# Patient Record
Sex: Female | Born: 1940 | Race: White | Hispanic: No | Marital: Married | Smoking: Former smoker
Health system: Southern US, Community
[De-identification: ages and names within clinical notes are randomized; demographics above are authoritative.]

## PROBLEM LIST (undated history)

## (undated) DIAGNOSIS — F329 Major depressive disorder, single episode, unspecified: Secondary | ICD-10-CM

## (undated) DIAGNOSIS — I509 Heart failure, unspecified: Secondary | ICD-10-CM

## (undated) DIAGNOSIS — M797 Fibromyalgia: Secondary | ICD-10-CM

## (undated) DIAGNOSIS — F32A Depression, unspecified: Secondary | ICD-10-CM

## (undated) DIAGNOSIS — Z9981 Dependence on supplemental oxygen: Secondary | ICD-10-CM

## (undated) DIAGNOSIS — K219 Gastro-esophageal reflux disease without esophagitis: Secondary | ICD-10-CM

## (undated) DIAGNOSIS — K222 Esophageal obstruction: Secondary | ICD-10-CM

## (undated) DIAGNOSIS — Z8719 Personal history of other diseases of the digestive system: Secondary | ICD-10-CM

## (undated) DIAGNOSIS — J449 Chronic obstructive pulmonary disease, unspecified: Secondary | ICD-10-CM

## (undated) HISTORY — PX: VARICOSE VEIN SURGERY: SHX832

## (undated) HISTORY — DX: Chronic obstructive pulmonary disease, unspecified: J44.9

## (undated) HISTORY — DX: Fibromyalgia: M79.7

## (undated) HISTORY — DX: Gastro-esophageal reflux disease without esophagitis: K21.9

## (undated) HISTORY — DX: Major depressive disorder, single episode, unspecified: F32.9

## (undated) HISTORY — DX: Esophageal obstruction: K22.2

## (undated) HISTORY — DX: Depression, unspecified: F32.A

## (undated) HISTORY — DX: Heart failure, unspecified: I50.9

---
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
## 1996-01-26 HISTORY — PX: CATARACT EXTRACTION: SUR2

## 1997-01-25 HISTORY — PX: OTHER SURGICAL HISTORY: SHX169

## 1999-01-26 HISTORY — PX: NEUROMA SURGERY: SHX722

## 2000-01-26 HISTORY — PX: OTHER SURGICAL HISTORY: SHX169

## 2001-01-25 HISTORY — PX: OTHER SURGICAL HISTORY: SHX169

## 2003-01-26 HISTORY — PX: OTHER SURGICAL HISTORY: SHX169

## 2003-07-16 ENCOUNTER — Other Ambulatory Visit: Payer: Self-pay

## 2004-04-23 ENCOUNTER — Ambulatory Visit: Payer: Self-pay

## 2005-04-30 ENCOUNTER — Ambulatory Visit: Payer: Self-pay

## 2005-05-26 ENCOUNTER — Ambulatory Visit: Payer: Self-pay

## 2005-07-15 ENCOUNTER — Ambulatory Visit: Payer: Self-pay

## 2007-01-09 ENCOUNTER — Other Ambulatory Visit: Payer: Self-pay

## 2007-01-09 ENCOUNTER — Ambulatory Visit: Payer: Self-pay | Admitting: Unknown Physician Specialty

## 2007-01-18 ENCOUNTER — Ambulatory Visit: Payer: Self-pay | Admitting: Physician Assistant

## 2007-01-24 ENCOUNTER — Ambulatory Visit: Payer: Self-pay | Admitting: Unknown Physician Specialty

## 2007-02-15 ENCOUNTER — Ambulatory Visit: Payer: Self-pay | Admitting: Unknown Physician Specialty

## 2007-02-21 ENCOUNTER — Encounter: Payer: Self-pay | Admitting: Unknown Physician Specialty

## 2007-02-26 ENCOUNTER — Encounter: Payer: Self-pay | Admitting: Unknown Physician Specialty

## 2007-03-16 ENCOUNTER — Ambulatory Visit: Payer: Self-pay | Admitting: Internal Medicine

## 2007-03-26 ENCOUNTER — Encounter: Payer: Self-pay | Admitting: Unknown Physician Specialty

## 2007-04-26 ENCOUNTER — Encounter: Payer: Self-pay | Admitting: Unknown Physician Specialty

## 2007-06-03 ENCOUNTER — Other Ambulatory Visit: Payer: Self-pay

## 2007-06-03 ENCOUNTER — Inpatient Hospital Stay: Payer: Self-pay | Admitting: Internal Medicine

## 2007-06-28 ENCOUNTER — Ambulatory Visit: Payer: Self-pay | Admitting: Family Medicine

## 2007-06-30 ENCOUNTER — Ambulatory Visit: Payer: Self-pay | Admitting: Nephrology

## 2007-07-06 ENCOUNTER — Ambulatory Visit: Payer: Self-pay | Admitting: Oncology

## 2007-07-19 LAB — IRON AND TIBC
%SAT: 22 % (ref 20–55)
Iron: 67 ug/dL (ref 42–145)
TIBC: 309 ug/dL (ref 250–470)
UIBC: 242 ug/dL

## 2007-07-19 LAB — VITAMIN B12: Vitamin B-12: 735 pg/mL (ref 211–911)

## 2007-07-19 LAB — KAPPA/LAMBDA LIGHT CHAINS: Lambda Free Lght Chn: 2.01 mg/dL (ref 0.57–2.63)

## 2007-07-21 LAB — UIFE/LIGHT CHAINS/TP QN, 24-HR UR
Alpha 2, Urine: DETECTED — AB
Beta, Urine: DETECTED — AB
Free Kappa Lt Chains,Ur: 5.84 mg/dL — ABNORMAL HIGH (ref 0.04–1.51)
Free Lambda Lt Chains,Ur: 0.64 mg/dL (ref 0.08–1.01)
Free Lt Chn Excr Rate: 75.92 mg/d
Gamma Globulin, Urine: DETECTED — AB
Total Protein, Urine-Ur/day: 90 mg/d (ref 10–140)
Volume, Urine: 1300 mL

## 2007-07-24 ENCOUNTER — Ambulatory Visit: Payer: Self-pay | Admitting: Oncology

## 2007-08-15 LAB — CBC WITH DIFFERENTIAL (CANCER CENTER ONLY)
Eosinophils Absolute: 0.2 10*3/uL (ref 0.0–0.5)
HGB: 14.8 g/dL (ref 11.6–15.9)
LYMPH#: 2.8 10*3/uL (ref 0.9–3.3)
MONO#: 0.7 10*3/uL (ref 0.1–0.9)
MONO%: 6.5 % (ref 0.0–13.0)
NEUT#: 6.3 10*3/uL (ref 1.5–6.5)
Platelets: 286 10*3/uL (ref 145–400)
RBC: 5.24 10*6/uL (ref 3.70–5.32)
WBC: 10 10*3/uL (ref 3.9–10.0)

## 2007-09-01 ENCOUNTER — Ambulatory Visit: Payer: Self-pay | Admitting: Oncology

## 2007-09-04 ENCOUNTER — Other Ambulatory Visit: Admission: RE | Admit: 2007-09-04 | Discharge: 2007-09-04 | Payer: Self-pay | Admitting: Oncology

## 2007-09-04 ENCOUNTER — Encounter: Payer: Self-pay | Admitting: Oncology

## 2007-09-04 LAB — CBC WITH DIFFERENTIAL (CANCER CENTER ONLY)
BASO#: 0.1 10*3/uL (ref 0.0–0.2)
BASO%: 1.3 % (ref 0.0–2.0)
EOS%: 2.8 % (ref 0.0–7.0)
HGB: 15 g/dL (ref 11.6–15.9)
LYMPH#: 2.3 10*3/uL (ref 0.9–3.3)
MCH: 28.2 pg (ref 26.0–34.0)
MCHC: 34.1 g/dL (ref 32.0–36.0)
MONO%: 7.7 % (ref 0.0–13.0)
NEUT#: 4.3 10*3/uL (ref 1.5–6.5)
NEUT%: 57.8 % (ref 39.6–80.0)
RDW: 15.9 % — ABNORMAL HIGH (ref 10.5–14.6)

## 2007-10-09 LAB — CBC WITH DIFFERENTIAL (CANCER CENTER ONLY)
BASO%: 0.9 % (ref 0.0–2.0)
EOS%: 3.3 % (ref 0.0–7.0)
LYMPH#: 3.3 10*3/uL (ref 0.9–3.3)
MCH: 29.4 pg (ref 26.0–34.0)
MCHC: 34.4 g/dL (ref 32.0–36.0)
MONO%: 7.5 % (ref 0.0–13.0)
NEUT#: 4.1 10*3/uL (ref 1.5–6.5)
Platelets: 267 10*3/uL (ref 145–400)
RBC: 5.1 10*6/uL (ref 3.70–5.32)

## 2007-10-11 ENCOUNTER — Ambulatory Visit: Payer: Self-pay | Admitting: Family Medicine

## 2007-11-04 ENCOUNTER — Ambulatory Visit: Payer: Self-pay | Admitting: Family Medicine

## 2007-11-21 ENCOUNTER — Encounter: Payer: Self-pay | Admitting: Neurosurgery

## 2007-11-26 ENCOUNTER — Encounter: Payer: Self-pay | Admitting: Neurosurgery

## 2007-12-28 ENCOUNTER — Encounter
Admission: RE | Admit: 2007-12-28 | Discharge: 2007-12-28 | Payer: Self-pay | Admitting: Physical Medicine and Rehabilitation

## 2008-01-26 HISTORY — PX: SHOULDER SURGERY: SHX246

## 2008-03-19 ENCOUNTER — Ambulatory Visit: Payer: Self-pay | Admitting: Neurosurgery

## 2008-03-29 ENCOUNTER — Ambulatory Visit: Payer: Self-pay | Admitting: Unknown Physician Specialty

## 2008-04-11 ENCOUNTER — Encounter: Admission: RE | Admit: 2008-04-11 | Discharge: 2008-04-11 | Payer: Self-pay | Admitting: Rheumatology

## 2008-05-01 ENCOUNTER — Ambulatory Visit: Payer: Self-pay | Admitting: Oncology

## 2008-05-03 LAB — CBC WITH DIFFERENTIAL (CANCER CENTER ONLY)
BASO#: 0.1 10*3/uL (ref 0.0–0.2)
BASO%: 1.2 % (ref 0.0–2.0)
EOS%: 2 % (ref 0.0–7.0)
HCT: 41 % (ref 34.8–46.6)
HGB: 13.9 g/dL (ref 11.6–15.9)
LYMPH#: 2.7 10*3/uL (ref 0.9–3.3)
LYMPH%: 30.4 % (ref 14.0–48.0)
MCH: 30.1 pg (ref 26.0–34.0)
MCHC: 33.8 g/dL (ref 32.0–36.0)
MCV: 89 fL (ref 81–101)
NEUT%: 59 % (ref 39.6–80.0)
RDW: 12.9 % (ref 10.5–14.6)

## 2008-05-03 LAB — CMP (CANCER CENTER ONLY)
ALT(SGPT): 16 U/L (ref 10–47)
Alkaline Phosphatase: 129 U/L — ABNORMAL HIGH (ref 26–84)
Creat: 0.8 mg/dl (ref 0.6–1.2)
Sodium: 138 mEq/L (ref 128–145)
Total Bilirubin: 0.5 mg/dl (ref 0.20–1.60)
Total Protein: 7 g/dL (ref 6.4–8.1)

## 2008-05-07 LAB — SPEP & IFE WITH QIG
Albumin ELP: 56.8 % (ref 55.8–66.1)
Alpha-2-Globulin: 13.7 % — ABNORMAL HIGH (ref 7.1–11.8)
Beta Globulin: 6.9 % (ref 4.7–7.2)
Total Protein, Serum Electrophoresis: 6.8 g/dL (ref 6.0–8.3)

## 2008-05-07 LAB — UIFE/LIGHT CHAINS/TP QN, 24-HR UR
Free Kappa Lt Chains,Ur: 2.92 mg/dL — ABNORMAL HIGH (ref 0.04–1.51)
Free Kappa/Lambda Ratio: 4.23 ratio — ABNORMAL HIGH (ref 0.46–4.00)
Free Lambda Excretion/Day: 4.49 mg/d
Free Lambda Lt Chains,Ur: 0.69 mg/dL (ref 0.08–1.01)
Time: 24 hours
Total Protein, Urine: 4.1 mg/dL
Volume, Urine: 650 mL

## 2008-06-03 ENCOUNTER — Encounter: Payer: Self-pay | Admitting: Rheumatology

## 2008-10-25 ENCOUNTER — Encounter: Payer: Self-pay | Admitting: Orthopaedic Surgery

## 2008-10-30 ENCOUNTER — Ambulatory Visit: Payer: Self-pay | Admitting: Gastroenterology

## 2008-11-25 ENCOUNTER — Encounter: Payer: Self-pay | Admitting: Orthopaedic Surgery

## 2008-12-25 ENCOUNTER — Encounter: Payer: Self-pay | Admitting: Orthopaedic Surgery

## 2009-01-25 ENCOUNTER — Encounter: Payer: Self-pay | Admitting: Orthopaedic Surgery

## 2009-04-02 ENCOUNTER — Ambulatory Visit: Payer: Self-pay | Admitting: Physician Assistant

## 2009-04-25 ENCOUNTER — Ambulatory Visit: Payer: Self-pay | Admitting: Oncology

## 2009-06-13 ENCOUNTER — Ambulatory Visit: Payer: Self-pay | Admitting: Oncology

## 2009-06-19 LAB — CBC WITH DIFFERENTIAL/PLATELET
Eosinophils Absolute: 0.2 10*3/uL (ref 0.0–0.5)
LYMPH%: 29.9 % (ref 14.0–49.7)
MONO#: 0.7 10*3/uL (ref 0.1–0.9)
NEUT#: 5.5 10*3/uL (ref 1.5–6.5)
Platelets: 265 10*3/uL (ref 145–400)
RBC: 5.23 10*6/uL (ref 3.70–5.45)
RDW: 15.1 % — ABNORMAL HIGH (ref 11.2–14.5)
WBC: 9.2 10*3/uL (ref 3.9–10.3)
lymph#: 2.8 10*3/uL (ref 0.9–3.3)

## 2009-06-19 LAB — CMP (CANCER CENTER ONLY)
ALT(SGPT): 23 U/L (ref 10–47)
CO2: 19 mEq/L (ref 18–33)
Calcium: 9.2 mg/dL (ref 8.0–10.3)
Chloride: 102 mEq/L (ref 98–108)
Potassium: 3.8 mEq/L (ref 3.3–4.7)
Sodium: 134 mEq/L (ref 128–145)
Total Protein: 7.2 g/dL (ref 6.4–8.1)

## 2009-06-24 LAB — SPEP & IFE WITH QIG
Alpha-2-Globulin: 10.9 % (ref 7.1–11.8)
IgG (Immunoglobin G), Serum: 1180 mg/dL (ref 694–1618)
Total Protein, Serum Electrophoresis: 7 g/dL (ref 6.0–8.3)

## 2009-06-24 LAB — BETA 2 MICROGLOBULIN, SERUM: Beta-2 Microglobulin: 2.12 mg/L — ABNORMAL HIGH (ref 1.01–1.73)

## 2009-06-26 LAB — UIFE/LIGHT CHAINS/TP QN, 24-HR UR
Albumin, U: DETECTED
Free Kappa/Lambda Ratio: 8.6 ratio — ABNORMAL HIGH (ref 0.46–4.00)
Free Lambda Excretion/Day: 8.42 mg/d
Free Lambda Lt Chains,Ur: 0.91 mg/dL (ref 0.08–1.01)
Time: 24 hours
Total Protein, Urine-Ur/day: 89 mg/d (ref 10–140)
Total Protein, Urine: 9.6 mg/dL
Volume, Urine: 925 mL

## 2009-09-10 ENCOUNTER — Ambulatory Visit: Payer: Self-pay | Admitting: Gastroenterology

## 2009-09-12 LAB — PATHOLOGY REPORT

## 2010-05-19 ENCOUNTER — Ambulatory Visit: Payer: Self-pay | Admitting: Family Medicine

## 2010-05-22 ENCOUNTER — Ambulatory Visit: Payer: Self-pay | Admitting: Family Medicine

## 2010-10-23 LAB — CHROMOSOME ANALYSIS, BONE MARROW

## 2010-10-23 LAB — BONE MARROW EXAM

## 2010-10-23 LAB — TISSUE HYBRIDIZATION (BONE MARROW)-NCBH

## 2010-12-02 ENCOUNTER — Ambulatory Visit: Payer: Self-pay | Admitting: Gastroenterology

## 2010-12-07 ENCOUNTER — Ambulatory Visit: Payer: Self-pay | Admitting: Family Medicine

## 2011-05-04 ENCOUNTER — Ambulatory Visit: Payer: Self-pay

## 2011-05-11 ENCOUNTER — Ambulatory Visit: Payer: Self-pay | Admitting: Family Medicine

## 2011-06-07 ENCOUNTER — Encounter: Payer: Self-pay | Admitting: Emergency Medicine

## 2011-06-08 ENCOUNTER — Encounter: Payer: Self-pay | Admitting: Emergency Medicine

## 2011-06-08 ENCOUNTER — Ambulatory Visit (INDEPENDENT_AMBULATORY_CARE_PROVIDER_SITE_OTHER): Payer: BC Managed Care – PPO | Admitting: Emergency Medicine

## 2011-06-08 ENCOUNTER — Ambulatory Visit (INDEPENDENT_AMBULATORY_CARE_PROVIDER_SITE_OTHER)
Admission: RE | Admit: 2011-06-08 | Discharge: 2011-06-08 | Disposition: A | Discharge status: Home / Self Care | Payer: BC Managed Care – PPO | Source: Ambulatory Visit | Attending: Emergency Medicine | Admitting: Emergency Medicine

## 2011-06-08 VITALS — BP 154/76 | HR 92 | Temp 98.6°F | Ht 59.0 in | Wt 149.2 lb

## 2011-06-08 DIAGNOSIS — R0609 Other forms of dyspnea: Secondary | ICD-10-CM

## 2011-06-08 NOTE — Patient Instructions (Signed)
We will start Spiriva once daily Continue to take your albuterol 4x a day for now. We may change this in the future We will perform pulmonary testing at [facility]  We will arrange for you to follow up with Dr Kendrick Fries in [place] (30 minute appointment please)

## 2011-06-08 NOTE — Assessment & Plan Note (Signed)
Suspect this is largely due to COPD with associated hypoxemia. She carries a hx of "CHF" and had a CXR 04/2011 at [facility] that has ? ILD. Significance of these factors is unclear. Certainly we can do more for her w regard to her BD's. Important issue will be wearing O2 - she is reluctant to do so. - Start Spiriva and give samples - CXR today, doubt significant ILD based on clear exam - she needs to consider ordering O2, will have her readdress next visit - wants to follow up in [place], I will set her PFT at [facility] and have her see Dr Kendrick Fries.

## 2011-06-08 NOTE — Progress Notes (Signed)
  Subjective:    Patient ID: Kathryn Ruiz, female    DOB: July 13, 1940, 72 y.o.   MRN: [id number]  HPI 62 former heavy smoker (150pk-yrs), hx GERD, COPD, ? HTN and CHF (per notes). She is referred by Dr Venora Maples for dyspnea with exertion, cough (often affected by GERD). A CXR performed 05/11/11 at [place] showed "B diffuse interstitial thickening."  She has had 2-3 exacerbations in the past, last one was 04/2011. She follows with ENT Dr Jenne Campus for laryngitis, voice hoarseness. Has been on pulmicort + albuterol 4x a day. No PFT's. She is hypoxic on presentation today, she has been given O2 before but has refused it, turned it back in.   Review of Systems  Constitutional: Negative for fever and unexpected weight change.  HENT: Positive for congestion, sore throat, trouble swallowing, postnasal drip and sinus pressure. Negative for ear pain, nosebleeds, rhinorrhea, sneezing and dental problem.   Eyes: Negative for redness and itching.  Respiratory: Positive for cough, shortness of breath and wheezing. Negative for chest tightness.   Cardiovascular: Negative for palpitations and leg swelling.  Gastrointestinal: Negative for nausea and vomiting.  Genitourinary: Negative for dysuria.  Musculoskeletal: Positive for joint swelling.  Skin: Negative for rash.  Neurological: Positive for headaches.  Hematological: Bruises/bleeds easily.  Psychiatric/Behavioral: Negative for dysphoric mood. The patient is nervous/anxious.     Past Medical History  Diagnosis Date  . Fibromyalgia   . GERD (gastroesophageal reflux disease)   . COPD (chronic obstructive pulmonary disease)   . CHF (congestive heart failure)      Family History  Problem Relation Age of Onset  . Heart attack Father 34     History   Social History  . Marital Status: Married    Spouse Name: N/A    Number of Children: 5  . Years of Education: N/A   Occupational History  . Retired     Electrical engineer   Social History Main  Topics  . Smoking status: Former Smoker -- 3.0 packs/day for 50 years    Types: Cigarettes    Quit date: 01/26/1999  . Smokeless tobacco: Never Used  . Alcohol Use: No  . Drug Use: No  . Sexually Active: Not on file   Other Topics Concern  . Not on file   Social History Narrative  . No narrative on file     No Known Allergies   Outpatient Prescriptions Prior to Visit  Medication Sig Dispense Refill  . budesonide (PULMICORT) 180 MCG/ACT inhaler Inhale 2 puffs into the lungs 2 (two) times daily.      Marland Kitchen FLUoxetine (PROZAC) 20 MG tablet Take 20 mg by mouth daily.           Objective:   Physical Exam  Gen: Pleasant, well-nourished, in no distress,  normal affect  ENT: No lesions,  mouth clear,  oropharynx clear, no postnasal drip  Neck: No JVD, no TMG, no carotid bruits  Lungs: No use of accessory muscles, no dullness to percussion, clear without rales or rhonchi  Cardiovascular: RRR, heart sounds normal, no murmur or gallops, no peripheral edema  Musculoskeletal: No deformities, no cyanosis or clubbing  Neuro: alert, non focal  Skin: Warm, no lesions or rashes     Assessment & Plan:

## 2011-06-10 ENCOUNTER — Ambulatory Visit: Payer: Self-pay

## 2011-06-10 LAB — PULMONARY FUNCTION TEST

## 2011-07-09 ENCOUNTER — Encounter: Payer: Self-pay | Admitting: Pulmonary Disease

## 2011-07-09 ENCOUNTER — Ambulatory Visit (INDEPENDENT_AMBULATORY_CARE_PROVIDER_SITE_OTHER): Payer: BC Managed Care – PPO | Admitting: Pulmonary Disease

## 2011-07-09 VITALS — BP 126/72 | HR 85 | Temp 98.4°F | Ht 59.0 in | Wt 148.0 lb

## 2011-07-09 DIAGNOSIS — J449 Chronic obstructive pulmonary disease, unspecified: Secondary | ICD-10-CM

## 2011-07-09 DIAGNOSIS — J8410 Pulmonary fibrosis, unspecified: Secondary | ICD-10-CM | POA: Insufficient documentation

## 2011-07-09 NOTE — Patient Instructions (Signed)
Use 2 liters of oxygen when you walk on a regular basis. We will order an overnight oxygen saturation test to see if you need oxygen at night Keep taking spiriva. Get a flu shot every year. We will send you for a CT scan of your chest at [facility] to evaluate your pulmonary fibrosis You need to see your gastroenterologist again to evaluate your esophagus Take your prilosec twice per day. Follow the GERD diet recommendations we gave you in clinic. We will see you back in one month, overbook OK.

## 2011-07-09 NOTE — Assessment & Plan Note (Signed)
COPD: GOLD Grade A Combined recommendations from the [employer]	[place], [employer], Designer, television/film set, European Respiratory Society (Qaseem A et al, Ann Intern Med. 2011;155(3):179) recommends tobacco cessation, pulmonary rehab (for symptomatic patients with an FEV1 < 50% predicted), supplemental oxygen (for patients with SaO2 <88% or paO2 <55), and appropriate bronchodilator therapy.  In regards to long acting bronchodilators, they recommend monotherapy (FEV1 60-80% with symptoms weak evidence, FEV1 with symptoms <60% strong evidence), or combination therapy (FEV1 <60% with symptoms, strong recommendation, moderate evidence).  One should also provide patients with annual immunizations and consider therapy for prevention of COPD exacerbations (ie. roflumilast or azithromycin) when appopriate.  -O2 therapy: started 3 L with exercise and ordered overnight sleep study -Immunizations: UTD -Tobacco use: non smoker -Exercise: encouraged to exercise at length -Bronchodilator therapy: continue spiriva -Exacerbation prevention: spiriva

## 2011-07-09 NOTE — Progress Notes (Signed)
Subjective:    Patient ID: Kathryn Ruiz, female    DOB: Aug 31, 1940, 71 y.o.   MRN: [id number]  HPI 07/09/2011 F/U Kathryn Ruiz -- Kathryn Ruiz is a 71 y/o female with a strong smoking history who saw Dr. Delton Coombes a few weeks ago for shortness of breath.  She states that [facility] Delton Coombes for ten or more years of dyspnea with associated cough.  She states that when she was a smoker (3ppd for 50 years) she actually felt a little better because she could cough out her phlegm.  Now she can't so she feels congested a lot.  She states that she notes severe acid reflux symptoms and often feels that chokes on food despite having an esophageal balloon dilation several months ago.  She says that the Spiriva is quite helpful, despite it's high cost.    Past Medical History  Diagnosis Date  . Fibromyalgia   . GERD (gastroesophageal reflux disease)   . COPD (chronic obstructive pulmonary disease)   . CHF (congestive heart failure)      Family History  Problem Relation Age of Onset  . Heart attack Father 30     History   Social History  . Marital Status: Married    Spouse Name: N/A    Number of Children: 5  . Years of Education: N/A   Occupational History  . Retired     Electrical engineer   Social History Main Topics  . Smoking status: Former Smoker -- 3.0 packs/day for 50 years    Types: Cigarettes    Quit date: 01/26/1999  . Smokeless tobacco: Never Used  . Alcohol Use: No  . Drug Use: No  . Sexually Active: Not on file   Other Topics Concern  . Not on file   Social History Narrative  . No narrative on file     No Known Allergies   Outpatient Prescriptions Prior to Visit  Medication Sig Dispense Refill  . albuterol (VENTOLIN HFA) 108 (90 BASE) MCG/ACT inhaler Inhale 2 puffs into the lungs 4 (four) times daily.      Marland Kitchen amitriptyline (ELAVIL) 10 MG tablet Take 10 mg by mouth at bedtime.      Marland Kitchen FLUoxetine (PROZAC) 20 MG tablet Take 20 mg by mouth daily.        Marland Kitchen omeprazole (PRILOSEC) 20 MG capsule Take 20 mg by mouth daily.      . traMADol (ULTRAM) 50 MG tablet Take 50 mg by mouth every 6 (six) hours as needed.      . budesonide (PULMICORT) 180 MCG/ACT inhaler Inhale 2 puffs into the lungs 2 (two) times daily.          Review of Systems  Constitutional: Negative for fever, chills and unexpected weight change.  HENT: Positive for congestion, sore throat, dental problem and sinus pressure. Negative for ear pain, nosebleeds, rhinorrhea, sneezing, trouble swallowing, voice change and postnasal drip.   Eyes: Negative for visual disturbance.  Respiratory: Positive for cough and shortness of breath. Negative for choking.   Cardiovascular: Negative for chest pain and leg swelling.  Gastrointestinal: Negative for vomiting, abdominal pain and diarrhea.  Genitourinary: Negative for difficulty urinating.  Musculoskeletal: Positive for arthralgias.  Skin: Negative for rash.  Neurological: Positive for headaches. Negative for tremors and syncope.  Hematological: Does not bruise/bleed easily.       Objective:   Physical Exam  Filed Vitals:   07/09/11 1323 07/09/11 1328  BP: 126/72  Pulse: 85   Temp: 98.4 F (36.9 C)   TempSrc: Oral   Height: 4\' 11"  (1.499 m)   Weight: 148 lb (67.132 kg)   SpO2: 91% 85%   Gen: chronically ill appearing, no acute distress HEENT: NCAT, PERRL, EOMi, OP clear, neck supple without masses PULM: Inspiratory crackles in bases bilaterally CV: RRR, systolic murmur noted, no JVD AB: BS+, soft, nontender, no hsm Ext: warm, trace edema, no clubbing, no cyanosis Derm: no rash or skin breakdown Neuro: A&Ox4, CN II-XII intact, strength 5/5 in all 4 extremities  05/2011 PFT [facility] >> Ratio 61%, FEV1 1.43 L 91% pred TLC 4.05 L 106% pred DLCO 41% pred 05/2011 CXR >> emphysema with lower lobe fibrosis    Assessment & Plan:   COPD (chronic obstructive pulmonary disease) COPD: GOLD Grade A Combined recommendations from the  [employer], [employer] of Chest Physicians, Designer, television/film set, European Respiratory Society (Qaseem A et al, Ann Intern Med. 2011;155(3):179) recommends tobacco cessation, pulmonary rehab (for symptomatic patients with an FEV1 < 50% predicted), supplemental oxygen (for patients with SaO2 <88% or paO2 <55), and appropriate bronchodilator therapy.  In regards to long acting bronchodilators, they recommend monotherapy (FEV1 60-80% with symptoms weak evidence, FEV1 with symptoms <60% strong evidence), or combination therapy (FEV1 <60% with symptoms, strong recommendation, moderate evidence).  One should also provide patients with annual immunizations and consider therapy for prevention of COPD exacerbations (ie. roflumilast or azithromycin) when appopriate.  -O2 therapy: started 3 L with exercise and ordered overnight sleep study -Immunizations: UTD -Tobacco use: non smoker -Exercise: encouraged to exercise at length -Bronchodilator therapy: continue spiriva -Exacerbation prevention: spiriva   Pulmonary fibrosis I explained to Kathryn Ruiz that her low DLCO, CXR findings, and exam findings are worrisome for diffuse parenchymal lung disease.  DDx includes chronic aspiration (high risk given esophageal problems over the years) vs. UIP, NSIP.  She is interested in "living as long as possible" and is willing to undergo a work up.   Plan: -I advised that she see her gastroenterologist again so that she can discuss the esophageal dysfunction issues.  She notes that food is still getting stuck occasionally.  I think that aspiration could be causing her lung disease -Will check serologies for possible connective tissue diseases -CT thorax at [facility] for further evaluation   Updated Medication List Outpatient Encounter Prescriptions as of 07/09/2011  Medication Sig Dispense Refill  . albuterol (VENTOLIN HFA) 108 (90 BASE) MCG/ACT inhaler Inhale 2 puffs into the lungs 4 (four)  times daily.      Marland Kitchen amitriptyline (ELAVIL) 10 MG tablet Take 10 mg by mouth at bedtime.      Marland Kitchen FLUoxetine (PROZAC) 20 MG tablet Take 20 mg by mouth daily.      Marland Kitchen omeprazole (PRILOSEC) 20 MG capsule Take 20 mg by mouth daily.      Marland Kitchen tiotropium (SPIRIVA) 18 MCG inhalation capsule Place 18 mcg into inhaler and inhale daily.      . traMADol (ULTRAM) 50 MG tablet Take 50 mg by mouth every 6 (six) hours as needed.      Marland Kitchen DISCONTD: budesonide (PULMICORT) 180 MCG/ACT inhaler Inhale 2 puffs into the lungs 2 (two) times daily.

## 2011-07-09 NOTE — Assessment & Plan Note (Signed)
I explained to Kathryn Ruiz that her low DLCO, CXR findings, and exam findings are worrisome for diffuse parenchymal lung disease.  DDx includes chronic aspiration (high risk given esophageal problems over the years) vs. UIP, NSIP.  She is interested in "living as long as possible" and is willing to undergo a work up.   Plan: -I advised that she see her gastroenterologist again so that she can discuss the esophageal dysfunction issues.  She notes that food is still getting stuck occasionally.  I think that aspiration could be causing her lung disease -Will check serologies for possible connective tissue diseases -CT thorax at [facility] for further evaluation

## 2011-07-12 ENCOUNTER — Other Ambulatory Visit (INDEPENDENT_AMBULATORY_CARE_PROVIDER_SITE_OTHER): Payer: BC Managed Care – PPO | Admitting: *Deleted

## 2011-07-12 DIAGNOSIS — J8410 Pulmonary fibrosis, unspecified: Secondary | ICD-10-CM

## 2011-07-12 DIAGNOSIS — J449 Chronic obstructive pulmonary disease, unspecified: Secondary | ICD-10-CM

## 2011-07-13 LAB — ANTI-SCLERODERMA ANTIBODY: Scleroderma (Scl-70) (ENA) Antibody, IgG: <1 AU/mL (ref <30)

## 2011-07-13 LAB — SJOGREN'S SYNDROME ANTIBODS(SSA + SSB): SSB (La) (ENA) Antibody, IgG: <1 AU/mL (ref <30)

## 2011-07-13 LAB — ANA: Anti Nuclear Antibody(ANA): NEGATIVE

## 2011-07-14 ENCOUNTER — Ambulatory Visit: Payer: Self-pay | Admitting: Pulmonary Disease

## 2011-08-02 ENCOUNTER — Encounter: Payer: Self-pay | Admitting: Pulmonary Disease

## 2011-08-02 ENCOUNTER — Telehealth: Payer: Self-pay | Admitting: Pulmonary Disease

## 2011-08-02 ENCOUNTER — Telehealth: Payer: Self-pay | Admitting: *Deleted

## 2011-08-02 MED ORDER — TIOTROPIUM BROMIDE MONOHYDRATE 18 MCG IN CAPS: 18.0000 ug | ORAL_CAPSULE | Freq: Every day | RESPIRATORY_TRACT | Status: DC

## 2011-08-02 NOTE — Telephone Encounter (Signed)
I spoke with pt and is aware rx for spiriva has been sent to the pharmacy. Nothing further was needed

## 2011-08-02 NOTE — Telephone Encounter (Signed)
Message copied by Christen Butter on Mon Aug 02, 2011  4:46 PM ------      Message from: Veto Kemps B      Created: Mon Aug 02, 2011  4:43 PM       L,            Please let her know that I saw her CT scan it did not show anything unexpected (mostly emphysema and I did not see that much fibrosis).              B

## 2011-08-02 NOTE — Telephone Encounter (Signed)
Spoke with pt and notified of results per Dr.McQuaid. Pt verbalized understanding and denied any questions. 

## 2011-08-03 ENCOUNTER — Telehealth: Payer: Self-pay | Admitting: *Deleted

## 2011-08-03 DIAGNOSIS — G4734 Idiopathic sleep related nonobstructive alveolar hypoventilation: Secondary | ICD-10-CM

## 2011-08-03 NOTE — Telephone Encounter (Signed)
Message copied by Christen Butter on Tue Aug 03, 2011  1:14 PM ------      Message from: Veto Kemps B      Created: Mon Aug 02, 2011  5:34 PM       L,            She had a positive ONO and needs o2 at night.  Can we set that up.  I guess 2 liters continuously, but then won't she need a follow up test?            B

## 2011-08-03 NOTE — Telephone Encounter (Signed)
Yes- we can set her up on the 2 lpm and then retest on this to be sure 2 lpm is enough.  LMTCB for pt

## 2011-08-05 NOTE — Telephone Encounter (Signed)
Spoke with pt and notified of results/recs per BQ. She verbalized understanding and denied any questions. Will send order to [facility] to start o2 at hs 2lpm and re check ono. Order sent to [facility].

## 2011-08-06 ENCOUNTER — Other Ambulatory Visit: Payer: Self-pay | Admitting: Pulmonary Disease

## 2011-08-06 ENCOUNTER — Ambulatory Visit (INDEPENDENT_AMBULATORY_CARE_PROVIDER_SITE_OTHER): Payer: BC Managed Care – PPO | Admitting: Pulmonary Disease

## 2011-08-06 ENCOUNTER — Encounter: Payer: Self-pay | Admitting: Pulmonary Disease

## 2011-08-06 VITALS — BP 124/60 | HR 82 | Temp 98.1°F | Ht 59.0 in | Wt 149.1 lb

## 2011-08-06 DIAGNOSIS — J449 Chronic obstructive pulmonary disease, unspecified: Secondary | ICD-10-CM

## 2011-08-06 DIAGNOSIS — J9611 Chronic respiratory failure with hypoxia: Secondary | ICD-10-CM

## 2011-08-06 DIAGNOSIS — J8410 Pulmonary fibrosis, unspecified: Secondary | ICD-10-CM

## 2011-08-06 DIAGNOSIS — J9610 Chronic respiratory failure, unspecified whether with hypoxia or hypercapnia: Secondary | ICD-10-CM

## 2011-08-06 DIAGNOSIS — F22 Delusional disorders: Secondary | ICD-10-CM

## 2011-08-06 NOTE — Patient Instructions (Signed)
We will refer you to pulmonary rehab We will let you know the results of your labwork We will see you back in 1-2 months after you see your gastroenterologist Use your oxygen when you exert yourself

## 2011-08-06 NOTE — Assessment & Plan Note (Signed)
She has not been very compliant with this.  Plan: -Continue 3 L of oxygen with exertion -I stressed the importance of using her oxygen during this visit.

## 2011-08-06 NOTE — Assessment & Plan Note (Signed)
After reviewing her CT scan is clear the emphysema is truly the biggest cause of her shortness of breath. However she does have some fibrosis in the bases which I think makes her DLCO so low. Again is uncertain to me exactly what type of fibrosis she may have but aspiration is a concern given her esophageal issues.  Plan: -Advised at length that she needs to schedule an appointment with her gastroenterologist to discuss her swallowing trouble -Sent pulmonary fibrosis lab panel -Followup with me after the GI appointment -Pulmonary rehabilitation referral was 6 minute walk -Consider inferior treatment if no improvement after pulmonary rehabilitation or if labs are suggestive of a connective tissue disease process

## 2011-08-06 NOTE — Assessment & Plan Note (Signed)
Continue Spiriva Encouraged use oxygen 3 L continuously with exercise Pulmonary rehabilitation referral

## 2011-08-06 NOTE — Progress Notes (Signed)
Subjective:    Patient ID: Kathryn Ruiz, female    DOB: Apr 27, 1940, 71 y.o.   MRN: [id number]  Synopsis: Kathryn Ruiz was first followed by Dr. Delton Coombes in the winter of 2013 the [facility] office and then came to the [facility] office in June 2013 for evaluation of COPD and pulmonary fibrosis. She smoked 3 packs a day for 50 years and quit in 2001. She has a history of esophageal dysphagia requiring balloon dilation on multiple times in the past.  HPI  07/09/2011 F/U McQuaid -- Kathryn Ruiz is a 71 y/o female with a strong smoking history who saw Dr. Delton Coombes a few weeks ago for shortness of breath.  She states that she was referred by [facility] to Dr. Delton Coombes for ten or more years of dyspnea with associated cough.  She states that when she was a smoker (3ppd for 50 years) she actually felt a little better because she could cough out her phlegm.  Now she can't so she feels congested a lot.  She states that she notes severe acid reflux symptoms and often feels that chokes on food despite having an esophageal balloon dilation several months ago.  She says that the Spiriva is quite helpful, despite it's high cost.    08/06/11 ROV --Kathryn Ruiz states that she has been using her oxygen and it makes her feel little bit better. However she does not like carrying the portable machine around so she's not always using it when she exerts her self. She still has good relief from the Spiriva. Cough is minimal. She has not scheduled an appointment with gastroenterology because she didn't have to pay the co-pay.  Past Medical History  Diagnosis Date  . Fibromyalgia   . GERD (gastroesophageal reflux disease)   . COPD (chronic obstructive pulmonary disease)   . CHF (congestive heart failure)      Family History  Problem Relation Age of Onset  . Heart attack Father 11       Review of Systems  Constitutional: Negative for fever, chills and unexpected weight change.  HENT: Positive for congestion. Negative  for nosebleeds, rhinorrhea, sneezing and postnasal drip.   Respiratory: Positive for shortness of breath. Negative for cough and choking.   Cardiovascular: Negative for chest pain and leg swelling.       Objective:   Physical Exam   Filed Vitals:   08/06/11 1432  BP: 124/60  Pulse: 82  Temp: 98.1 F (36.7 C)  TempSrc: Oral  Height: 4\' 11"  (1.499 m)  Weight: 149 lb 1.9 oz (67.64 kg)  SpO2: 94%   Gen: chronically ill appearing, no acute distress HEENT: NCAT, PERRL, EOMi, OP clear, neck supple without masses PULM: CTA B CV: RRR, systolic murmur noted, no JVD AB: BS+, soft, nontender, no hsm Ext: warm, trace edema, no clubbing, no cyanosis  05/2011 PFT [facility] >> Ratio 61%, FEV1 1.43 L 91% pred TLC 4.05 L 106% pred DLCO 41% pred 05/2011 CXR >> emphysema with lower lobe fibrosis 06/2011 CT [facility] >> massive emphysema, some fibrosis in lower lobes bilaterally    Assessment & Plan:   COPD (chronic obstructive pulmonary disease) Continue Spiriva Encouraged use oxygen 3 L continuously with exercise Pulmonary rehabilitation referral   Pulmonary fibrosis After reviewing her CT scan is clear the emphysema is truly the biggest cause of her shortness of breath. However she does have some fibrosis in the bases which I think makes her DLCO so low. Again is uncertain to me exactly what  type of fibrosis she may have but aspiration is a concern given her esophageal issues.  Plan: -Advised at length that she needs to schedule an appointment with her gastroenterologist to discuss her swallowing trouble -Sent pulmonary fibrosis lab panel -Followup with me after the GI appointment -Pulmonary rehabilitation referral was 6 minute walk -Consider inferior treatment if no improvement after pulmonary rehabilitation or if labs are suggestive of a connective tissue disease process  Chronic hypoxemic respiratory failure She has not been very compliant with this.  Plan: -Continue 3 L of oxygen with  exertion -I stressed the importance of using her oxygen during this visit.    Updated Medication List Outpatient Encounter Prescriptions as of 08/06/2011  Medication Sig Dispense Refill  . albuterol (VENTOLIN HFA) 108 (90 BASE) MCG/ACT inhaler Inhale 2 puffs into the lungs 4 (four) times daily.      Marland Kitchen amitriptyline (ELAVIL) 10 MG tablet Take 10 mg by mouth at bedtime.      Marland Kitchen FLUoxetine (PROZAC) 20 MG tablet Take 20 mg by mouth daily.      Marland Kitchen omeprazole (PRILOSEC) 20 MG capsule Take 20 mg by mouth daily.      Marland Kitchen tiotropium (SPIRIVA) 18 MCG inhalation capsule Place 1 capsule (18 mcg total) into inhaler and inhale daily.  30 capsule  6  . traMADol (ULTRAM) 50 MG tablet Take 50 mg by mouth every 6 (six) hours as needed.

## 2011-08-07 LAB — C-REACTIVE PROTEIN: CRP: <0.5 mg/dL (ref <0.60)

## 2011-08-08 LAB — ALDOLASE: Aldolase: 5.4 U/L (ref <=8.1)

## 2011-08-09 LAB — ANA: Anti Nuclear Antibody(ANA): NEGATIVE

## 2011-08-09 LAB — SEDIMENTATION RATE

## 2011-08-10 ENCOUNTER — Encounter: Payer: Self-pay | Admitting: Emergency Medicine

## 2011-08-10 ENCOUNTER — Encounter: Payer: Self-pay | Admitting: Pulmonary Disease

## 2011-08-12 NOTE — Progress Notes (Signed)
Quick Note:  Spoke with pt and notified of results per Dr. Wert. Pt verbalized understanding and denied any questions.  ______ 

## 2011-08-17 ENCOUNTER — Telehealth: Payer: Self-pay | Admitting: *Deleted

## 2011-08-17 ENCOUNTER — Telehealth: Payer: Self-pay | Admitting: Pulmonary Disease

## 2011-08-17 DIAGNOSIS — G4734 Idiopathic sleep related nonobstructive alveolar hypoventilation: Secondary | ICD-10-CM

## 2011-08-17 NOTE — Telephone Encounter (Signed)
Called, spoke with pt.  She would like to know if wearing a mask at bedtime would be an option instead of the nasal cannula.  States she has sores in her nose from blowing it all the time.  The nasal cannula seems to be irritating these.  Dr. Kendrick Fries, pls advise. Thank you

## 2011-08-17 NOTE — Telephone Encounter (Signed)
Spoke with pt and notified of results/recs per BQ. She verbalized understanding and order was sent to [facility] for ONO on 4lpm.

## 2011-08-17 NOTE — Telephone Encounter (Signed)
I don't think that there is a mask that would provide her the oxygen that she needs adequately.  Her needs aren't great enough to give her a venturi mask, and if we gave her that it would give her too much oxygen which could be dangerous.  Ask her if she has humidified oxygen, maybe that will help.  Also saline gel (can buy OTC from pharmacy) applied to each nostril at night can help.

## 2011-08-17 NOTE — Telephone Encounter (Signed)
Message copied by Christen Butter on Tue Aug 17, 2011  9:06 AM ------      Message from: Veto Kemps B      Created: Mon Aug 16, 2011  5:39 PM       L,            Ms. Henckel's ONO on 2 L at night showed persistent desaturation.            Can we have her use 4L/min at night and repeat an ONO?            Thanks,      B

## 2011-08-18 ENCOUNTER — Telehealth: Payer: Self-pay | Admitting: Pulmonary Disease

## 2011-08-18 DIAGNOSIS — J449 Chronic obstructive pulmonary disease, unspecified: Secondary | ICD-10-CM

## 2011-08-18 NOTE — Telephone Encounter (Signed)
Yes

## 2011-08-18 NOTE — Telephone Encounter (Signed)
Pt says she is not sure if her concentrator has humidity or not but will try the nasal gel first to see if this helps with the dryness. After having repeat ONO she will call if she still has any issues with dryness. She did not want humidity ordered at this time. I will close the msg and forward back to Dr. Kendrick Fries so he is aware.

## 2011-08-18 NOTE — Telephone Encounter (Signed)
Dr. Kendrick Fries, can we send order to [facility] for humidifier to be added to O2? Please advise thank!

## 2011-08-18 NOTE — Telephone Encounter (Signed)
noted 

## 2011-08-19 ENCOUNTER — Telehealth: Payer: Self-pay | Admitting: Pulmonary Disease

## 2011-08-19 DIAGNOSIS — J449 Chronic obstructive pulmonary disease, unspecified: Secondary | ICD-10-CM

## 2011-08-19 NOTE — Telephone Encounter (Signed)
Noted  

## 2011-08-19 NOTE — Telephone Encounter (Signed)
Order has been sent and the pt notified.

## 2011-08-19 NOTE — Telephone Encounter (Signed)
We have already rec saline gel prn and also have sent order to humidified o2. ? Has she tried either of these recs?  LMTCB for [employer]

## 2011-08-20 NOTE — Telephone Encounter (Signed)
I spoke with the pt daughter and she states they have not received the humidifier for the pt oxygen, so I advised I will call [facility] and check on this. She also states that the portable system the pt currently has is too large and she was told that [facility] needs an order to evaluate the pt for a smaller unit. I advised we can send this order as well. She states she just bought the saline gel and the pt has not tried this yet.  I called [facility] and spoke with Kendal Hymen and she states that they have the order but that it takes 5-7 business days to ship the bottles for humidifier. Pt daughter is aware.   Order palced to eval pt for a smaller portable system. Carron Curie, CMA

## 2011-08-30 ENCOUNTER — Ambulatory Visit: Payer: Self-pay | Admitting: Gastroenterology

## 2011-08-31 ENCOUNTER — Telehealth: Payer: Self-pay | Admitting: Pulmonary Disease

## 2011-08-31 DIAGNOSIS — J449 Chronic obstructive pulmonary disease, unspecified: Secondary | ICD-10-CM

## 2011-08-31 NOTE — Telephone Encounter (Signed)
I spoke with pt and she is requesting her ONO results on 4 liters than was done last week. As well as wanting to know if Dr. Kendrick Fries received her barium swallow test results her GI doctor did. She states it came back that she had mild dysphagia. Please advise Dr. Kendrick Fries thanks

## 2011-08-31 NOTE — Telephone Encounter (Signed)
We don't have it yet, but will check again in the morning and contact her tomorrow if we can get the results.

## 2011-09-01 NOTE — Telephone Encounter (Addendum)
ONO is fine on 4 lpm- per Dr. Kendrick Fries, will send order to DME.  Swallow study ordered by GI was reviewed by Dr. Kendrick Fries and is normal  Will contact the pt with this info tomorrow.

## 2011-09-01 NOTE — Telephone Encounter (Signed)
Spoke with pt and notified of results per Dr.McQuaid. Pt verbalized understanding and denied any questions. 

## 2011-09-02 ENCOUNTER — Ambulatory Visit: Payer: BC Managed Care – PPO | Admitting: Pulmonary Disease

## 2011-09-14 ENCOUNTER — Encounter: Payer: Self-pay | Admitting: Pulmonary Disease

## 2011-09-26 ENCOUNTER — Encounter: Payer: Self-pay | Admitting: Pulmonary Disease

## 2011-09-29 ENCOUNTER — Ambulatory Visit (INDEPENDENT_AMBULATORY_CARE_PROVIDER_SITE_OTHER): Payer: BC Managed Care – PPO | Admitting: Pulmonary Disease

## 2011-09-29 ENCOUNTER — Encounter: Payer: Self-pay | Admitting: Pulmonary Disease

## 2011-09-29 VITALS — BP 142/66 | HR 96 | Temp 98.4°F | Ht 59.0 in | Wt 146.8 lb

## 2011-09-29 DIAGNOSIS — J449 Chronic obstructive pulmonary disease, unspecified: Secondary | ICD-10-CM

## 2011-09-29 DIAGNOSIS — J8410 Pulmonary fibrosis, unspecified: Secondary | ICD-10-CM

## 2011-09-29 DIAGNOSIS — J9610 Chronic respiratory failure, unspecified whether with hypoxia or hypercapnia: Secondary | ICD-10-CM

## 2011-09-29 DIAGNOSIS — J9611 Chronic respiratory failure with hypoxia: Secondary | ICD-10-CM

## 2011-09-29 NOTE — Assessment & Plan Note (Signed)
This is been a very stable interval for Kathryn Ruiz. She continues to exercise regularly with pulmonary rehabilitation. She states it is going slowly but I encouraged her at this is one of the best thing she can do for her health.  Plan: -Continue Spiriva -Continue pulmonary rehabilitation -Get a flu shot this year since available -Continue using 3 L of oxygen with exertion

## 2011-09-29 NOTE — Assessment & Plan Note (Signed)
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 Apparently her gastroenterologist agrees and sent her for a swallowing evaluation over at [facility]. She states that she is taking how second the morning and Pepcid in the afternoon and this is greatly improved her reflux symptoms.  Plan: -Continue the PPI and H2 blocker as prescribed by her gastroenterologist -Obtain the records of the barium swallow performed at [facility] recently

## 2011-09-29 NOTE — Progress Notes (Signed)
Subjective:    Patient ID: Kathryn Ruiz, female    DOB: [birth year], 71 y.o.   MRN: [id number]  Synopsis: Kathryn Ruiz was first followed by Dr. Delton Coombes in the winter of 2013 the [facility] office and then came to the [facility] office in June 2013 for evaluation of COPD and pulmonary fibrosis. She smoked 3 packs a day for 50 years and quit in 2001. She has a history of esophageal dysphagia requiring balloon dilation on multiple times in the past.  HPI  07/09/2011 F/U Kathryn Ruiz -- Kathryn Ruiz is a 71 y/o female with a strong smoking history who saw Dr. Delton Coombes a few weeks ago for shortness of breath.  She states that she was referred by [facility] to Dr. Delton Coombes for ten or more years of dyspnea with associated cough.  She states that when she was a smoker (3ppd for 50 years) she actually felt a little better because she could cough out her phlegm.  Now she can't so she feels congested a lot.  She states that she notes severe acid reflux symptoms and often feels that chokes on food despite having an esophageal balloon dilation several months ago.  She says that the Spiriva is quite helpful, despite it's high cost.    08/06/11 ROV --Kathryn Ruiz states that she has been using her oxygen and it makes her feel little bit better. However she does not like carrying the portable machine around so she's not always using it when she exerts her self. She still has good relief from the Spiriva. Cough is minimal. She has not scheduled an appointment with gastroenterology because she didn't have to pay the co-pay.  09/28/2011 routine office visit-- This has been a stable interval for Kathryn Ruiz. She has been participating in pulmonary rehabilitation regularly at [facility]. She states it is going slow and she is still at level I which is somewhat frustrating for her. However she does seem to have a positive attitude and states she's to continue going. She is going to take advantage of their dietitian to try to find ways to lose  about 20 pounds. Her cough has been stable and her shortness of breath has not changed since her last visit. She recently saw her gastroenterologist to added Pepcid to a daily PPI therapy. She states that this is made quite a difference in her gastroesophageal reflux disease.  Past Medical History  Diagnosis Date  . Fibromyalgia   . GERD (gastroesophageal reflux disease)   . COPD (chronic obstructive pulmonary disease)   . CHF (congestive heart failure)      Review of Systems  Constitutional: Negative for fever, chills and unexpected weight change.  HENT: Negative for nosebleeds, congestion, rhinorrhea, sneezing and postnasal drip.   Respiratory: Positive for shortness of breath. Negative for cough and choking.   Cardiovascular: Negative for chest pain and leg swelling.       Objective:   Physical Exam   Filed Vitals:   09/29/11 1658  BP: 142/66  Pulse: 96  Temp: 98.4 F (36.9 C)  TempSrc: Oral  Height: 4\' 11"  (1.499 m)  Weight: 146 lb 12.8 oz (66.588 kg)  SpO2: 90%   Gen: chronically ill appearing, no acute distress HEENT: NCAT, PERRL, EOMi, OP clear, neck supple without masses PULM: CTA B, few crackles R base CV: RRR, systolic murmur noted, no JVD AB: BS+, soft, nontender, no hsm Ext: warm, trace edema, no clubbing, no cyanosis  05/2011 PFT [facility] >> Ratio 61%, FEV1 1.43 L  91% pred TLC 4.05 L 106% pred DLCO 41% pred 05/2011 CXR >> emphysema with lower lobe fibrosis 06/2011 CT [facility] >> massive emphysema, some fibrosis in lower lobes bilaterally    Assessment & Plan:   Chronic hypoxemic respiratory failure We discussed the importance of oxygen use today. She's had problems with the size and weight of her portable oxygen tank.  Plan: -Order the [employer] portable oxygen device -Continue 3 L of oxygen regularly   COPD (chronic obstructive pulmonary disease) This is been a very stable interval for Kathryn Ruiz. She continues to exercise regularly with pulmonary rehabilitation. She  states it is going slowly but I encouraged her at this is one of the best thing she can do for her health.  Plan: -Continue Spiriva -Continue pulmonary rehabilitation -Get a flu shot this year since available -Continue using 3 L of oxygen with exertion  Pulmonary fibrosis She has mild fibrosis in the bases of her lungs which I think is likely related to chronic aspiration. Apparently her gastroenterologist agrees and sent her for a swallowing evaluation over at [facility]. She states that she is taking how second the morning and Pepcid in the afternoon and this is greatly improved her reflux symptoms.  Plan: -Continue the PPI and H2 blocker as prescribed by her gastroenterologist -Obtain the records of the barium swallow performed at [facility] recently    Updated Medication List Outpatient Encounter Prescriptions as of 09/29/2011  Medication Sig Dispense Refill  . albuterol (VENTOLIN HFA) 108 (90 BASE) MCG/ACT inhaler Inhale 2 puffs into the lungs 4 (four) times daily.      Marland Kitchen amitriptyline (ELAVIL) 10 MG tablet Take 10 mg by mouth at bedtime.      Marland Kitchen FLUoxetine (PROZAC) 20 MG tablet Take 20 mg by mouth daily.      Marland Kitchen omeprazole (PRILOSEC) 20 MG capsule Take 20 mg by mouth daily.      Marland Kitchen tiotropium (SPIRIVA) 18 MCG inhalation capsule Place 1 capsule (18 mcg total) into inhaler and inhale daily.  30 capsule  6  . traMADol (ULTRAM) 50 MG tablet Take 50 mg by mouth every 6 (six) hours as needed.

## 2011-09-29 NOTE — Patient Instructions (Signed)
We have ordered the small tank through [employer] for you. Use your oxygen whenever you go out. Keep using your medications as written. Continue exercising at pulmonary rehab. Follow the dietary instructions you were given at [facility] regarding swallowing difficulty (ie. Take a sip of water before eating). Get a flu shot ASAP We will see you back 3 months or sooner if needed

## 2011-09-29 NOTE — Assessment & Plan Note (Signed)
We discussed the importance of oxygen use today. She's had problems with the size and weight of her portable oxygen tank.  Plan: -Order the [employer] portable oxygen device -Continue 3 L of oxygen regularly

## 2011-10-26 ENCOUNTER — Ambulatory Visit: Payer: Self-pay | Admitting: Family Medicine

## 2011-10-26 ENCOUNTER — Encounter: Payer: Self-pay | Admitting: Pulmonary Disease

## 2011-11-26 ENCOUNTER — Encounter: Payer: Self-pay | Admitting: Pulmonary Disease

## 2011-12-26 ENCOUNTER — Encounter: Payer: Self-pay | Admitting: Pulmonary Disease

## 2012-01-10 ENCOUNTER — Ambulatory Visit: Payer: Self-pay | Admitting: Family Medicine

## 2012-01-26 ENCOUNTER — Encounter: Payer: Self-pay | Admitting: Pulmonary Disease

## 2012-02-07 ENCOUNTER — Ambulatory Visit (INDEPENDENT_AMBULATORY_CARE_PROVIDER_SITE_OTHER): Payer: BC Managed Care – PPO | Admitting: Pulmonary Disease

## 2012-02-07 ENCOUNTER — Encounter: Payer: Self-pay | Admitting: Pulmonary Disease

## 2012-02-07 VITALS — BP 144/74 | HR 87 | Temp 98.2°F | Ht 59.0 in | Wt 148.0 lb

## 2012-02-07 DIAGNOSIS — J449 Chronic obstructive pulmonary disease, unspecified: Secondary | ICD-10-CM

## 2012-02-07 DIAGNOSIS — J9611 Chronic respiratory failure with hypoxia: Secondary | ICD-10-CM

## 2012-02-07 DIAGNOSIS — R05 Cough: Secondary | ICD-10-CM | POA: Insufficient documentation

## 2012-02-07 DIAGNOSIS — J9610 Chronic respiratory failure, unspecified whether with hypoxia or hypercapnia: Secondary | ICD-10-CM

## 2012-02-07 MED ORDER — MOMETASONE FUROATE 50 MCG/ACT NA SUSP: 2.0000 | Freq: Every day | NASAL | Status: DC

## 2012-02-07 NOTE — Progress Notes (Signed)
Subjective:    Patient ID: Kathryn Ruiz, female    DOB: Jul 04, 1940, 72 y.o.   MRN: [id number]  Synopsis: Kathryn Ruiz was first followed by Dr. Delton Coombes in the winter of 2013 the [facility] office and then came to the [facility] office in June 2013 for evaluation of COPD and pulmonary fibrosis. She smoked 3 packs a day for 50 years and quit in 2001. She has a history of esophageal dysphagia requiring balloon dilation on multiple times in the past.  HPI  07/09/2011 F/U Kathryn Ruiz -- Kathryn Ruiz is a 72 y/o female with a strong smoking history who saw Dr. Delton Coombes a few weeks ago for shortness of breath.  She states that she was referred by [facility] to Dr. Delton Coombes for ten or more years of dyspnea with associated cough.  She states that when she was a smoker (3ppd for 50 years) she actually felt a little better because she could cough out her phlegm.  Now she can't so she feels congested a lot.  She states that she notes severe acid reflux symptoms and often feels that chokes on food despite having an esophageal balloon dilation several months ago.  She says that the Spiriva is quite helpful, despite it's high cost.    08/06/11 ROV --Kathryn Ruiz states that she has been using her oxygen and it makes her feel little bit better. However she does not like carrying the portable machine around so she's not always using it when she exerts her self. She still has good relief from the Spiriva. Cough is minimal. She has not scheduled an appointment with gastroenterology because she didn't have to pay the co-pay.  09/28/2011 routine office visit-- This has been a stable interval for Kathryn Ruiz. She has been participating in pulmonary rehabilitation regularly at [facility]. She states it is going slow and she is still at level I which is somewhat frustrating for her. However she does seem to have a positive attitude and states she's to continue going. She is going to take advantage of their dietitian to try to find ways to lose  about 20 pounds. Her cough has been stable and her shortness of breath has not changed since her last visit. She recently saw her gastroenterologist to added Pepcid to a daily PPI therapy. She states that this is made quite a difference in her gastroesophageal reflux disease.  02/07/12 ROV -- Kathryn Ruiz states that she has been doing well aside from a recent sinus infection and cough.  Prior to the sinus infection she had been exercising regularly with [facility] and stated that it made her feel "much better". Sounds like her exercise intolerance improved. She stopped going to [facility] because her insurance has changed. She is nervous that she still is coverage for her medications and her oxygen. She continues to use 3 L of oxygen with exertion and 4 L at night.  In the last few weeks she's had a sinus infection that was initially treated with a Z-Pak and then eventually amoxicillin. This has improved somewhat but she still has yellow drainage. This is associated with a significant cough. She's not using saline rinses or other over-the-counter decongestants. She has not had a fever or chills and her shortness of breath is at baseline. She continues to use her inhalers regularly. She's not having increasing shortness of breath. She has had increased sputum production, particularly worse in the mornings.   Past Medical History  Diagnosis Date  . Fibromyalgia   .  GERD (gastroesophageal reflux disease)   . COPD (chronic obstructive pulmonary disease)   . CHF (congestive heart failure)      Review of Systems  Constitutional: Negative for fever, chills and unexpected weight change.  HENT: Positive for congestion and postnasal drip. Negative for nosebleeds, rhinorrhea and sneezing.   Respiratory: Positive for shortness of breath. Negative for cough and choking.   Cardiovascular: Negative for chest pain and leg swelling.       Objective:   Physical Exam   Filed Vitals:   02/07/12 1156  BP: 144/74    Pulse: 87  Temp: 98.2 F (36.8 C)  TempSrc: Oral  Height: 4\' 11"  (1.499 m)  Weight: 148 lb (67.132 kg)  SpO2: 93%   Gen: chronically ill appearing, no acute distress HEENT: NCAT, PERRL, EOMi, OP clear, neck supple without masses PULM:  few crackles bases bilaterally, otherwise clear CV: RRR, systolic murmur noted, no JVD AB: BS+, soft, nontender, no hsm Ext: warm, trace edema, no clubbing, no cyanosis  05/2011 PFT [facility] >> Ratio 61%, FEV1 1.43 L 91% pred TLC 4.05 L 106% pred DLCO 41% pred 05/2011 CXR >> emphysema with lower lobe fibrosis 06/2011 CT [facility] >> massive emphysema, some fibrosis in lower lobes bilaterally    Assessment & Plan:   Chronic hypoxemic respiratory failure Kathryn Ruiz continues to use and benefit from 3 L of O2 with exertion and 4L O2 at night.  COPD (chronic obstructive pulmonary disease) This has been a stable interval for Kathryn Ruiz aside from the cough she has been experiencing lately.  See below.  She has benefited greatly from pulmonary rehab and I would like for her to get back in that.  I don't want her to get stuck in a rut of not exercising.  Plan: -continue spiriva and advair -continue O2 -continue pulm rehab  Cough I think this is mostly due to her recent sinus infection.  She continues to have drainage despite antibiotics.  Plan: -start saline rinses -start chlortrimeton and decongestants -f/u with ENT (Dr. Jenne Campus) 1/14; if OK by him I want her to start nasonex    Updated Medication List Outpatient Encounter Prescriptions as of 02/07/2012  Medication Sig Dispense Refill  . albuterol (VENTOLIN HFA) 108 (90 BASE) MCG/ACT inhaler Inhale 2 puffs into the lungs 4 (four) times daily.      Marland Kitchen amitriptyline (ELAVIL) 10 MG tablet Take 20 mg by mouth at bedtime.       . famotidine (PEPCID) 20 MG tablet Take 20 mg by mouth at bedtime.      Marland Kitchen FLUoxetine (PROZAC) 20 MG tablet Take 20 mg by mouth daily.      . Fluticasone-Salmeterol (ADVAIR DISKUS IN) Inhale  1 puff into the lungs 2 (two) times daily. Unsure of strength      . omeprazole (PRILOSEC) 20 MG capsule Take 20 mg by mouth daily.      Marland Kitchen tiotropium (SPIRIVA) 18 MCG inhalation capsule Place 1 capsule (18 mcg total) into inhaler and inhale daily.  30 capsule  6  . traMADol (ULTRAM) 50 MG tablet Take 50 mg by mouth every 6 (six) hours as needed.

## 2012-02-07 NOTE — Assessment & Plan Note (Signed)
I think this is mostly due to her recent sinus infection.  She continues to have drainage despite antibiotics.  Plan: -start saline rinses -start chlortrimeton and decongestants -f/u with ENT (Dr. Jenne Campus) 1/14; if OK by him I want her to start nasonex

## 2012-02-07 NOTE — Assessment & Plan Note (Signed)
Kathryn Ruiz continues to use and benefit from 3 L of O2 with exertion and 4L O2 at night.

## 2012-02-07 NOTE — Patient Instructions (Signed)
Use [facility] Med rinses with distilled water at least twice per day using the instructions on the package. 1/2 hour after using the [facility] Med rinse, use Nasonex two puffs in each nostril once per day. Use chlortrimeton and an over the counter decongestant (pseudophed or phenylephrine) as needed for the cough.  Keep using your inhalers as you are doing  Keep using your oxygen at 3 L with exertion and 4L at night  We will see you back in 3 months or sooner if needed

## 2012-02-07 NOTE — Assessment & Plan Note (Signed)
This has been a stable interval for Kathryn Ruiz aside from the cough she has been experiencing lately.  See below.  She has benefited greatly from pulmonary rehab and I would like for her to get back in that.  I don't want her to get stuck in a rut of not exercising.  Plan: -continue spiriva and advair -continue O2 -continue pulm rehab

## 2012-02-08 ENCOUNTER — Encounter: Payer: Self-pay | Admitting: Pulmonary Disease

## 2012-05-02 ENCOUNTER — Ambulatory Visit: Payer: Self-pay | Admitting: Family Medicine

## 2012-05-02 DIAGNOSIS — R05 Cough: Secondary | ICD-10-CM | POA: Diagnosis not present

## 2012-05-02 DIAGNOSIS — IMO0001 Reserved for inherently not codable concepts without codable children: Secondary | ICD-10-CM | POA: Diagnosis not present

## 2012-05-02 DIAGNOSIS — R918 Other nonspecific abnormal finding of lung field: Secondary | ICD-10-CM | POA: Diagnosis not present

## 2012-05-02 DIAGNOSIS — I10 Essential (primary) hypertension: Secondary | ICD-10-CM | POA: Diagnosis not present

## 2012-05-02 DIAGNOSIS — J449 Chronic obstructive pulmonary disease, unspecified: Secondary | ICD-10-CM | POA: Diagnosis not present

## 2012-05-17 DIAGNOSIS — J449 Chronic obstructive pulmonary disease, unspecified: Secondary | ICD-10-CM | POA: Diagnosis not present

## 2012-05-17 DIAGNOSIS — IMO0001 Reserved for inherently not codable concepts without codable children: Secondary | ICD-10-CM | POA: Diagnosis not present

## 2012-05-17 DIAGNOSIS — I509 Heart failure, unspecified: Secondary | ICD-10-CM | POA: Diagnosis not present

## 2012-05-17 DIAGNOSIS — K219 Gastro-esophageal reflux disease without esophagitis: Secondary | ICD-10-CM | POA: Diagnosis not present

## 2012-05-17 DIAGNOSIS — I10 Essential (primary) hypertension: Secondary | ICD-10-CM | POA: Diagnosis not present

## 2012-05-23 ENCOUNTER — Ambulatory Visit: Payer: BC Managed Care – PPO | Admitting: Pulmonary Disease

## 2012-05-30 ENCOUNTER — Ambulatory Visit: Payer: BC Managed Care – PPO | Admitting: Pulmonary Disease

## 2012-05-30 ENCOUNTER — Ambulatory Visit (INDEPENDENT_AMBULATORY_CARE_PROVIDER_SITE_OTHER): Payer: Medicare Other | Admitting: Pulmonary Disease

## 2012-05-30 ENCOUNTER — Encounter: Payer: Self-pay | Admitting: Pulmonary Disease

## 2012-05-30 VITALS — BP 130/62 | HR 85 | Temp 98.5°F | Ht 59.0 in | Wt 148.0 lb

## 2012-05-30 DIAGNOSIS — R05 Cough: Secondary | ICD-10-CM

## 2012-05-30 DIAGNOSIS — J449 Chronic obstructive pulmonary disease, unspecified: Secondary | ICD-10-CM | POA: Diagnosis not present

## 2012-05-30 DIAGNOSIS — J40 Bronchitis, not specified as acute or chronic: Secondary | ICD-10-CM

## 2012-05-30 DIAGNOSIS — J9610 Chronic respiratory failure, unspecified whether with hypoxia or hypercapnia: Secondary | ICD-10-CM

## 2012-05-30 DIAGNOSIS — J309 Allergic rhinitis, unspecified: Secondary | ICD-10-CM | POA: Diagnosis not present

## 2012-05-30 DIAGNOSIS — J9611 Chronic respiratory failure with hypoxia: Secondary | ICD-10-CM

## 2012-05-30 NOTE — Patient Instructions (Signed)
Use mucinex or its generic form (guaifenesin) at least two times per day to help loosen up the mucus Use the flutter valve at least three times per day (5-7 puffs each time) to help loosen up the mucus  Hopefully with the mucinex and the flutter valve you can help control your coughing spells to happen at home  Bring [place] two more samples of your mucus so we can test it  We will call you with the results of your tests  We will see you back in 2 months or sooner if needed

## 2012-05-30 NOTE — Assessment & Plan Note (Signed)
See discussion above 

## 2012-05-30 NOTE — Progress Notes (Signed)
Subjective:    Patient ID: Kathryn Ruiz, female    DOB: 1940-02-12, 72 y.o.   MRN: [id number]  Synopsis: Kathryn Ruiz was first followed by Dr. Delton Coombes in the winter of 2013 the [facility] office and then came to the [facility] office in June 2013 for evaluation of COPD and pulmonary fibrosis. She smoked 3 packs a day for 50 years and quit in 2001. She has a history of esophageal dysphagia requiring balloon dilation on multiple times in the past.  HPI  07/09/2011 F/U McQuaid -- Kathryn Ruiz is a 72 y/o female with a strong smoking history who saw Dr. Delton Coombes a few weeks ago for shortness of breath.  She states that she was referred by [facility] to Dr. Delton Coombes for ten or more years of dyspnea with associated cough.  She states that when she was a smoker (3ppd for 50 years) she actually felt a little better because she could cough out her phlegm.  Now she can't so she feels congested a lot.  She states that she notes severe acid reflux symptoms and often feels that chokes on food despite having an esophageal balloon dilation several months ago.  She says that the Spiriva is quite helpful, despite it's high cost.    08/06/11 ROV --Kathryn Ruiz states that she has been using her oxygen and it makes her feel little bit better. However she does not like carrying the portable machine around so she's not always using it when she exerts her self. She still has good relief from the Spiriva. Cough is minimal. She has not scheduled an appointment with gastroenterology because she didn't have to pay the co-pay.  09/28/2011 routine office visit-- This has been a stable interval for Kathryn Ruiz. She has been participating in pulmonary rehabilitation regularly at [facility]. She states it is going slow and she is still at level I which is somewhat frustrating for her. However she does seem to have a positive attitude and states she's to continue going. She is going to take advantage of their dietitian to try to find ways to lose  about 20 pounds. Her cough has been stable and her shortness of breath has not changed since her last visit. She recently saw her gastroenterologist to added Pepcid to a daily PPI therapy. She states that this is made quite a difference in her gastroesophageal reflux disease.  02/07/12 ROV -- Kathryn Ruiz states that she has been doing well aside from a recent sinus infection and cough.  Prior to the sinus infection she had been exercising regularly with [facility] and stated that it made her feel "much better". Sounds like her exercise intolerance improved. She stopped going to lung works because her insurance has changed. She is nervous that she still is coverage for her medications and her oxygen. She continues to use 3 L of oxygen with exertion and 4 L at night.  In the last few weeks she's had a sinus infection that was initially treated with a Z-Pak and then eventually amoxicillin. This has improved somewhat but she still has yellow drainage. This is associated with a significant cough. She's not using saline rinses or other over-the-counter decongestants. She has not had a fever or chills and her shortness of breath is at baseline. She continues to use her inhalers regularly. She's not having increasing shortness of breath. She has had increased sputum production, particularly worse in the mornings.  05/30/2012 ROV --Kathryn Ruiz says that for the past several months he  has been having increasing cough and mucus production. She says that this is definitely worse than it was a year ago. Her shortness of breath is at baseline and has not worsened. She continues to use 3 L of oxygen with exertion, Advair, and Spiriva. She feels that she benefits from all of these. She continues to participate in pulmonary rehabilitation. She has had episodes of hoarseness associated with worsened mucus production. She states that Dr. Jenne Campus could not see a clear vocal cord abnormality that was contributing to the hoarseness. She does  have significant postnasal drip and sinus drainage but she feels that some of the mucus she is producing is coming from her lungs. She has not had fevers chills or weight loss. One month ago she was treated with an antibiotic and Tessalon Perles for increasing cough and congestion. She says that both of these helped.   Past Medical History  Diagnosis Date  . Fibromyalgia   . GERD (gastroesophageal reflux disease)   . COPD (chronic obstructive pulmonary disease)   . CHF (congestive heart failure)      Review of Systems  Constitutional: Negative for fever, chills and unexpected weight change.  HENT: Positive for congestion and postnasal drip. Negative for nosebleeds, rhinorrhea and sneezing.   Respiratory: Positive for shortness of breath. Negative for cough and choking.   Cardiovascular: Negative for chest pain and leg swelling.       Objective:   Physical Exam   Filed Vitals:   05/30/12 1002  BP: 130/62  Pulse: 85  Temp: 98.5 F (36.9 C)  TempSrc: Oral  Height: 4\' 11"  (1.499 m)  Weight: 148 lb (67.132 kg)  SpO2: 91%   Gen: chronically ill appearing, no acute distress HEENT: NCAT, OP clear PULM:  few crackles bases bilaterally, otherwise clear CV: RRR, systolic murmur noted, no JVD AB: BS+, soft, nontender, no hsm Ext: warm, trace edema, no clubbing, no cyanosis  05/2011 PFT [facility] >> Ratio 61%, FEV1 1.43 L 91% pred TLC 4.05 L 106% pred DLCO 41% pred 05/2011 CXR >> emphysema with lower lobe fibrosis 06/2011 CT [facility] >> massive emphysema, some fibrosis in lower lobes bilaterally    Assessment & Plan:   COPD (chronic obstructive pulmonary disease) Kathryn Ruiz's dyspnea is at baseline, however I am concerned about her cough and sputum production. It seems that the mucus is coming both from her nose and her lungs. It is likely contributing to her hoarseness.  I suspect that the sputum production is from worsening chronic bronchitis, but we should test her for ABPA as well as  mycobacterial disease.  Plan: -Continue Spiriva and Advair -Obtain sputum for AFB, fungal -Obtain total IgE level -Start Mucinex regularly -Use flutter valve at home to try to control her coughing and mucus production in an attempt to minimize coughing episodes out in public -Continue Tessalon Perles -If not improved on next visit then consider switching Advair to formoterol and Pulmicort nebulized -Followup 2 months  Cough See discussion above.  Allergic rhinitis This is likely contributing some to her cough and mucus production.  Plan: -I advised her to start using Flonase regularly (she has at home)  Chronic hypoxemic respiratory failure Continue 3 L of oxygen with exertion    Updated Medication List Outpatient Encounter Prescriptions as of 05/30/2012  Medication Sig Dispense Refill  . albuterol (VENTOLIN HFA) 108 (90 BASE) MCG/ACT inhaler Inhale 2 puffs into the lungs 4 (four) times daily.      Marland Kitchen amitriptyline (ELAVIL) 10 MG tablet Take  20 mg by mouth at bedtime.       . famotidine (PEPCID) 20 MG tablet Take 20 mg by mouth at bedtime.      Marland Kitchen FLUoxetine (PROZAC) 20 MG tablet Take 20 mg by mouth daily.      . Fluticasone-Salmeterol (ADVAIR DISKUS IN) Inhale 1 puff into the lungs 2 (two) times daily. Unsure of strength      . mometasone (NASONEX) 50 MCG/ACT nasal spray Place 2 sprays into the nose daily.  17 g  2  . omeprazole (PRILOSEC) 20 MG capsule Take 20 mg by mouth daily.      Marland Kitchen tiotropium (SPIRIVA) 18 MCG inhalation capsule Place 1 capsule (18 mcg total) into inhaler and inhale daily.  30 capsule  6  . traMADol (ULTRAM) 50 MG tablet Take 50 mg by mouth every 6 (six) hours as needed.       No facility-administered encounter medications on file as of 05/30/2012.

## 2012-05-30 NOTE — Assessment & Plan Note (Signed)
Continue 3 L of oxygen with exertion

## 2012-05-30 NOTE — Assessment & Plan Note (Signed)
Kathryn Ruiz's dyspnea is at baseline, however I am concerned about her cough and sputum production. It seems that the mucus is coming both from her nose and her lungs. It is likely contributing to her hoarseness.  I suspect that the sputum production is from worsening chronic bronchitis, but we should test her for ABPA as well as mycobacterial disease.  Plan: -Continue Spiriva and Advair -Obtain sputum for AFB, fungal -Obtain total IgE level -Start Mucinex regularly -Use flutter valve at home to try to control her coughing and mucus production in an attempt to minimize coughing episodes out in public -Continue Tessalon Perles -If not improved on next visit then consider switching Advair to formoterol and Pulmicort nebulized -Followup 2 months

## 2012-05-30 NOTE — Assessment & Plan Note (Signed)
This is likely contributing some to her cough and mucus production.  Plan: -I advised her to start using Flonase regularly (she has at home)

## 2012-06-01 ENCOUNTER — Other Ambulatory Visit: Payer: Medicare Other

## 2012-06-01 DIAGNOSIS — J40 Bronchitis, not specified as acute or chronic: Secondary | ICD-10-CM | POA: Diagnosis not present

## 2012-06-02 ENCOUNTER — Encounter: Payer: Self-pay | Admitting: Pulmonary Disease

## 2012-06-02 NOTE — Progress Notes (Signed)
Quick Note:  Spoke with pt and notified of results per Dr. McQuaid. Pt verbalized understanding and denied any questions.  ______ 

## 2012-06-06 ENCOUNTER — Encounter: Payer: Self-pay | Admitting: Pulmonary Disease

## 2012-06-20 DIAGNOSIS — R05 Cough: Secondary | ICD-10-CM | POA: Diagnosis not present

## 2012-06-20 DIAGNOSIS — J449 Chronic obstructive pulmonary disease, unspecified: Secondary | ICD-10-CM | POA: Diagnosis not present

## 2012-06-20 DIAGNOSIS — K219 Gastro-esophageal reflux disease without esophagitis: Secondary | ICD-10-CM | POA: Diagnosis not present

## 2012-06-25 LAB — FUNGUS CULTURE W SMEAR

## 2012-07-18 LAB — AFB CULTURE WITH SMEAR (NOT AT ARMC)

## 2012-07-19 LAB — AFB CULTURE WITH SMEAR (NOT AT ARMC): Acid Fast Smear: NONE SEEN

## 2012-07-19 NOTE — Progress Notes (Signed)
Quick Note:  Spoke with pt and notified of results per Dr. Wert. Pt verbalized understanding and denied any questions.  ______ 

## 2012-07-21 LAB — AFB CULTURE WITH SMEAR (NOT AT ARMC)

## 2012-08-01 ENCOUNTER — Ambulatory Visit (INDEPENDENT_AMBULATORY_CARE_PROVIDER_SITE_OTHER): Payer: Medicare Other | Admitting: Pulmonary Disease

## 2012-08-01 ENCOUNTER — Other Ambulatory Visit: Payer: Self-pay | Admitting: *Deleted

## 2012-08-01 ENCOUNTER — Encounter: Payer: Self-pay | Admitting: Pulmonary Disease

## 2012-08-01 VITALS — BP 120/70 | HR 110 | Ht 59.0 in | Wt 148.0 lb

## 2012-08-01 DIAGNOSIS — J8410 Pulmonary fibrosis, unspecified: Secondary | ICD-10-CM

## 2012-08-01 DIAGNOSIS — J449 Chronic obstructive pulmonary disease, unspecified: Secondary | ICD-10-CM

## 2012-08-01 DIAGNOSIS — J4489 Other specified chronic obstructive pulmonary disease: Secondary | ICD-10-CM

## 2012-08-01 DIAGNOSIS — J309 Allergic rhinitis, unspecified: Secondary | ICD-10-CM

## 2012-08-01 MED ORDER — FORMOTEROL FUMARATE 20 MCG/2ML IN NEBU: 20.0000 ug | INHALATION_SOLUTION | Freq: Two times a day (BID) | RESPIRATORY_TRACT | Status: DC

## 2012-08-01 MED ORDER — BUDESONIDE 0.5 MG/2ML IN SUSP: 0.5000 mg | Freq: Two times a day (BID) | RESPIRATORY_TRACT | Status: DC

## 2012-08-01 MED ORDER — BUDESONIDE 0.25 MG/2ML IN SUSP: 0.2500 mg | Freq: Two times a day (BID) | RESPIRATORY_TRACT | Status: DC

## 2012-08-01 NOTE — Assessment & Plan Note (Signed)
She has had mild interstitial changes seen in the bases, however I think that predominantly what is going on in her lungs is severe emphysema. Because of her worsening symptoms and negative workup recently, I will repeat a CT scan to see if there is any evidence of worsening disease. I have asked that it be performed in [place] so that our thoracic radiologists can review the images.

## 2012-08-01 NOTE — Patient Instructions (Addendum)
We will start pulmicort and perforomist to use with a nebulizer twice a day  We will order a CT scan for you; it is OK to do it on a Thursday or Friday  Use mucinex at least twice a day  We will prescribe a portable oxygen concentrator for you  We will see you back in 6-8 weeks or sooner if needed

## 2012-08-01 NOTE — Progress Notes (Signed)
Subjective:    Patient ID: Kathryn Ruiz, female    DOB: [birth year], 72 y.o.   MRN: [id number]  Synopsis: Kathryn Ruiz was first followed by Dr. Delton Coombes in the winter of 2013 the [facility] office and then came to the [facility] office in June 2013 for evaluation of COPD and pulmonary fibrosis. She smoked 3 packs a day for 50 years and quit in 2001. She has a history of esophageal dysphagia requiring balloon dilation on multiple times in the past.  HPI  07/09/2011 F/U McQuaid -- Kathryn Ruiz is a 72 y/o female with a strong smoking history who saw Dr. Delton Coombes a few weeks ago for shortness of breath.  She states that she was referred by [facility] to Dr. Delton Coombes for ten or more years of dyspnea with associated cough.  She states that when she was a smoker (3ppd for 50 years) she actually felt a little better because she could cough out her phlegm.  Now she can't so she feels congested a lot.  She states that she notes severe acid reflux symptoms and often feels that chokes on food despite having an esophageal balloon dilation several months ago.  She says that the Spiriva is quite helpful, despite it's high cost.    08/06/11 ROV --Kathryn Ruiz states that she has been using her oxygen and it makes her feel little bit better. However she does not like carrying the portable machine around so she's not always using it when she exerts her self. She still has good relief from the Spiriva. Cough is minimal. She has not scheduled an appointment with gastroenterology because she didn't have to pay the co-pay.  09/28/2011 routine office visit-- This has been a stable interval for Kathryn Ruiz. She has been participating in pulmonary rehabilitation regularly at [facility]. She states it is going slow and she is still at level I which is somewhat frustrating for her. However she does seem to have a positive attitude and states she's to continue going. She is going to take advantage of their dietitian to try to find ways to lose  about 20 pounds. Her cough has been stable and her shortness of breath has not changed since her last visit. She recently saw her gastroenterologist to added Pepcid to a daily PPI therapy. She states that this is made quite a difference in her gastroesophageal reflux disease.  02/07/12 ROV -- Kathryn Ruiz states that she has been doing well aside from a recent sinus infection and cough.  Prior to the sinus infection she had been exercising regularly with lung works and stated that it made her feel "much better". Sounds like her exercise intolerance improved. She stopped going to lung works because her insurance has changed. She is nervous that she still is coverage for her medications and her oxygen. She continues to use 3 L of oxygen with exertion and 4 L at night.  In the last few weeks she's had a sinus infection that was initially treated with a Z-Pak and then eventually amoxicillin. This has improved somewhat but she still has yellow drainage. This is associated with a significant cough. She's not using saline rinses or other over-the-counter decongestants. She has not had a fever or chills and her shortness of breath is at baseline. She continues to use her inhalers regularly. She's not having increasing shortness of breath. She has had increased sputum production, particularly worse in the mornings.   05/30/2012 ROV --Kathryn Ruiz says that for the past several months  he has been having increasing cough and mucus production. She says that this is definitely worse than it was a year ago. Her shortness of breath is at baseline and has not worsened. She continues to use 3 L of oxygen with exertion, Advair, and Spiriva. She feels that she benefits from all of these. She continues to participate in pulmonary rehabilitation. She has had episodes of hoarseness associated with worsened mucus production. She states that Dr. Jenne Campus could not see a clear vocal cord abnormality that was contributing to the hoarseness. She  does have significant postnasal drip and sinus drainage but she feels that some of the mucus she is producing is coming from her lungs. She has not had fevers chills or weight loss. One month ago she was treated with an antibiotic and Tessalon Perles for increasing cough and congestion. She says that both of these helped.   08/01/2012 ROV >> Kathryn Ruiz still has cough and mucus production in the mornings. She describes copious amounts of mucus coming up in the morning which is yellow and green in color. She says that she feels like a continues to get worse. Her shortness of breath is at baseline. She has not had fevers or chills. She does note sinus congestion and sinus drainage. Recently her primary care physician stop the Advair because he thought this is making her mucus production worse. She wants to travel and needs a Designer, jewellery.   Past Medical History  Diagnosis Date  . Fibromyalgia   . GERD (gastroesophageal reflux disease)   . COPD (chronic obstructive pulmonary disease)   . CHF (congestive heart failure)      Review of Systems  Constitutional: Negative for fever, chills and unexpected weight change.  HENT: Positive for congestion and postnasal drip. Negative for nosebleeds, rhinorrhea and sneezing.   Respiratory: Positive for shortness of breath. Negative for cough and choking.   Cardiovascular: Negative for chest pain and leg swelling.       Objective:   Physical Exam   Filed Vitals:   08/01/12 1112  BP: 120/70  Pulse: 110  Height: 4\' 11"  (1.499 m)  Weight: 148 lb (67.132 kg)  SpO2: 94%   Gen: chronically ill appearing, no acute distress HEENT: NCAT, OP clear, nasopharynx clear PULM:  few crackles bases bilaterally, otherwise clear CV: RRR, systolic murmur noted, no JVD AB: BS+, soft, nontender, no hsm Ext: warm, trace edema, no clubbing, no cyanosis  05/2011 PFT [facility] >> Ratio 61%, FEV1 1.43 L 91% pred TLC 4.05 L 106% pred DLCO 41% pred 05/2011 CXR >> emphysema  with lower lobe fibrosis 06/2011 CT [facility] >> massive emphysema, some fibrosis in lower lobes bilaterally    Assessment & Plan:   COPD (chronic obstructive pulmonary disease) Kathryn Ruiz chronic bronchitis is really causing her more problems. Our workup for mycobacterial disease, ABPA and fungal disease was negative. Her sputum cultures  were negative.  The most likely explanation for her ongoing daily sputum production his worsening chronic bronchitis. I think that it is likely that allergic rhinitis and postnasal drip is also contributing as she does not take anything for this.  Because of the chronic bronchitis symptoms she really needs to be on an inhaled long-acting beta agonist and inhaled corticosteroid.  Plan: -I have instructed her to restart taking a nasal steroid. -Add Mucinex twice a day -Add Perforomist and Pulmicort twice a day -If no improvement, consider bronchoscopy for culture of secretions.   Pulmonary fibrosis She has had mild interstitial changes seen  in the bases, however I think that predominantly what is going on in her lungs is severe emphysema. Because of her worsening symptoms and negative workup recently, I will repeat a CT scan to see if there is any evidence of worsening disease. I have asked that it be performed in [place] so that our thoracic radiologists can review the images.  Allergic rhinitis Start back Nasonex, instructions on regular use given Generic Zyrtec    Updated Medication List Outpatient Encounter Prescriptions as of 08/01/2012  Medication Sig Dispense Refill  . albuterol (VENTOLIN HFA) 108 (90 BASE) MCG/ACT inhaler Inhale 2 puffs into the lungs 4 (four) times daily.      Marland Kitchen amitriptyline (ELAVIL) 10 MG tablet Take 20 mg by mouth at bedtime.       . famotidine (PEPCID) 20 MG tablet Take 20 mg by mouth at bedtime.      Marland Kitchen FLUoxetine (PROZAC) 20 MG tablet Take 20 mg by mouth daily.      . Fluticasone-Salmeterol (ADVAIR DISKUS IN) Inhale 1 puff into  the lungs 2 (two) times daily. Unsure of strength      . mometasone (NASONEX) 50 MCG/ACT nasal spray Place 2 sprays into the nose daily.  17 g  2  . omeprazole (PRILOSEC) 20 MG capsule Take 20 mg by mouth daily.      Marland Kitchen tiotropium (SPIRIVA) 18 MCG inhalation capsule Place 1 capsule (18 mcg total) into inhaler and inhale daily.  30 capsule  6  . traMADol (ULTRAM) 50 MG tablet Take 50 mg by mouth every 6 (six) hours as needed.       No facility-administered encounter medications on file as of 08/01/2012.

## 2012-08-01 NOTE — Assessment & Plan Note (Signed)
Kathryn Ruiz's chronic bronchitis is really causing her more problems. Our workup for mycobacterial disease, ABPA and fungal disease was negative. Her sputum cultures  were negative.  The most likely explanation for her ongoing daily sputum production his worsening chronic bronchitis. I think that it is likely that allergic rhinitis and postnasal drip is also contributing as she does not take anything for this.  Because of the chronic bronchitis symptoms she really needs to be on an inhaled long-acting beta agonist and inhaled corticosteroid.  Plan: -I have instructed her to restart taking a nasal steroid. -Add Mucinex twice a day -Add Perforomist and Pulmicort twice a day -If no improvement, consider bronchoscopy for culture of secretions.

## 2012-08-01 NOTE — Assessment & Plan Note (Signed)
Start back Nasonex, instructions on regular use given Generic Zyrtec

## 2012-08-04 ENCOUNTER — Ambulatory Visit (INDEPENDENT_AMBULATORY_CARE_PROVIDER_SITE_OTHER)
Admission: RE | Admit: 2012-08-04 | Discharge: 2012-08-04 | Disposition: A | Discharge status: Home / Self Care | Payer: Medicare Other | Source: Ambulatory Visit | Attending: Pulmonary Disease | Admitting: Pulmonary Disease

## 2012-08-04 DIAGNOSIS — J8410 Pulmonary fibrosis, unspecified: Secondary | ICD-10-CM

## 2012-08-04 DIAGNOSIS — J449 Chronic obstructive pulmonary disease, unspecified: Secondary | ICD-10-CM | POA: Diagnosis not present

## 2012-08-04 DIAGNOSIS — J438 Other emphysema: Secondary | ICD-10-CM | POA: Diagnosis not present

## 2012-08-08 ENCOUNTER — Encounter: Payer: Self-pay | Admitting: Pulmonary Disease

## 2012-08-09 ENCOUNTER — Telehealth: Payer: Self-pay | Admitting: Pulmonary Disease

## 2012-08-09 NOTE — Telephone Encounter (Signed)
Notes Recorded by Lupita Leash, MD on 08/08/2012 at 6:04 PM L, Please let her know that her CT showed a lot of emphysema, but no new fibrosis or scarring which is good. THanks, B ----  I spoke with patient about results and she verbalized understanding and had no questions

## 2012-09-12 ENCOUNTER — Ambulatory Visit: Payer: Medicare Other | Admitting: Pulmonary Disease

## 2012-10-09 ENCOUNTER — Telehealth: Payer: Self-pay | Admitting: Pulmonary Disease

## 2012-10-09 NOTE — Telephone Encounter (Signed)
I spoke with pt. I advised her will forward to BQ as an [place]

## 2012-10-10 ENCOUNTER — Ambulatory Visit (INDEPENDENT_AMBULATORY_CARE_PROVIDER_SITE_OTHER): Payer: Medicare Other | Admitting: Pulmonary Disease

## 2012-10-10 ENCOUNTER — Encounter: Payer: Self-pay | Admitting: Pulmonary Disease

## 2012-10-10 VITALS — BP 106/64 | HR 88 | Temp 98.5°F | Ht 59.0 in | Wt 147.0 lb

## 2012-10-10 DIAGNOSIS — J9610 Chronic respiratory failure, unspecified whether with hypoxia or hypercapnia: Secondary | ICD-10-CM | POA: Diagnosis not present

## 2012-10-10 DIAGNOSIS — J449 Chronic obstructive pulmonary disease, unspecified: Secondary | ICD-10-CM | POA: Diagnosis not present

## 2012-10-10 DIAGNOSIS — J8410 Pulmonary fibrosis, unspecified: Secondary | ICD-10-CM

## 2012-10-10 DIAGNOSIS — J9611 Chronic respiratory failure with hypoxia: Secondary | ICD-10-CM

## 2012-10-10 DIAGNOSIS — R05 Cough: Secondary | ICD-10-CM

## 2012-10-10 DIAGNOSIS — Z23 Encounter for immunization: Secondary | ICD-10-CM | POA: Diagnosis not present

## 2012-10-10 NOTE — Assessment & Plan Note (Signed)
This is due to postnasal drip, acid reflux, and COPD. There is also a component of cyclical cough.  Plan: -Voice rest -Hard candies, warm beverages to soothe the throat -Tramadol to help suppress the cough -Add sinus toilet - I encouraged her to use her Prilosec and Pepcid regularly, lifestyle modification changes for gastroesophageal reflux disease reviewed

## 2012-10-10 NOTE — Assessment & Plan Note (Addendum)
It was not clear from the July 2014 CT chest that she has  pulmonary fibrosis. Mostly what is seen is emphysema with questionable fibrotic changes.  We will repeat pulmonary function testing on the next visit and if there is clear evidence of decline then we will discuss whether or not treating empirically for IPF is worthwhile.

## 2012-10-10 NOTE — Patient Instructions (Signed)
For the cough (due to COPD, sinus congestion, and acid reflux): Use tramadol every 6 hours as needed for cough; Tessalon Perles are OK too >use the nasal spray and over the counter phenylephrine as directed for sinus congestion; use saline sprays as well >use prilosec in the morning and pepcid at night >follow the acid reflux guidelines sheet  Try to pick three days when you don't talk, laugh, or sing; during this time do every thing you can to suppress your cough  Use your oxygen when you walk around  We will see you back in 3 months and will get pulmonary function testing before that visit  We will see you back in 3 months or sooner if needed

## 2012-10-10 NOTE — Assessment & Plan Note (Signed)
-  Continue perforomist and pulmicort and spiriva -Flu shot today -continue oxygen with exertion

## 2012-10-10 NOTE — Progress Notes (Signed)
Subjective:    Patient ID: Kathryn Ruiz, female    DOB: 1940/05/31, 73 y.o.   MRN: [id number]  Synopsis: Kathryn Ruiz was first followed by Dr. Lamonte Sakai in the winter of 2013 the [place] office and then came to the [facility] office in June 2013 for evaluation of COPD and pulmonary fibrosis. She smoked 3 packs a day for 50 years and quit in 2001. She has a history of esophageal dysphagia requiring balloon dilation on multiple times in the past.  HPI  07/09/2011 F/U Kathryn Ruiz -- Kathryn Ruiz is a 72 y/o female with a strong smoking history who saw Dr. Lamonte Sakai a few weeks ago for shortness of breath.  She states that she was referred by [facility] to Dr. Lamonte Sakai for ten or more years of dyspnea with associated cough.  She states that when she was a smoker (3ppd for 50 years) she actually felt a little better because she could cough out her phlegm.  Now she can't so she feels congested a lot.  She states that she notes severe acid reflux symptoms and often feels that chokes on food despite having an esophageal balloon dilation several months ago.  She says that the Spiriva is quite helpful, despite it's high cost.    08/06/11 ROV --Kathryn Ruiz states that she has been using her oxygen and it makes her feel little bit better. However she does not like carrying the portable machine around so she's not always using it when she exerts her self. She still has good relief from the Spiriva. Cough is minimal. She has not scheduled an appointment with gastroenterology because she didn't have to pay the co-pay.  09/28/2011 routine office visit-- This has been a stable interval for Kathryn Ruiz. She has been participating in pulmonary rehabilitation regularly at [facility]. She states it is going slow and she is still at level I which is somewhat frustrating for her. However she does seem to have a positive attitude and states she's to continue going. She is going to take advantage of their dietitian to try to find ways to lose  about 20 pounds. Her cough has been stable and her shortness of breath has not changed since her last visit. She recently saw her gastroenterologist to added Pepcid to a daily PPI therapy. She states that this is made quite a difference in her gastroesophageal reflux disease.  02/07/12 ROV -- Kathryn Ruiz states that she has been doing well aside from a recent sinus infection and cough.  Prior to the sinus infection she had been exercising regularly with [facility] and stated that it made her feel "much better". Sounds like her exercise intolerance improved. She stopped going to lung works because her insurance has changed. She is nervous that she still is coverage for her medications and her oxygen. She continues to use 3 L of oxygen with exertion and 4 L at night.  In the last few weeks she's had a sinus infection that was initially treated with a Z-Pak and then eventually amoxicillin. This has improved somewhat but she still has yellow drainage. This is associated with a significant cough. She's not using saline rinses or other over-the-counter decongestants. She has not had a fever or chills and her shortness of breath is at baseline. She continues to use her inhalers regularly. She's not having increasing shortness of breath. She has had increased sputum production, particularly worse in the mornings.   05/30/2012 ROV --Kathryn Ruiz says that for the past several months  he has been having increasing cough and mucus production. She says that this is definitely worse than it was a year ago. Her shortness of breath is at baseline and has not worsened. She continues to use 3 L of oxygen with exertion, Advair, and Spiriva. She feels that she benefits from all of these. She continues to participate in pulmonary rehabilitation. She has had episodes of hoarseness associated with worsened mucus production. She states that Dr. Jenne Campus could not see a clear vocal cord abnormality that was contributing to the hoarseness. She  does have significant postnasal drip and sinus drainage but she feels that some of the mucus she is producing is coming from her lungs. She has not had fevers chills or weight loss. One month ago she was treated with an antibiotic and Tessalon Perles for increasing cough and congestion. She says that both of these helped.   08/01/2012 ROV >> Kathryn Ruiz still has cough and mucus production in the mornings. She describes copious amounts of mucus coming up in the morning which is yellow and green in color. She says that she feels like a continues to get worse. Her shortness of breath is at baseline. She has not had fevers or chills. She does note sinus congestion and sinus drainage. Recently her primary care physician stop the Advair because he thought this is making her mucus production worse. She wants to travel and needs a Designer, jewellery.   10/10/2012 ROV > Kathryn Ruiz says that her shortness of breath has improved a little bit since using the Pulmicort and performance. She still notes some shortness of breath with activity such as carrying groceries. She is not using her oxygen on exertion very often. She has a lot of cough which is sometimes productive of clear sputum. Tessalon Perles help with this. She wants to travel to [place] soon for a craft show but does not want to bring her oxygen concentrator with her. She has been using a nasal spray but continues to have some postnasal drip and sinus congestion. She does not take an over-the-counter antihistamine or decongestant. She has a lot of acid reflux which is not well-controlled at all. She is no longer taking her Pepcid or Prilosec because she says she cannot afford it. She sleeps in a hospital bed with her head elevated.     Past Medical History  Diagnosis Date  . Fibromyalgia   . GERD (gastroesophageal reflux disease)   . COPD (chronic obstructive pulmonary disease)   . CHF (congestive heart failure)      Review of Systems  Constitutional:  Negative for fever, chills and unexpected weight change.  HENT: Positive for congestion and postnasal drip. Negative for nosebleeds, rhinorrhea and sneezing.   Respiratory: Positive for cough and shortness of breath. Negative for choking.   Cardiovascular: Negative for chest pain and leg swelling.       Objective:   Physical Exam   Filed Vitals:   10/10/12 1036  BP: 106/64  Pulse: 88  Temp: 98.5 F (36.9 C)  TempSrc: Oral  Height: 4\' 11"  (1.499 m)  Weight: 147 lb (66.679 kg)  SpO2: 90%   Gen: chronically ill appearing, no acute distress HEENT: NCAT, OP clear, nasopharynx clear PULM:  few crackles bases bilaterally, some wheezing upper lobes CV: RRR, systolic murmur noted, no JVD AB: BS+, soft, nontender, no hsm Ext: warm, trace edema, no clubbing, no cyanosis  05/2011 PFT [facility] >> Ratio 61%, FEV1 1.43 L 91% pred TLC 4.05 L 106% pred DLCO 41%  pred 05/2011 CXR >> emphysema with lower lobe fibrosis 06/2011 [facility] >> massive emphysema, some fibrosis in lower lobes bilaterally 07/2012 CT chest >> emphsema with questionable fibrotic changes in the bases; no clear honeycombing, mild changes; coronary calcification noted    Assessment & Plan:   Chronic hypoxemic respiratory failure Kathryn Ruiz has not been using her oxygen regularly with exertion as ordered. This is likely the reason why she is getting short of breath when she exerts herself. I encouraged her at great length today to be compliant with her oxygen therapy.  COPD (chronic obstructive pulmonary disease) -Continue perforomist and pulmicort and spiriva -Flu shot today -continue oxygen with exertion  Cough This is due to postnasal drip, acid reflux, and COPD. There is also a component of cyclical cough.  Plan: -Voice rest -Hard candies, warm beverages to soothe the throat -Tramadol to help suppress the cough -Add sinus toilet - I encouraged her to use her Prilosec and Pepcid regularly, lifestyle modification changes for  gastroesophageal reflux disease reviewed  Pulmonary fibrosis It was not clear from the July 2014 CT chest that she has  pulmonary fibrosis. Mostly what is seen is emphysema with questionable fibrotic changes.  We will repeat pulmonary function testing on the next visit and if there is clear evidence of decline then we will discuss whether or not treating empirically for IPF is worthwhile.    Updated Medication List Outpatient Encounter Prescriptions as of 10/10/2012  Medication Sig Dispense Refill  . albuterol (VENTOLIN HFA) 108 (90 BASE) MCG/ACT inhaler Inhale 2 puffs into the lungs 4 (four) times daily.      Marland Kitchen amitriptyline (ELAVIL) 10 MG tablet Take 20 mg by mouth at bedtime.       . benzonatate (TESSALON) 100 MG capsule Take 100 mg by mouth 4 (four) times daily as needed for cough.      . budesonide (PULMICORT) 0.5 MG/2ML nebulizer solution Take 2 mLs (0.5 mg total) by nebulization 2 (two) times daily.  120 mL  12  . famotidine (PEPCID) 20 MG tablet Take 20 mg by mouth at bedtime.      Marland Kitchen FLUoxetine (PROZAC) 20 MG tablet Take 20 mg by mouth daily.      . formoterol (PERFOROMIST) 20 MCG/2ML nebulizer solution Take 2 mLs (20 mcg total) by nebulization 2 (two) times daily.  120 mL  12  . mometasone (NASONEX) 50 MCG/ACT nasal spray Place 2 sprays into the nose daily.  17 g  2  . omeprazole (PRILOSEC) 20 MG capsule Take 20 mg by mouth daily.      Marland Kitchen tiotropium (SPIRIVA) 18 MCG inhalation capsule Place 1 capsule (18 mcg total) into inhaler and inhale daily.  30 capsule  6  . traMADol (ULTRAM) 50 MG tablet Take 50 mg by mouth every 6 (six) hours as needed.       No facility-administered encounter medications on file as of 10/10/2012.

## 2012-10-10 NOTE — Assessment & Plan Note (Signed)
Kathryn Ruiz has not been using her oxygen regularly with exertion as ordered. This is likely the reason why she is getting short of breath when she exerts herself. I encouraged her at great length today to be compliant with her oxygen therapy.

## 2012-10-16 ENCOUNTER — Telehealth: Payer: Self-pay | Admitting: Pulmonary Disease

## 2012-10-16 DIAGNOSIS — J8410 Pulmonary fibrosis, unspecified: Secondary | ICD-10-CM

## 2012-10-16 DIAGNOSIS — J449 Chronic obstructive pulmonary disease, unspecified: Secondary | ICD-10-CM

## 2012-10-16 MED ORDER — BUDESONIDE 0.5 MG/2ML IN SUSP: 0.5000 mg | Freq: Two times a day (BID) | RESPIRATORY_TRACT | Status: DC

## 2012-10-16 MED ORDER — FORMOTEROL FUMARATE 20 MCG/2ML IN NEBU: 20.0000 ug | INHALATION_SOLUTION | Freq: Two times a day (BID) | RESPIRATORY_TRACT | Status: DC

## 2012-10-16 NOTE — Telephone Encounter (Signed)
I spoke with pt. She reports pt care pharm no longer carries the perforomists. I advised pt we can set her up with either [employer] or [facility] to get these medications through. Order has been placed and RX will be giving to BQ to sign tomorrow and giving back to PCC's. Will forward to [place] to follow up on once done. thanks

## 2012-10-17 NOTE — Telephone Encounter (Signed)
Rxs were signed by BQ and will return to [facility] 10/18/12

## 2012-10-19 ENCOUNTER — Telehealth: Payer: Self-pay | Admitting: Pulmonary Disease

## 2012-10-19 NOTE — Telephone Encounter (Signed)
Called and spoke with patient and she has already heard from [facility]. Medication will be delivered tomorrow, Friday 10/20/12 by [employer]. Rhonda J Cobb

## 2012-10-19 NOTE — Telephone Encounter (Signed)
Spoke with Verlon Au in ref to this, she says she gave signed rx to [place].  Bjorn Loser was this signed rx faxed?

## 2012-12-26 ENCOUNTER — Telehealth: Payer: Self-pay | Admitting: Pulmonary Disease

## 2012-12-26 DIAGNOSIS — J8410 Pulmonary fibrosis, unspecified: Secondary | ICD-10-CM

## 2012-12-26 NOTE — Telephone Encounter (Signed)
Order was sent to [facility] Pt aware and she will call back after PFT to make appt with BQ Nothing further needed

## 2013-01-02 ENCOUNTER — Ambulatory Visit: Payer: Self-pay | Admitting: Pulmonary Disease

## 2013-01-02 DIAGNOSIS — J8410 Pulmonary fibrosis, unspecified: Secondary | ICD-10-CM | POA: Diagnosis not present

## 2013-01-02 LAB — PULMONARY FUNCTION TEST

## 2013-01-03 ENCOUNTER — Encounter: Payer: Self-pay | Admitting: Pulmonary Disease

## 2013-01-03 ENCOUNTER — Telehealth: Payer: Self-pay

## 2013-01-03 DIAGNOSIS — J449 Chronic obstructive pulmonary disease, unspecified: Secondary | ICD-10-CM | POA: Diagnosis not present

## 2013-01-03 NOTE — Telephone Encounter (Signed)
Message copied by Velvet Bathe on Wed Jan 03, 2013  1:35 PM ------      Message from: Max Fickle B      Created: Wed Jan 03, 2013  8:52 AM       A,            Please let her know I have seen her PFTs and would like to see her again in clinic to discuss the results.                  Thanks,      B ------

## 2013-01-03 NOTE — Telephone Encounter (Signed)
Spoke with pt, she has an appt on Tuesday in [place] @ 1:45.  Nothing further needed at this time.  Caulfield,Ashley L, CMA

## 2013-01-09 ENCOUNTER — Ambulatory Visit (INDEPENDENT_AMBULATORY_CARE_PROVIDER_SITE_OTHER): Payer: Medicare Other | Admitting: Pulmonary Disease

## 2013-01-09 ENCOUNTER — Encounter: Payer: Self-pay | Admitting: Pulmonary Disease

## 2013-01-09 VITALS — BP 116/58 | HR 94 | Ht 59.0 in | Wt 144.0 lb

## 2013-01-09 DIAGNOSIS — M810 Age-related osteoporosis without current pathological fracture: Secondary | ICD-10-CM | POA: Diagnosis not present

## 2013-01-09 DIAGNOSIS — J449 Chronic obstructive pulmonary disease, unspecified: Secondary | ICD-10-CM

## 2013-01-09 DIAGNOSIS — J8410 Pulmonary fibrosis, unspecified: Secondary | ICD-10-CM | POA: Diagnosis not present

## 2013-01-09 DIAGNOSIS — J9610 Chronic respiratory failure, unspecified whether with hypoxia or hypercapnia: Secondary | ICD-10-CM

## 2013-01-09 DIAGNOSIS — J9611 Chronic respiratory failure with hypoxia: Secondary | ICD-10-CM

## 2013-01-09 DIAGNOSIS — J849 Interstitial pulmonary disease, unspecified: Secondary | ICD-10-CM

## 2013-01-09 DIAGNOSIS — IMO0002 Reserved for concepts with insufficient information to code with codable children: Secondary | ICD-10-CM

## 2013-01-09 LAB — SEDIMENTATION RATE: Sed Rate: 22 mm/hr (ref 0–22)

## 2013-01-09 MED ORDER — PREDNISONE 10 MG PO TABS: ORAL_TABLET | ORAL | Status: DC

## 2013-01-09 NOTE — Progress Notes (Signed)
Subjective:    Patient ID: Kathryn Ruiz, female    DOB: 1940/05/31, 72 y.o.   MRN: [id number]  Synopsis: Kathryn Ruiz was first followed by Dr. Lamonte Sakai in the winter of 2013 the [place] office and then came to the [facility] office in June 2013 for evaluation of COPD and pulmonary fibrosis. She smoked 3 packs a day for 50 years and quit in 2001. She has a history of esophageal dysphagia requiring balloon dilation on multiple times in the past.  HPI  07/09/2011 F/U [place] -- Kathryn Ruiz is a 72 y/o female with a strong smoking history who saw Dr. Lamonte Sakai a few weeks ago for shortness of breath.  She states that she was referred by [facility] to Dr. Lamonte Sakai for ten or more years of dyspnea with associated cough.  She states that when she was a smoker (3ppd for 50 years) she actually felt a little better because she could cough out her phlegm.  Now she can't so she feels congested a lot.  She states that she notes severe acid reflux symptoms and often feels that chokes on food despite having an esophageal balloon dilation several months ago.  She says that the Spiriva is quite helpful, despite it's high cost.    08/06/11 ROV --Kathryn Ruiz states that she has been using her oxygen and it makes her feel little bit better. However she does not like carrying the portable machine around so she's not always using it when she exerts her self. She still has good relief from the Spiriva. Cough is minimal. She has not scheduled an appointment with gastroenterology because she didn't have to pay the co-pay.  09/28/2011 routine office visit-- This has been a stable interval for Kathryn Ruiz. She has been participating in pulmonary rehabilitation regularly at [facility]. She states it is going slow and she is still at level I which is somewhat frustrating for her. However she does seem to have a positive attitude and states she's to continue going. She is going to take advantage of their dietitian to try to find ways to lose  about 20 pounds. Her cough has been stable and her shortness of breath has not changed since her last visit. She recently saw her gastroenterologist to added Pepcid to a daily PPI therapy. She states that this is made quite a difference in her gastroesophageal reflux disease.  02/07/12 ROV -- Kathryn Ruiz states that she has been doing well aside from a recent sinus infection and cough.  Prior to the sinus infection she had been exercising regularly with lung works and stated that it made her feel "much better". Sounds like her exercise intolerance improved. She stopped going to lung works because her insurance has changed. She is nervous that she still is coverage for her medications and her oxygen. She continues to use 3 L of oxygen with exertion and 4 L at night.  In the last few weeks she's had a sinus infection that was initially treated with a Z-Pak and then eventually amoxicillin. This has improved somewhat but she still has yellow drainage. This is associated with a significant cough. She's not using saline rinses or other over-the-counter decongestants. She has not had a fever or chills and her shortness of breath is at baseline. She continues to use her inhalers regularly. She's not having increasing shortness of breath. She has had increased sputum production, particularly worse in the mornings.   05/30/2012 ROV --Kathryn Ruiz says that for the past several months  he has been having increasing cough and mucus production. She says that this is definitely worse than it was a year ago. Her shortness of breath is at baseline and has not worsened. She continues to use 3 L of oxygen with exertion, Advair, and Spiriva. She feels that she benefits from all of these. She continues to participate in pulmonary rehabilitation. She has had episodes of hoarseness associated with worsened mucus production. She states that Dr. Jenne Campus could not see a clear vocal cord abnormality that was contributing to the hoarseness. She  does have significant postnasal drip and sinus drainage but she feels that some of the mucus she is producing is coming from her lungs. She has not had fevers chills or weight loss. One month ago she was treated with an antibiotic and Tessalon Perles for increasing cough and congestion. She says that both of these helped.   08/01/2012 ROV >> Kathryn Ruiz still has cough and mucus production in the mornings. She describes copious amounts of mucus coming up in the morning which is yellow and green in color. She says that she feels like a continues to get worse. Her shortness of breath is at baseline. She has not had fevers or chills. She does note sinus congestion and sinus drainage. Recently her primary care physician stop the Advair because he thought this is making her mucus production worse. She wants to travel and needs a Designer, jewellery.   10/10/2012 ROV > Kathryn Ruiz says that her shortness of breath has improved a little bit since using the Pulmicort and performance. She still notes some shortness of breath with activity such as carrying groceries. She is not using her oxygen on exertion very often. She has a lot of cough which is sometimes productive of clear sputum. Tessalon Perles help with this. She wants to travel to [place] soon for a craft show but does not want to bring her oxygen concentrator with her. She has been using a nasal spray but continues to have some postnasal drip and sinus congestion. She does not take an over-the-counter antihistamine or decongestant. She has a lot of acid reflux which is not well-controlled at all. She is no longer taking her Pepcid or Prilosec because she says she cannot afford it. She sleeps in a hospital bed with her head elevated.  01/09/2013 ROV >> Kathryn Ruiz has been really struggling because she is weak and can't really do anything at home because of dyspnea.  Her O2 saturation has been really low even on oxygen.  Once she noted a reading of 75% in the morning while  just lying in bed in th emorning (on oxygen).  Her cough has improved since the last visit.  The nebulizer has helped clear mucus.  She can't carry her oxygen because it is too difficulty.  She had a hard time traveling with the oxygen concentrator recently for her brother's funeral.     Past Medical History  Diagnosis Date  . Fibromyalgia   . GERD (gastroesophageal reflux disease)   . COPD (chronic obstructive pulmonary disease)   . CHF (congestive heart failure)      Review of Systems  Constitutional: Negative for fever, chills and unexpected weight change.  HENT: Negative for congestion, nosebleeds, postnasal drip, rhinorrhea and sneezing.   Respiratory: Positive for shortness of breath. Negative for cough and choking.   Cardiovascular: Negative for chest pain and leg swelling.       Objective:   Physical Exam   Filed Vitals:   01/09/13  1340  BP: 116/58  Pulse: 94  Height: 4\' 11"  (1.499 m)  Weight: 144 lb (65.318 kg)  SpO2: 85%  On room air, improved to 89% with rest on RA (she did not bring her oxygen today)  Gen: chronically ill appearing, no acute distress HEENT: NCAT, OP clear, nasopharynx clear PULM:  few crackles bases bilaterally, some wheezing upper lobes CV: RRR, systolic murmur noted, no JVD AB: BS+, soft, nontender, no hsm Ext: warm, trace edema, no clubbing, no cyanosis  05/2011 PFT [facility] >> Ratio 61%, FEV1 1.43 L 91% pred TLC 4.05 L 106% pred DLCO 41% pred 05/2011 CXR >> emphysema with lower lobe fibrosis 06/2011 CT [facility] >> massive emphysema, some fibrosis in lower lobes bilaterally 07/2012 CT chest >> emphsema with questionable fibrotic changes in the bases; no clear honeycombing, mild changes; coronary calcification noted    Assessment & Plan:   COPD (chronic obstructive pulmonary disease) This is severely complicated by the fact she has underlying interstitial lung disease. It doesn't help the fact that she basically never uses oxygen when she exerts  herself.  Plan: -Continue inhaled therapies with Spiriva and piriformis and budesonide -I strongly advised today that she use oxygen more regularly, see below -see discussion of ILD below  Chronic hypoxemic respiratory failure  I have asked that a new durable medical equipment company come out and evaluate her oxygen needs. I believe that there is a smaller, lighter device that could be used.  We did adjust her oxygen requirement up to 4 L continuously.  Pulmonary fibrosis Emphysema and that is the primary problem that causes her shortness of breath and oxygenation issues. However, she does have what looks like honeycombing in her bases that is not clearly consistent with usual interstitial pneumonitis.  I am afraid that this interstitial process is worsening given the changes in her pulmonary function testing. I'm going to see if it steroid responsive by treating with prednisone, but if there is no response we may consider treating as if this is IPF (because of her demographic and honeycombing) with a drug like preventative.  Overall, this makes me very concerned about Kathryn Ruiz's long-term prognosis. I have asked that she bring her family and to see me. Because of her decline in the last 6 months we may talk about the possibility of a transplant evaluation at [facility]. I have yet to bring this up with her.    Updated Medication List Outpatient Encounter Prescriptions as of 01/09/2013  Medication Sig  . amitriptyline (ELAVIL) 10 MG tablet Take 20 mg by mouth at bedtime.   . benzonatate (TESSALON) 100 MG capsule Take 100 mg by mouth 4 (four) times daily as needed for cough.  . budesonide (PULMICORT) 0.5 MG/2ML nebulizer solution Take 2 mLs (0.5 mg total) by nebulization 2 (two) times daily.  . famotidine (PEPCID) 20 MG tablet Take 20 mg by mouth at bedtime.  Marland Kitchen FLUoxetine (PROZAC) 20 MG tablet Take 20 mg by mouth daily.  . formoterol (PERFOROMIST) 20 MCG/2ML nebulizer  solution Take 2 mLs (20 mcg total) by nebulization 2 (two) times daily.  . mometasone (NASONEX) 50 MCG/ACT nasal spray Place 2 sprays into the nose daily.  Marland Kitchen omeprazole (PRILOSEC) 20 MG capsule Take 20 mg by mouth daily.  . traMADol (ULTRAM) 50 MG tablet Take 50 mg by mouth every 6 (six) hours as needed.  Marland Kitchen albuterol (VENTOLIN HFA) 108 (90 BASE) MCG/ACT inhaler Inhale 2 puffs into the lungs 4 (four) times daily.  Marland Kitchen  tiotropium (SPIRIVA) 18 MCG inhalation capsule Place 1 capsule (18 mcg total) into inhaler and inhale daily.

## 2013-01-09 NOTE — Patient Instructions (Signed)
We will set up an overnight oximetry test for you to measure your oxygen level at night We will ask [employer] to go out to your house to give you a better, more lightweight option for your oxygen  Use your oxygen 24 hours a day!  Take the prednisone until you see me next 60mg  daily for one week 50mg  daily for one week 40mg  daily for one week 30mg  daily for one week Then 20mg  daily until you see me next  We will see you next in 6 weeks, please ask your daughter or your husband to come to our office on that visit.

## 2013-01-10 ENCOUNTER — Encounter: Payer: Self-pay | Admitting: Pulmonary Disease

## 2013-01-10 LAB — RHEUMATOID FACTOR: Rhuematoid fact SerPl-aCnc: <10 IU/mL (ref <=14)

## 2013-01-10 LAB — CYCLIC CITRUL PEPTIDE ANTIBODY, IGG: Cyclic Citrullin Peptide Ab: <2 U/mL (ref 0.0–5.0)

## 2013-01-10 LAB — ANA: Anti Nuclear Antibody(ANA): NEGATIVE

## 2013-01-10 LAB — SJOGRENS SYNDROME-A EXTRACTABLE NUCLEAR ANTIBODY: SSA (Ro) (ENA) Antibody, IgG: 9 AU/mL (ref <30)

## 2013-01-10 NOTE — Assessment & Plan Note (Signed)
I have asked that a new durable medical equipment company come out and evaluate her oxygen needs. I believe that there is a smaller, lighter device that could be used.  We did adjust her oxygen requirement up to 4 L continuously.

## 2013-01-10 NOTE — Assessment & Plan Note (Signed)
Emphysema and that is the primary problem that causes her shortness of breath and oxygenation issues. However, she does have what looks like honeycombing in her bases that is not clearly consistent with usual interstitial pneumonitis.  I am afraid that this interstitial process is worsening given the changes in her pulmonary function testing. I'm going to see if it steroid responsive by treating with prednisone, but if there is no response we may consider treating as if this is IPF (because of her demographic and honeycombing) with a drug like preventative.  Overall, this makes me very concerned about Kathryn Ruiz's long-term prognosis. I have asked that she bring her family and to see me. Because of her decline in the last 6 months we may talk about the possibility of a transplant evaluation at [facility]. I have yet to bring this up with her.

## 2013-01-10 NOTE — Assessment & Plan Note (Signed)
This is severely complicated by the fact she has underlying interstitial lung disease. It doesn't help the fact that she basically never uses oxygen when she exerts herself.  Plan: -Continue inhaled therapies with Spiriva and piriformis and budesonide -I strongly advised today that she use oxygen more regularly, see below -see discussion of ILD below

## 2013-01-16 ENCOUNTER — Ambulatory Visit: Payer: Self-pay | Admitting: Pulmonary Disease

## 2013-01-16 DIAGNOSIS — M899 Disorder of bone, unspecified: Secondary | ICD-10-CM | POA: Diagnosis not present

## 2013-01-23 ENCOUNTER — Encounter: Payer: Self-pay | Admitting: Pulmonary Disease

## 2013-02-05 ENCOUNTER — Telehealth: Payer: Self-pay

## 2013-02-05 ENCOUNTER — Encounter: Payer: Self-pay | Admitting: Pulmonary Disease

## 2013-02-05 DIAGNOSIS — M8580 Other specified disorders of bone density and structure, unspecified site: Secondary | ICD-10-CM | POA: Insufficient documentation

## 2013-02-05 NOTE — Telephone Encounter (Signed)
Message copied by Len Blalock on Mon Feb 05, 2013  4:06 PM ------      Message from: Simonne Maffucci B      Created: Mon Feb 05, 2013  3:39 PM       A,            Please let her know that her bone density study showed osteopenia, but not osteoporosis.  This means that her bones are a little weak, but not too bad.  Sometimes it needs to be treated with medicine.            We need to discuss this on the next visit.            Thanks      B ------

## 2013-02-05 NOTE — Telephone Encounter (Signed)
LMTCB X1 

## 2013-02-06 NOTE — Telephone Encounter (Signed)
Message copied by Len Blalock on Tue Feb 06, 2013  9:49 AM ------      Message from: Simonne Maffucci B      Created: Mon Feb 05, 2013  3:39 PM       A,            Please let her know that her bone density study showed osteopenia, but not osteoporosis.  This means that her bones are a little weak, but not too bad.  Sometimes it needs to be treated with medicine.            We need to discuss this on the next visit.            Thanks      B ------

## 2013-02-06 NOTE — Telephone Encounter (Signed)
Pt returned call, I relayed bone density results to her.  I also answered her questions about her 31, stating that we are filling out her paperwork and faxing it to [facility] today.  Nothing further needed at this time.

## 2013-02-09 DIAGNOSIS — M545 Low back pain, unspecified: Secondary | ICD-10-CM | POA: Diagnosis not present

## 2013-02-09 DIAGNOSIS — J449 Chronic obstructive pulmonary disease, unspecified: Secondary | ICD-10-CM | POA: Diagnosis not present

## 2013-02-09 DIAGNOSIS — G8929 Other chronic pain: Secondary | ICD-10-CM | POA: Diagnosis not present

## 2013-02-09 DIAGNOSIS — J329 Chronic sinusitis, unspecified: Secondary | ICD-10-CM | POA: Diagnosis not present

## 2013-02-16 ENCOUNTER — Encounter: Payer: Self-pay | Admitting: Pulmonary Disease

## 2013-02-19 ENCOUNTER — Telehealth: Payer: Self-pay

## 2013-02-19 NOTE — Telephone Encounter (Signed)
Message copied by Len Blalock on Mon Feb 19, 2013  1:50 PM ------      Message from: Simonne Maffucci B      Created: Fri Feb 16, 2013  8:10 PM       A,            Please let her know that her ONO was normal            Thanks      B ------

## 2013-02-19 NOTE — Telephone Encounter (Signed)
Spoke with pt, she is aware.  Nothing further needed.

## 2013-02-23 ENCOUNTER — Encounter: Payer: Self-pay | Admitting: Pulmonary Disease

## 2013-02-23 ENCOUNTER — Ambulatory Visit (INDEPENDENT_AMBULATORY_CARE_PROVIDER_SITE_OTHER): Payer: Medicare Other | Admitting: Pulmonary Disease

## 2013-02-23 VITALS — BP 130/72 | HR 85 | Temp 98.1°F | Ht 59.0 in | Wt 151.0 lb

## 2013-02-23 DIAGNOSIS — J8410 Pulmonary fibrosis, unspecified: Secondary | ICD-10-CM | POA: Diagnosis not present

## 2013-02-23 DIAGNOSIS — J449 Chronic obstructive pulmonary disease, unspecified: Secondary | ICD-10-CM

## 2013-02-23 NOTE — Assessment & Plan Note (Signed)
Kathryn Ruiz's symptoms have worsened significantly in the last 12 months due to progression of fibrosis and COPD.  Her chest CT shows profound emphysema which is the primary problem, but the declining PFTs, worsening symptoms, and fibrotic changes are all concerning.  See discussion in pulmonary fibrosis Continue Pulmicort and Perforomist

## 2013-02-23 NOTE — Patient Instructions (Signed)
We are going to refer you to [facility] for evaluation for a lung transplant. We will call you next week with information about the medicine for pulmonary fibrosis  We will see you back in 6-8 weeks or sooner if needed

## 2013-02-23 NOTE — Progress Notes (Signed)
Subjective:    Patient ID: Kathryn Ruiz, female    DOB: 1940/05/31, 73 y.o.   MRN: [id number]  Synopsis: Dietrich Pates was first followed by Dr. Lamonte Sakai in the winter of 2013 the [place] office and then came to the [facility] office in June 2013 for evaluation of COPD and pulmonary fibrosis. She smoked 3 packs a day for 50 years and quit in 2001. She has a history of esophageal dysphagia requiring balloon dilation on multiple times in the past.  HPI  07/09/2011 F/U Mc[facility]aid -- Kathryn Ruiz is a 73 y/o female with a strong smoking history who saw Dr. Lamonte Sakai a few weeks ago for shortness of breath.  She states that she was referred by [facility] to Dr. Lamonte Sakai for ten or more years of dyspnea with associated cough.  She states that when she was a smoker (3ppd for 50 years) she actually felt a little better because she could cough out her phlegm.  Now she can't so she feels congested a lot.  She states that she notes severe acid reflux symptoms and often feels that chokes on food despite having an esophageal balloon dilation several months ago.  She says that the Spiriva is quite helpful, despite it's high cost.    08/06/11 ROV --Ms. Robison states that she has been using her oxygen and it makes her feel little bit better. However she does not like carrying the portable machine around so she's not always using it when she exerts her self. She still has good relief from the Spiriva. Cough is minimal. She has not scheduled an appointment with gastroenterology because she didn't have to pay the co-pay.  09/28/2011 routine office visit-- This has been a stable interval for Kathryn Ruiz. She has been participating in pulmonary rehabilitation regularly at [facility]. She states it is going slow and she is still at level I which is somewhat frustrating for her. However she does seem to have a positive attitude and states she's to continue going. She is going to take advantage of their dietitian to try to find ways to lose  about 20 pounds. Her cough has been stable and her shortness of breath has not changed since her last visit. She recently saw her gastroenterologist to added Pepcid to a daily PPI therapy. She states that this is made quite a difference in her gastroesophageal reflux disease.  02/07/12 ROV -- Kathryn Ruiz states that she has been doing well aside from a recent sinus infection and cough.  Prior to the sinus infection she had been exercising regularly with [facility] and stated that it made her feel "much better". Sounds like her exercise intolerance improved. She stopped going to lung works because her insurance has changed. She is nervous that she still is coverage for her medications and her oxygen. She continues to use 3 L of oxygen with exertion and 4 L at night.  In the last few weeks she's had a sinus infection that was initially treated with a Z-Pak and then eventually amoxicillin. This has improved somewhat but she still has yellow drainage. This is associated with a significant cough. She's not using saline rinses or other over-the-counter decongestants. She has not had a fever or chills and her shortness of breath is at baseline. She continues to use her inhalers regularly. She's not having increasing shortness of breath. She has had increased sputum production, particularly worse in the mornings.   05/30/2012 ROV --Kathryn Ruiz says that for the past several months  he has been having increasing cough and mucus production. She says that this is definitely worse than it was a year ago. Her shortness of breath is at baseline and has not worsened. She continues to use 3 L of oxygen with exertion, Advair, and Spiriva. She feels that she benefits from all of these. She continues to participate in pulmonary rehabilitation. She has had episodes of hoarseness associated with worsened mucus production. She states that Dr. Tami Ribas could not see a clear vocal cord abnormality that was contributing to the hoarseness. She  does have significant postnasal drip and sinus drainage but she feels that some of the mucus she is producing is coming from her lungs. She has not had fevers chills or weight loss. One month ago she was treated with an antibiotic and Tessalon Perles for increasing cough and congestion. She says that both of these helped.   08/01/2012 ROV >> Kathryn Ruiz still has cough and mucus production in the mornings. She describes copious amounts of mucus coming up in the morning which is yellow and green in color. She says that she feels like a continues to get worse. Her shortness of breath is at baseline. She has not had fevers or chills. She does note sinus congestion and sinus drainage. Recently her primary care physician stop the Advair because he thought this is making her mucus production worse. She wants to travel and needs a Marine scientist.   10/10/2012 ROV > Kathryn Ruiz says that her shortness of breath has improved a little bit since using the Pulmicort and performance. She still notes some shortness of breath with activity such as carrying groceries. She is not using her oxygen on exertion very often. She has a lot of cough which is sometimes productive of clear sputum. Tessalon Perles help with this. She wants to travel to [place] soon for a craft show but does not want to bring her oxygen concentrator with her. She has been using a nasal spray but continues to have some postnasal drip and sinus congestion. She does not take an over-the-counter antihistamine or decongestant. She has a lot of acid reflux which is not well-controlled at all. She is no longer taking her Pepcid or Prilosec because she says she cannot afford it. She sleeps in a hospital bed with her head elevated.  01/09/2013 ROV >> Kathryn Ruiz has been really struggling because she is weak and can't really do anything at home because of dyspnea.  Her O2 saturation has been really low even on oxygen.  Once she noted a reading of 75% in the morning while  just lying in bed in th emorning (on oxygen).  Her cough has improved since the last visit.  The nebulizer has helped clear mucus.  She can't carry her oxygen because it is too difficulty.  She had a hard time traveling with the oxygen concentrator recently for her brother's funeral.    02/23/2013 ROV >> Lavonia Eager tells me today that her mother died of pulmonary fibrosis and CHF in 2002.   Her mother never smoked.  She took the prednisone as we prescribed on the last visit.  She doesn't think that it made her feel any better but it definitely interfered with her sleep and caused her to have a puffy face.  Her breathing was perhaps a little better on the prednisone.  She had a bad spell for 7 days with sinus congestion and a sinus infection since the last visit, and that helped the morning mucus production.  She continues to cough at night.    She is working with [employer] to get a smaller oxygen container, but she hasn't gotten it yet.   Past Medical History  Diagnosis Date  . Fibromyalgia   . GERD (gastroesophageal reflux disease)   . COPD (chronic obstructive pulmonary disease)   . CHF (congestive heart failure)      Review of Systems  Constitutional: Negative for fever, chills and unexpected weight change.  HENT: Negative for congestion, nosebleeds, postnasal drip, rhinorrhea and sneezing.   Respiratory: Positive for shortness of breath. Negative for cough and choking.   Cardiovascular: Negative for chest pain and leg swelling.       Objective:   Physical Exam   Filed Vitals:   02/23/13 1031  BP: 130/72  Pulse: 85  Temp: 98.1 F (36.7 C)  TempSrc: Oral  Height: 4\' 11"  (1.499 m)  Weight: 151 lb (68.493 kg)  SpO2: 94%  On room air, improved to 89% with rest on RA (she did not bring her oxygen today)  Gen: chronically ill appearing, no acute distress HEENT: NCAT, OP clear, nasopharynx clear PULM:  few crackles bases bilaterally, some wheezing upper lobes CV: RRR, systolic murmur  noted, no JVD AB: BS+, soft, nontender, no hsm Ext: warm, trace edema, no clubbing, no cyanosis  150 pack year smoking history, quit 2001 05/2011 PFT [facility] >> Ratio 61%, FEV1 1.43 L 91% pred TLC 4.05 L 106% pred DLCO 41% pred 06/2011 Overnight oximetry >> 96% of the time her O2 saturation was between 80 and 90% (94% under 88%) 06/2011 CT Chest [facility]>> marked emphysema and basilar fibrosis  08/2011 started on 3L O2 with exertion 08/2011 6MW >> 750 ft., O2 saturation 79% on exercise;  09/2011 [facility] 3 11/2011 Pulm rehab [facility] 01/2012 6MW 1010 ft, O2 sat 90% nadir 3L 05/2012 IgE > normal 05/2012 sputum AFB >> 05/2012 sputum fungal >> c. Albicans 07/2012 CT chest >> bilateral severe emphysema; question of honeycombing and interstitial thickening in the bases, not clearly UIP: [facility]Quaid read> no significant progression since the 06/2011 study 12/2012 PFT> ratio 65%, FEV1 1.29 L (85% pred), no change with BD, TLC 3.52L (93% pred), DLCO 6.2 (36% pred) 12/2012 ILD serology panel negative     Assessment & Plan:   COPD (chronic obstructive pulmonary disease) Jo Ann's symptoms have worsened significantly in the last 12 months due to progression of fibrosis and COPD.  Her chest CT shows profound emphysema which is the primary problem, but the declining PFTs, worsening symptoms, and fibrotic changes are all concerning.  See discussion in pulmonary fibrosis Continue Pulmicort and Perforomist  Pulmonary fibrosis We don't have a diagnosis but I am concerned about IPF given her age, lack of abnormal serologic testing results, and lack of response to prednisone since the last visit.  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  In the meantime I am going to ask her to be seen by the [facility] because of the decline we have seen in the last year.  Admittedly her non-compliance with oxygen, acid reflux, and age are all factors  against her candidacy for being a lung transplant recipient.  However she wants to consider all possible options and she is fairly young.  I discussed the importance of compliance today and she understands.  Plan: -start nintedanib -referral to [facility] lung transplant clinic for consideration of lung transplant    Updated  Medication List Outpatient Encounter Prescriptions as of 02/23/2013  Medication Sig  . benzonatate (TESSALON) 100 MG capsule Take 100 mg by mouth 4 (four) times daily as needed for cough.  . budesonide (PULMICORT) 0.5 MG/2ML nebulizer solution Take 2 mLs (0.5 mg total) by nebulization 2 (two) times daily.  . famotidine (PEPCID) 20 MG tablet Take 20 mg by mouth at bedtime.  Marland Kitchen FLUoxetine (PROZAC) 20 MG tablet Take 20 mg by mouth daily.  . formoterol (PERFOROMIST) 20 MCG/2ML nebulizer solution Take 20 mcg by nebulization 2 (two) times daily.  . mometasone (NASONEX) 50 MCG/ACT nasal spray Place 2 sprays into the nose daily.  Marland Kitchen omeprazole (PRILOSEC) 20 MG capsule Take 20 mg by mouth daily.  . pantoprazole (PROTONIX) 20 MG tablet Take 20 mg by mouth daily.  . potassium chloride (KLOR-CON) 20 MEQ packet Take 20 mEq by mouth daily.  . traMADol (ULTRAM) 50 MG tablet Take 50 mg by mouth every 6 (six) hours as needed.  Marland Kitchen amitriptyline (ELAVIL) 10 MG tablet Take 20 mg by mouth at bedtime.   . [DISCONTINUED] albuterol (VENTOLIN HFA) 108 (90 BASE) MCG/ACT inhaler Inhale 2 puffs into the lungs 4 (four) times daily.  . [DISCONTINUED] formoterol (PERFOROMIST) 20 MCG/2ML nebulizer solution Take 2 mLs (20 mcg total) by nebulization 2 (two) times daily.  . [DISCONTINUED] predniSONE (DELTASONE) 10 MG tablet Take 60mg  daily for one week, then take 50mg  daily for one week, then take 40mg  daily for one week, then take 30mg  daily for one week, then take 20mg  daily until you see me again  . [DISCONTINUED] tiotropium (SPIRIVA) 18 MCG inhalation capsule Place 1 capsule (18 mcg total) into inhaler and  inhale daily.

## 2013-02-23 NOTE — Assessment & Plan Note (Signed)
We don't have a diagnosis but I am concerned about IPF given her age, lack of abnormal serologic testing results, and lack of response to prednisone since the last visit.  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  In the meantime I am going to ask her to be seen by the [facility] because of the decline we have seen in the last year.  Admittedly her non-compliance with oxygen, acid reflux, and age are all factors against her candidacy for being a lung transplant recipient.  However she wants to consider all possible options and she is fairly young.  I discussed the importance of compliance today and she understands.  Plan: -start nintedanib -referral to [facility] lung transplant clinic for consideration of lung transplant

## 2013-03-02 ENCOUNTER — Telehealth: Payer: Self-pay | Admitting: Pulmonary Disease

## 2013-03-02 NOTE — Telephone Encounter (Signed)
I faxed these forms yesterday and placed them in BQ's scan file this morning.

## 2013-03-02 NOTE — Telephone Encounter (Signed)
Patient and Pt's daughter aware that the papers have been faxed and that the medication will be sent directly to the pt.  We will call if we hear anything otherwise.  Nothing else needed.

## 2013-03-02 NOTE — Telephone Encounter (Signed)
Kathryn Ruiz has those forms

## 2013-03-02 NOTE — Telephone Encounter (Signed)
Per last visit note: We will call you next week with information about the medicine for pulmonary fibrosis   Dr. Lake Bells please advise what is needed for this patient? Do you want them to start Esbriet? If so we have forms that they need to complete to enroll in thepatient assistance program and to get the shipments started. Please advise. [place] Bing, CMA

## 2013-03-02 NOTE — Telephone Encounter (Signed)
lmomtcb x1 for daughter 

## 2013-03-02 NOTE — Telephone Encounter (Signed)
Caryl Pina do you have these forms for this pt? Albert Bing, CMA

## 2013-03-05 DIAGNOSIS — J329 Chronic sinusitis, unspecified: Secondary | ICD-10-CM | POA: Diagnosis not present

## 2013-03-05 DIAGNOSIS — J40 Bronchitis, not specified as acute or chronic: Secondary | ICD-10-CM | POA: Diagnosis not present

## 2013-03-07 ENCOUNTER — Telehealth: Payer: Self-pay | Admitting: Pulmonary Disease

## 2013-03-07 DIAGNOSIS — R0602 Shortness of breath: Secondary | ICD-10-CM

## 2013-03-07 DIAGNOSIS — R1314 Dysphagia, pharyngoesophageal phase: Secondary | ICD-10-CM

## 2013-03-07 NOTE — Telephone Encounter (Signed)
Called and spoke Kathryn Ruiz. She reports the doctors reviewed pt referral. They are requesting she have a barium swallow done and an echo. They want [place] to order and set this up fro pt. Pt is already aware of what is needed and was fine with this. She would like this done locally instead of at [facility]. Please advise BQ thanks

## 2013-03-08 NOTE — Telephone Encounter (Signed)
Pt had question a/b being ref'ed to [facility] for lung transplant.Kathryn Ruiz

## 2013-03-08 NOTE — Telephone Encounter (Signed)
I spoke with the Kathryn Ruiz and advised that we are going to ask BQ if he is okay with setting up all of the tests so that she can get set up for transplant  See below msg Thanks

## 2013-03-09 ENCOUNTER — Telehealth: Payer: Self-pay | Admitting: Pulmonary Disease

## 2013-03-09 NOTE — Telephone Encounter (Signed)
Form has been received and placed in BQ look at for signature. Will forward to [place] to f/u on once done. thanks

## 2013-03-12 NOTE — Telephone Encounter (Signed)
Yes, that is fine by me; they can be done at [facility] Barium swallow> reason dysphagia and pulmonary fibrosis Echo> reason shortness of breath

## 2013-03-19 ENCOUNTER — Encounter: Payer: Self-pay | Admitting: Pulmonary Disease

## 2013-03-19 NOTE — Telephone Encounter (Signed)
Prior Kathryn Ruiz was approved.  PA reference #: [id number].  Pt and pharmacy aware.  Nothing further needed.

## 2013-03-21 ENCOUNTER — Ambulatory Visit: Payer: Self-pay | Admitting: Pulmonary Disease

## 2013-03-21 DIAGNOSIS — R0602 Shortness of breath: Secondary | ICD-10-CM | POA: Diagnosis not present

## 2013-03-21 DIAGNOSIS — K449 Diaphragmatic hernia without obstruction or gangrene: Secondary | ICD-10-CM | POA: Diagnosis not present

## 2013-03-21 DIAGNOSIS — R1310 Dysphagia, unspecified: Secondary | ICD-10-CM | POA: Diagnosis not present

## 2013-03-21 DIAGNOSIS — K224 Dyskinesia of esophagus: Secondary | ICD-10-CM | POA: Diagnosis not present

## 2013-03-21 DIAGNOSIS — I517 Cardiomegaly: Secondary | ICD-10-CM | POA: Diagnosis not present

## 2013-03-27 ENCOUNTER — Telehealth: Payer: Self-pay

## 2013-03-27 ENCOUNTER — Encounter: Payer: Self-pay | Admitting: Pulmonary Disease

## 2013-03-27 NOTE — Telephone Encounter (Signed)
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 This can sometimes occult swallowing problems and lead to aspiration of food (which causes lung problems) she may need to be seen by a gastroenterologist for this. If she would like to talk to me more about this please let me know. Otherwise she can followup with the [facility] doctors about this.            Thanks,      Ruby Cola             ------

## 2013-03-27 NOTE — Telephone Encounter (Signed)
I explained the results to the patient.  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  I explained that sometimes the esophagus can still narrow after time even after having these surgeries, and is not sure what she should do about this.  She would like to speak to you at some point if you're available to.  She is not available tomorrow morning to speak.  I am willing to convey a message if needed. Thanks, Caryl Pina

## 2013-03-27 NOTE — Telephone Encounter (Signed)
Sure, remind me tomorrow and I can speak with her.

## 2013-04-02 NOTE — Telephone Encounter (Signed)
Kathryn Ruiz, please remind me to call her tomorrow.

## 2013-04-03 ENCOUNTER — Encounter: Payer: Self-pay | Admitting: Pulmonary Disease

## 2013-04-05 ENCOUNTER — Telehealth: Payer: Self-pay | Admitting: Pulmonary Disease

## 2013-04-05 DIAGNOSIS — K222 Esophageal obstruction: Secondary | ICD-10-CM

## 2013-04-05 NOTE — Telephone Encounter (Signed)
Spoke with the pt  She states received letter from [facility] today, stating that they would not be able to pursue her eval for transplant based on her "abnormal esophagus" She states that she understands that her esophagus is narrow, and can cause some dysphagia  She would like to know if Dr Lake Bells can refer her to GI in [place] [facility] for this  Please advise thanks!

## 2013-04-05 NOTE — Telephone Encounter (Signed)
Spoke with the pt and notified of recs per BQ  She verbalized understanding  Order was sent to [facility] for referral  Nothing further needed

## 2013-04-05 NOTE — Telephone Encounter (Signed)
Absolutely Dr. Tiffany Kocher please Sorry to hear this, but it's not too surprising

## 2013-04-12 ENCOUNTER — Encounter: Payer: Self-pay | Admitting: Pulmonary Disease

## 2013-04-12 ENCOUNTER — Ambulatory Visit (INDEPENDENT_AMBULATORY_CARE_PROVIDER_SITE_OTHER): Payer: Medicare Other | Admitting: Pulmonary Disease

## 2013-04-12 VITALS — BP 124/66 | HR 90 | Ht 59.0 in | Wt 150.0 lb

## 2013-04-12 DIAGNOSIS — J8410 Pulmonary fibrosis, unspecified: Secondary | ICD-10-CM

## 2013-04-12 DIAGNOSIS — J449 Chronic obstructive pulmonary disease, unspecified: Secondary | ICD-10-CM | POA: Diagnosis not present

## 2013-04-12 DIAGNOSIS — R1319 Other dysphagia: Secondary | ICD-10-CM | POA: Insufficient documentation

## 2013-04-12 DIAGNOSIS — R4789 Other speech disturbances: Secondary | ICD-10-CM

## 2013-04-12 DIAGNOSIS — R1310 Dysphagia, unspecified: Secondary | ICD-10-CM | POA: Insufficient documentation

## 2013-04-12 DIAGNOSIS — R4702 Dysphasia: Secondary | ICD-10-CM

## 2013-04-12 NOTE — Progress Notes (Signed)
Subjective:    Patient ID: Kathryn Ruiz, female    DOB: 1940/05/31, 73 y.o.   MRN: [id number]  Synopsis: Kathryn Ruiz was first followed by Dr. Lamonte Sakai in the winter of 2013 the [place] office and then came to the [facility] office in June 2013 for evaluation of COPD and pulmonary fibrosis. She smoked 3 packs a day for 50 years and quit in 2001. She has a history of esophageal dysphagia requiring balloon dilation on multiple times in the past.  HPI  07/09/2011 F/U Kathryn Ruiz -- Kathryn Ruiz is a 73 y/o female with a strong smoking history who saw Dr. Lamonte Sakai a few weeks ago for shortness of breath.  She states that she was referred by [facility] to Dr. Lamonte Sakai for ten or more years of dyspnea with associated cough.  She states that when she was a smoker (3ppd for 50 years) she actually felt a little better because she could cough out her phlegm.  Now she can't so she feels congested a lot.  She states that she notes severe acid reflux symptoms and often feels that chokes on food despite having an esophageal balloon dilation several months ago.  She says that the Spiriva is quite helpful, despite it's high cost.    08/06/11 ROV --Kathryn Ruiz states that she has been using her oxygen and it makes her feel little bit better. However she does not like carrying the portable machine around so she's not always using it when she exerts her self. She still has good relief from the Spiriva. Cough is minimal. She has not scheduled an appointment with gastroenterology because she didn't have to pay the co-pay.  09/28/2011 routine office visit-- This has been a stable interval for Kathryn Ruiz. She has been participating in pulmonary rehabilitation regularly at [facility]. She states it is going slow and she is still at level I which is somewhat frustrating for her. However she does seem to have a positive attitude and states she's to continue going. She is going to take advantage of their dietitian to try to find ways to lose  about 20 pounds. Her cough has been stable and her shortness of breath has not changed since her last visit. She recently saw her gastroenterologist to added Pepcid to a daily PPI therapy. She states that this is made quite a difference in her gastroesophageal reflux disease.  02/07/12 ROV -- Kathryn Ruiz states that she has been doing well aside from a recent sinus infection and cough.  Prior to the sinus infection she had been exercising regularly with [facility] and stated that it made her feel "much better". Sounds like her exercise intolerance improved. She stopped going to lung works because her insurance has changed. She is nervous that she still is coverage for her medications and her oxygen. She continues to use 3 L of oxygen with exertion and 4 L at night.  In the last few weeks she's had a sinus infection that was initially treated with a Z-Pak and then eventually amoxicillin. This has improved somewhat but she still has yellow drainage. This is associated with a significant cough. She's not using saline rinses or other over-the-counter decongestants. She has not had a fever or chills and her shortness of breath is at baseline. She continues to use her inhalers regularly. She's not having increasing shortness of breath. She has had increased sputum production, particularly worse in the mornings.   05/30/2012 ROV --Kathryn Ruiz says that for the past several months  he has been having increasing cough and mucus production. She says that this is definitely worse than it was a year ago. Her shortness of breath is at baseline and has not worsened. She continues to use 3 L of oxygen with exertion, Advair, and Spiriva. She feels that she benefits from all of these. She continues to participate in pulmonary rehabilitation. She has had episodes of hoarseness associated with worsened mucus production. She states that Dr. Tami Ribas could not see a clear vocal cord abnormality that was contributing to the hoarseness. She  does have significant postnasal drip and sinus drainage but she feels that some of the mucus she is producing is coming from her lungs. She has not had fevers chills or weight loss. One month ago she was treated with an antibiotic and Tessalon Perles for increasing cough and congestion. She says that both of these helped.   08/01/2012 ROV >> Kathryn Ruiz still has cough and mucus production in the mornings. She describes copious amounts of mucus coming up in the morning which is yellow and green in color. She says that she feels like a continues to get worse. Her shortness of breath is at baseline. She has not had fevers or chills. She does note sinus congestion and sinus drainage. Recently her primary care physician stop the Advair because he thought this is making her mucus production worse. She wants to travel and needs a Marine scientist.   10/10/2012 ROV > Kathryn Ruiz says that her shortness of breath has improved a little bit since using the Pulmicort and performance. She still notes some shortness of breath with activity such as carrying groceries. She is not using her oxygen on exertion very often. She has a lot of cough which is sometimes productive of clear sputum. Tessalon Perles help with this. She wants to travel to [place] soon for a craft show but does not want to bring her oxygen concentrator with her. She has been using a nasal spray but continues to have some postnasal drip and sinus congestion. She does not take an over-the-counter antihistamine or decongestant. She has a lot of acid reflux which is not well-controlled at all. She is no longer taking her Pepcid or Prilosec because she says she cannot afford it. She sleeps in a hospital bed with her head elevated.  01/09/2013 ROV >> Kathryn Ruiz has been really struggling because she is weak and can't really do anything at home because of dyspnea.  Her O2 saturation has been really low even on oxygen.  Once she noted a reading of 75% in the morning while  just lying in bed in th emorning (on oxygen).  Her cough has improved since the last visit.  The nebulizer has helped clear mucus.  She can't carry her oxygen because it is too difficulty.  She had a hard time traveling with the oxygen concentrator recently for her brother's funeral.    02/23/2013 ROV >> Kathryn Ruiz tells me today that her mother died of pulmonary fibrosis and CHF in 2002.   Her mother never smoked.  She took the prednisone as we prescribed on the last visit.  She doesn't think that it made her feel any better but it definitely interfered with her sleep and caused her to have a puffy face.  Her breathing was perhaps a little better on the prednisone.  She had a bad spell for 7 days with sinus congestion and a sinus infection since the last visit, and that helped the morning mucus production.  She continues to cough at night.    She is working with [employer] to get a smaller oxygen container, but she hasn't gotten it yet.  04/12/2013 ROV >> Kathryn Ruiz wasn't able to take the Ofev because of upset stomach, nausea, and vomiting.  She has noted food getting stuck in her esophagus at times.  She was throwing up continually, not eating and having dry heaves.  She felt absolutely miserable. This reminded her husband as someone on chemotherapy.  Today she feels a little better, but it has been a tough week.  She still doesn't feel quit right.   Her husband knows that there has been a decline over the last year or so.     Past Medical History  Diagnosis Date  . Fibromyalgia   . GERD (gastroesophageal reflux disease)   . COPD (chronic obstructive pulmonary disease)   . CHF (congestive heart failure)      Review of Systems  Constitutional: Negative for fever, chills and unexpected weight change.  HENT: Negative for congestion, nosebleeds, postnasal drip, rhinorrhea and sneezing.   Respiratory: Positive for shortness of breath. Negative for cough and choking.   Cardiovascular: Negative for chest pain  and leg swelling.       Objective:   Physical Exam   Filed Vitals:   04/12/13 1329  BP: 124/66  Pulse: 90  Height: 4\' 11"  (1.499 m)  Weight: 150 lb (68.04 kg)  SpO2: 95%  On room air, improved to 89% with rest on RA (she did not bring her oxygen today)  Gen: chronically ill appearing, no acute distress HEENT: NCAT, OP clear, nasopharynx clear PULM:  few crackles bases bilaterally, some wheezing upper lobes CV: RRR, systolic murmur noted, no JVD AB: BS+, soft, nontender, no hsm Ext: warm, trace edema, no clubbing, no cyanosis  150 pack year smoking history, quit 2001 05/2011 PFT [facility] >> Ratio 61%, FEV1 1.43 L 91% pred TLC 4.05 L 106% pred DLCO 41% pred 06/2011 Overnight oximetry >> 96% of the time her O2 saturation was between 80 and 90% (94% under 88%) 06/2011 CT Chest [facility]>> marked emphysema and basilar fibrosis  08/2011 started on 3L O2 with exertion 08/2011 6MW >> 750 ft., O2 saturation 79% on exercise;  09/2011 [facility] 3 11/2011 Pulm rehab [facility] 01/2012 6MW 1010 ft, O2 sat 90% nadir 3L 05/2012 IgE > normal 05/2012 sputum AFB >> 05/2012 sputum fungal >> c. Albicans 07/2012 CT chest >> bilateral severe emphysema; question of honeycombing and interstitial thickening in the bases, not clearly UIP: Kathryn Ruiz read> no significant progression since the 06/2011 study 12/2012 PFT> ratio 65%, FEV1 1.29 L (85% pred), no change with BD, TLC 3.52L (93% pred), DLCO 6.2 (36% pred) 12/2012 ILD serology panel negative\ February 2015 barium swallow> mild narrowing of the lower cervical esophagus which results in transient obstruction of the 12 mm barium pill, high grade narrowing of the distal esophagus just proximal to a moderate sized partially reducible hiatal hernia. Maximal measured luminal diameter is 4 mm. 12 mm barium pill would not pass. 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 02/2013 Echo> Normal LVEF, mild LVH and diastolic dysfunction; normal RV and RVSP 03/2013 rejected for consideration for  lung transplant [facility] (dysphagia)     Assessment & Plan:   COPD (chronic obstructive pulmonary disease) Continue Pulmicort, formoterol twice a day  Pulmonary fibrosis The differential diagnosis here is UIP versus chronic aspiration. Because of her age and severe lung disease we are unable to perform a biopsy safely.  We are trying to treat her empirically for UIP with Ofev, but unfortunately she has not tolerated it very well. In the lengthy conversation with her and her husband today I explained that I feel strongly she should give it a thorough try before giving up. Her primary problem was nausea and vomiting.  Plan: -Restart Ofev at a lower dose (100 mg twice a day) -Use BRAT diet to help minimize GI symptoms  Dysphasia See barium swallow report above.  Plan: -Refer to gastroenterology    Updated Medication List Outpatient Encounter Prescriptions as of 04/12/2013  Medication Sig  . amitriptyline (ELAVIL) 10 MG tablet Take 20 mg by mouth at bedtime.   . benzonatate (TESSALON) 100 MG capsule Take 100 mg by mouth 4 (four) times daily as needed for cough.  . budesonide (PULMICORT) 0.5 MG/2ML nebulizer solution Take 2 mLs (0.5 mg total) by nebulization 2 (two) times daily.  . famotidine (PEPCID) 20 MG tablet Take 20 mg by mouth at bedtime.  Marland Kitchen FLUoxetine (PROZAC) 20 MG tablet Take 20 mg by mouth daily.  . formoterol (PERFOROMIST) 20 MCG/2ML nebulizer solution Take 20 mcg by nebulization 2 (two) times daily.  . mometasone (NASONEX) 50 MCG/ACT nasal spray Place 2 sprays into the nose daily.  Marland Kitchen omeprazole (PRILOSEC) 20 MG capsule Take 20 mg by mouth daily.  . pantoprazole (PROTONIX) 20 MG tablet Take 20 mg by mouth daily.  . potassium chloride (KLOR-CON) 20 MEQ packet Take 20 mEq by mouth daily.  . traMADol (ULTRAM) 50 MG tablet Take 50 mg by mouth every 6 (six) hours as needed.

## 2013-04-12 NOTE — Assessment & Plan Note (Signed)
The differential diagnosis here is UIP versus chronic aspiration. Because of her age and severe lung disease we are unable to perform a biopsy safely.  We are trying to treat her empirically for UIP with Ofev, but unfortunately she has not tolerated it very well. In the lengthy conversation with her and her husband today I explained that I feel strongly she should give it a thorough try before giving up. Her primary problem was nausea and vomiting.  Plan: -Restart Ofev at a lower dose (100 mg twice a day) -Use BRAT diet to help minimize GI symptoms

## 2013-04-12 NOTE — Assessment & Plan Note (Signed)
See barium swallow report above.  Plan: -Refer to gastroenterology

## 2013-04-12 NOTE — Assessment & Plan Note (Signed)
Continue Pulmicort, formoterol twice a day

## 2013-04-12 NOTE — Patient Instructions (Addendum)
We are going to decrease the dose of the medicine to 100mg  by mouth twice a day Eat bland foods when you start the diet back Stay active We will see you back in 2 months or sooner if needed   Diet The clear liquid diet consists of foods that are liquid or will become liquid at room temperature. Examples of foods allowed on a clear liquid diet include fruit juice, broth or bouillon, gelatin, or frozen ice pops. You should be able to see through the liquid. The purpose of this diet is to provide the necessary fluids, electrolytes (such as sodium and potassium), and energy to keep the body functioning during times when you are not able to consume a regular diet. A clear liquid diet should not be continued for long periods of time, as it is not nutritionally adequate.  A CLEAR LIQUID DIET MAY BE NEEDED:  When a sudden-onset (acute) condition occurs before or after surgery.   As the first step in oral feeding.   For fluid and electrolyte replacement in diarrheal diseases.   As a diet before certain medical tests are performed.  ADEQUACY The clear liquid diet is adequate only in ascorbic acid, according to the Recommended Dietary Allowances of the [employer].  CHOOSING FOODS Breads and Starches  Allowed: None are allowed.   Avoid: All are to be avoided.  Vegetables  Allowed: Strained vegetable juices.   Avoid: Any others.  Fruit  Allowed: Strained fruit juices and fruit drinks. Include 1 serving of citrus or vitamin C-enriched fruit juice daily.   Avoid: Any others.  Meat and Meat Substitutes  Allowed: None are allowed.   Avoid: All are to be avoided.  Milk Products  Allowed: None are allowed.   Avoid: All are to be avoided.  Soups and Combination Foods  Allowed: Clear bouillon, broth, or strained broth-based soups.   Avoid: Any others.  Desserts and Sweets  Allowed: Sugar, honey. High-protein gelatin. Flavored  gelatin, ices, or frozen ice pops that do not contain milk.   Avoid: Any others.  Fats and Oils  Allowed: None are allowed.   Avoid: All are to be avoided.  Beverages  Allowed: Cereal beverages, coffee (regular or decaffeinated), tea, or soda at the discretion of your health care provider.   Avoid: Any others.  Condiments  Allowed: Salt.   Avoid: Any others, including pepper.  Supplements  Allowed: Liquid nutrition beverages that you can see through.   Avoid: Any others that contain lactose or fiber. SAMPLE MEAL PLAN Breakfast  4 oz (120 mL) strained orange juice.   to 1 cup (120 to 240 mL) gelatin (plain or fortified).  1 cup (240 mL) beverage (coffee or tea).  Sugar, if desired. Midmorning Snack   cup (120 mL) gelatin (plain or fortified). Lunch  1 cup (240 mL) broth or consomm.  4 oz (120 mL) strained grapefruit juice.   cup (120 mL) gelatin (plain or fortified).  1 cup (240 mL) beverage (coffee or tea).  Sugar, if desired. Midafternoon Snack   cup (120 mL) fruit ice.   cup (120 mL) strained fruit juice. Dinner  1 cup (240 mL) broth or consomm.   cup (120 mL) cranberry juice.   cup (120 mL) flavored gelatin (plain or fortified).  1 cup (240 mL) beverage (coffee or tea).  Sugar, if desired. Evening Snack  4 oz (120 mL) strained apple juice (vitamin C-fortified).   cup (120 mL) flavored gelatin (plain or fortified). MAKE SURE YOU:  Understand these instructions.  Will watch your child's condition.  Will get help right away if your child is not doing well or gets worse. Document Released: 01/11/2005 Document Revised: 09/13/2012 Document Reviewed: 06/13/2012 [facility] Patient Information 2014 [place].

## 2013-04-16 ENCOUNTER — Telehealth: Payer: Self-pay | Admitting: Pulmonary Disease

## 2013-04-16 DIAGNOSIS — R4702 Dysphasia: Secondary | ICD-10-CM

## 2013-04-16 NOTE — Telephone Encounter (Signed)
OK, please refer to Dr. Hilarie Fredrickson with [facility]

## 2013-04-16 NOTE — Telephone Encounter (Signed)
Called and spoke with pt. She is requesting a referral to GI from Dr. Lake Bells. She wants to be seen in [facility] but was told needs a referral. Please advise thanks

## 2013-04-16 NOTE — Telephone Encounter (Signed)
Pt aware and referral placed. Nothing further needed 

## 2013-04-17 ENCOUNTER — Encounter: Payer: Self-pay | Admitting: Internal Medicine

## 2013-04-19 ENCOUNTER — Encounter: Payer: Self-pay | Admitting: Pulmonary Disease

## 2013-04-23 ENCOUNTER — Other Ambulatory Visit: Payer: Self-pay

## 2013-04-23 ENCOUNTER — Telehealth: Payer: Self-pay | Admitting: Pulmonary Disease

## 2013-04-23 NOTE — Telephone Encounter (Signed)
Delta and verified with the pharmacist, Ivin Booty, that the order for Ofev 100mg  1 tab bid had been placed.  She stated that she would make sure this was taken care of.  Called the pt, she is aware that this has been taken care of.  Nothing further needed.

## 2013-04-23 NOTE — Telephone Encounter (Signed)
Spoke with pt, she has not received the shipment of her cellcept 100mg  yet.  I told her I placed this order myself verbally.  I will call the pharmacy and let her know what the hold up is.

## 2013-04-23 NOTE — Telephone Encounter (Signed)
The prescription was Ofev, not Cellcept.

## 2013-05-01 ENCOUNTER — Encounter: Payer: Self-pay | Admitting: Pulmonary Disease

## 2013-05-02 ENCOUNTER — Telehealth: Payer: Self-pay | Admitting: Pulmonary Disease

## 2013-05-02 DIAGNOSIS — J9611 Chronic respiratory failure with hypoxia: Secondary | ICD-10-CM

## 2013-05-02 NOTE — Telephone Encounter (Signed)
Called spoke with pt. She reports she uses 4 l/m O2 24/7 basically. She is needing [place] to send an order over to [facility] letting them know this so they can bring her more tanks. Also she is wanting more tanks for her portable O2 as well. Please advise BQ if okay to do so? thanks

## 2013-05-02 NOTE — Telephone Encounter (Signed)
Fine by me 

## 2013-05-03 ENCOUNTER — Telehealth: Payer: Self-pay | Admitting: Pulmonary Disease

## 2013-05-03 NOTE — Telephone Encounter (Signed)
I have faxed order over to [facility] again.  LMTCB x1 to make [facility] aware

## 2013-05-03 NOTE — Telephone Encounter (Signed)
Order was sent to [facility] for additional o2 tanks  Pt aware and nothing further needed

## 2013-05-03 NOTE — Telephone Encounter (Signed)
Pt's daughter, Juliann Pulse, returned triage's call.  Advised Juliann Pulse order was faxed again to [facility].  Satira Anis

## 2013-05-03 NOTE — Telephone Encounter (Signed)
Left detailed message on Kathryn Ruiz's VM advising we have refaxed this over. If she had any questions then to call us. Nothing further needed

## 2013-05-30 ENCOUNTER — Telehealth: Payer: Self-pay | Admitting: Pulmonary Disease

## 2013-05-30 NOTE — Telephone Encounter (Signed)
Called and spoke with mandy from [facility].  She stated that they do not do any of the nursing care.  They do the respiratory only.  So for the assessment they can to the clinical assessment and the spirometry. They can also go over nutrition with the pt.  Hope this helps. thanks

## 2013-05-30 NOTE — Telephone Encounter (Signed)
I guess I don't know exactly what service they are offering other than acting as a mediator for the pharmacy Do they provide home health or home nursing assistance at all (drug monitoring, lab monitoring, helping with nausea, etc?)  Please call to clarify

## 2013-05-30 NOTE — Telephone Encounter (Signed)
Called and spoke with mandy from [facility] and she stated that the pts rx for the OFEV has been sent to the pts local pharmacy due to her insurance is out of network.  mandy stated that they can still do the assessment for this pt if BQ would like this done.  BQ please advise. thanks

## 2013-05-31 ENCOUNTER — Encounter: Payer: Self-pay | Admitting: Internal Medicine

## 2013-06-01 NOTE — Telephone Encounter (Signed)
LMOM TCB x1 for [facility]

## 2013-06-01 NOTE — Telephone Encounter (Signed)
Please have them do the assessment that they offer thanks

## 2013-06-01 NOTE — Telephone Encounter (Signed)
Spoke with Mandy--assessment to be completed Monday/Tuesday next week (5/11-5/12) Will be faxed to main and triage fax once completed.  Will send to [facility] to keep any eye out for this to be faxed over for [place] to review.  Nothing further needed.

## 2013-06-06 ENCOUNTER — Encounter: Payer: Self-pay | Admitting: Internal Medicine

## 2013-06-06 ENCOUNTER — Ambulatory Visit (INDEPENDENT_AMBULATORY_CARE_PROVIDER_SITE_OTHER): Payer: Medicare Other | Admitting: Internal Medicine

## 2013-06-06 VITALS — BP 126/68 | HR 72 | Ht 59.0 in | Wt 146.0 lb

## 2013-06-06 DIAGNOSIS — R1310 Dysphagia, unspecified: Secondary | ICD-10-CM | POA: Diagnosis not present

## 2013-06-06 DIAGNOSIS — K219 Gastro-esophageal reflux disease without esophagitis: Secondary | ICD-10-CM | POA: Diagnosis not present

## 2013-06-06 DIAGNOSIS — R933 Abnormal findings on diagnostic imaging of other parts of digestive tract: Secondary | ICD-10-CM

## 2013-06-06 DIAGNOSIS — K222 Esophageal obstruction: Secondary | ICD-10-CM | POA: Diagnosis not present

## 2013-06-06 MED ORDER — PANTOPRAZOLE SODIUM 40 MG PO TBEC: 40.0000 mg | DELAYED_RELEASE_TABLET | Freq: Every day | ORAL | Status: DC

## 2013-06-06 MED ORDER — FAMOTIDINE 20 MG PO TABS: 20.0000 mg | ORAL_TABLET | Freq: Every day | ORAL | Status: DC

## 2013-06-06 NOTE — Patient Instructions (Signed)
You have been scheduled for an endoscopy with propofol. Please follow written instructions given to you at your visit today. If you use inhalers (even only as needed), please bring them with you on the day of your procedure. Your physician has requested that you go to www.startemmi.com and enter the access code given to you at your visit today. This web site gives a general overview about your procedure. However, you should still follow specific instructions given to you by our office regarding your preparation for the procedure.  We have sent the following medications to your pharmacy for you to pick up at your convenience: Protonix 40 daily and pepcid at bedtime

## 2013-06-06 NOTE — Progress Notes (Signed)
Patient ID: Kathryn Ruiz, female   DOB: [birth year], 73 y.o.   MRN: [id number] HPI: [name] is a 73 yo female with past medical history of oxygen-dependent COPD, CHF, esophageal stricture status post dilation, GERD, fibromyalgia, and depression who is seen in consultation at the request of Dr. Lake Bells to evaluate esophageal dysphagia and abnormal barium swallow. She is here today with her husband. She reports solid greater than liquid food dysphagia which has been present for many years but worsening over the last 6-12 months. She reports previous esophageal dilatation performed in [place] by Dr. Henrene Hawking.  In the past dilation helped with her symptoms. She reports food can "catch" in her mid and lower chest. She reports she has learned to stop eating and not drink fluids immediately because they will often "bubble back up and come out". This is worse with solid foods such as breads or meats but can occur with liquids. She denies heartburn but does report "acid reflux" which she associates with regurgitation of fluid, particularly at night. She also reports occasional dry heaves. She's been taking pantoprazole 40 mg but needs a refill also uses Pepcid at night. She reports a stable weight. Denies change in bowel habits, diarrhea or constipation. No blood in her stool or melena.  Past Medical History  Diagnosis Date  . Fibromyalgia   . GERD (gastroesophageal reflux disease)   . COPD (chronic obstructive pulmonary disease)   . CHF (congestive heart failure)   . Esophageal stricture   . Depression     Past Surgical History  Procedure Laterality Date  . Sinus surgery  1999  . Appendectomy  1954  . Cataract extraction  1998    both  . Neuroma surgery  2001  . Foot irrigation  2002  . Disectomy  2003  . Vocal cord biopsy  2005  . Shoulder surgery  2010    Current Outpatient Prescriptions  Medication Sig Dispense Refill  . budesonide (PULMICORT) 0.5 MG/2ML nebulizer solution Take 2 mLs  (0.5 mg total) by nebulization 2 (two) times daily.  120 mL  12  . famotidine (PEPCID) 20 MG tablet Take 1 tablet (20 mg total) by mouth at bedtime.  90 tablet  3  . FLUoxetine (PROZAC) 20 MG tablet Take 20 mg by mouth daily.      . fluticasone (VERAMYST) 27.5 MCG/SPRAY nasal spray Place 2 sprays into the nose daily.      . formoterol (PERFOROMIST) 20 MCG/2ML nebulizer solution Take 20 mcg by nebulization 2 (two) times daily.      . furosemide (LASIX) 20 MG tablet Take 20 mg by mouth.      . Nintedanib Esylate (OFEV) 100 MG CAPS Take 100 mg by mouth 2 (two) times daily.      . potassium chloride (KLOR-CON) 20 MEQ packet Take 20 mEq by mouth daily.      . traMADol (ULTRAM) 50 MG tablet Take 50 mg by mouth every 6 (six) hours as needed.      . pantoprazole (PROTONIX) 40 MG tablet Take 1 tablet (40 mg total) by mouth daily.  90 tablet  3   No current facility-administered medications for this visit.    No Known Allergies  Family History  Problem Relation Age of Onset  . Heart attack Father 50  . Heart disease Mother     History  Substance Use Topics  . Smoking status: Former Smoker -- 3.00 packs/day for 50 years    Types: Cigarettes    Quit  date: 01/26/1999  . Smokeless tobacco: Never Used  . Alcohol Use: No    ROS: As per history of present illness, otherwise negative  BP 126/68  Pulse 72  Ht 4\' 11"  (1.499 m)  Wt 146 lb (66.225 kg)  BMI 29.47 kg/m2 Constitutional: Well-developed and well-nourished. No distress. HEENT: Normocephalic and atraumatic. Oropharynx is clear and moist. No oropharyngeal exudate. Conjunctivae are normal.  No scleral icterus. She by nasal cannula in place Neck: Neck supple. Trachea midline. Cardiovascular: Normal rate, regular rhythm and intact distal pulses. No M/R/G Pulmonary/chest: Effort normal with distant breath sounds, no wheezing rales or rhonchi Abdominal: Soft, obese, nontender, nondistended. Bowel sounds active throughout.  Extremities: no  clubbing, cyanosis, or edema Neurological: Alert and oriented to person place and time. Skin: Skin is warm and dry. No rashes noted. Psychiatric: Normal mood and affect. Behavior is normal.  RELEVANT LABS AND IMAGING: CBC    Component Value Date/Time   WBC 9.2 06/19/2009 1306   WBC 8.9 05/03/2008 1302   RBC 5.23 06/19/2009 1306   RBC 4.61 05/03/2008 1302   HGB 14.5 06/19/2009 1306   HGB 13.9 05/03/2008 1302   HCT 43.8 06/19/2009 1306   HCT 41.0 05/03/2008 1302   PLT 265 06/19/2009 1306   PLT 295 05/03/2008 1302   MCV 83.6 06/19/2009 1306   MCV 89 05/03/2008 1302   MCH 27.7 06/19/2009 1306   MCH 30.1 05/03/2008 1302   MCHC 33.1 06/19/2009 1306   MCHC 33.8 05/03/2008 1302   RDW 15.1* 06/19/2009 1306   RDW 12.9 05/03/2008 1302   LYMPHSABS 2.8 06/19/2009 1306   LYMPHSABS 2.7 05/03/2008 1302   MONOABS 0.7 06/19/2009 1306   EOSABS 0.2 06/19/2009 1306   EOSABS 0.2 05/03/2008 1302   BASOSABS 0.1 06/19/2009 1306   BASOSABS 0.1 05/03/2008 1302    CMP     Component Value Date/Time   NA 134 06/19/2009 1244   K 3.8 06/19/2009 1244   CL 102 06/19/2009 1244   CO2 19 06/19/2009 1244   GLUCOSE 92 06/19/2009 1244   BUN 17 06/19/2009 1244   CREATININE 0.7 06/19/2009 1244   CALCIUM 9.2 06/19/2009 1244   PROT 7.2 06/19/2009 1244   AST 29 06/19/2009 1244   ALT 23 06/19/2009 1244   ALKPHOS 132* 06/19/2009 1244   BILITOT 0.70 06/19/2009 1244   Barium swallow -- performed 03/21/2013 impression 1. There is mild narrowing of the lower cervical esophagus which resulted in transient obstruction to the passage of a 12 mm barium pill 2. There is high-grade narrowing at the distal esophagus just proximal to a moderate sized partially reducible hiatal hernia. Here the maximal measured luminal diameter is 4 mm. A 12 mm barium pill would not pass this region. 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  CT CHEST WITHOUT CONTRAST -- 2014   Technique:  Multidetector CT imaging of the chest was performed following the standard protocol without IV  contrast.   Comparison: No priors.   Findings:   Mediastinum: Heart size is normal. There is no significant pericardial fluid, thickening or pericardial calcification. There is atherosclerosis of the thoracic aorta, the great vessels of the mediastinum and the coronary arteries, including calcified atherosclerotic plaque in the left main, left anterior descending and right coronary arteries. No pathologically enlarged mediastinal or hilar lymph nodes. Please note that accurate exclusion of hilar adenopathy is limited on noncontrast CT scans.  There is a small hiatal hernia.   Lungs/Pleura: There is a background of severe centrilobular emphysema.  Mild diffuse bronchial wall thickening is noted.  In the lung bases bilaterally predominately involving the lower lobes posteriorly and posterolaterally there are areas of subpleural reticulation with some peripheral bronchiolectasis.  In some regions and there may be evidence of early honeycombing, however, this is not definitive at this time.  No frank traction bronchiectasis is noted.  No parenchymal banding.  Inspiratory and expiratory imaging is unremarkable.  No acute consolidative air space disease.  No pleural effusions.   Upper Abdomen: Atherosclerosis.   Musculoskeletal: There are no aggressive appearing lytic or blastic lesions noted in the visualized portions of the skeleton.   IMPRESSION: 1.  Mild diffuse bronchial wall thickening and severe centrilobular emphysema.  The imaging findings are compatible with underlying COPD. 2.  In addition, in the extreme lung bases there are some mild fibrotic changes in the lung.  This pattern is not that specific, however, there is a suggestion of early honeycombing.  Given the basilar predominance of these fibrotic findings, this may in fact represent a very early manifestation of usual interstitial pneumonia (UIP), however, this is not a definitive at this time and these findings  may alternatively represent post infectious or inflammatory areas of scarring.  Repeat high resolution chest CT in 6-12 months may be useful to monitor for temporal changes in this pattern of disease. 3. Atherosclerosis, including left main and two-vessel coronary artery disease. Please note that although the presence of coronary artery calcium documents the presence of coronary artery disease, the severity of this disease and any potential stenosis cannot be assessed on this non-gated CT examination.  Assessment for potential risk factor modification, dietary therapy or pharmacologic therapy may be warranted, if clinically indicated. 4.  Small hiatal hernia.       ASSESSMENT/PLAN: 73 yo female with past medical history of oxygen-dependent COPD, CHF, esophageal stricture status post dilation, GERD, fibromyalgia, and depression who is seen in consultation at the request of Dr. Lake Bells to evaluate esophageal dysphagia and abnormal barium swallow.  1.  Esophageal dysphagia/esophageal stricture/abnormal barium swallow/GERD -- she certainly does have an esophageal stricture and hiatus hernia as seen on brain swallow in February. She's had previous esophageal dilatation. We discussed her current symptoms and she would like to proceed with esophageal dilation if felt medically safe. I am concerned about her oxygen-dependent COPD and would like to discuss upper endoscopy further with Dr. Lake Bells. If this test is performed it will need to be performed at the hospital for monitored anesthesia care. It would be higher than baseline risk. We discussed the test including the risks and benefits and she is agreeable to proceed. I will refill her pantoprazole 40 mg daily and she can continue to use Pepcid at bedtime as needed.  2. COPD -- follows with Dr. Lake Bells

## 2013-06-12 NOTE — Telephone Encounter (Signed)
I have not received this paperwork yet.  lmtcb X1 for Mandy at [facility] to follow-up on this.

## 2013-06-15 NOTE — Telephone Encounter (Signed)
Spoke with [place], she said that the assessment was done yesterday.  She will fax the paperwork on the assessment to [place].  Verified the fax number.  Will hold until I receive this assessment paperwork.

## 2013-06-20 NOTE — Telephone Encounter (Signed)
Spoke with Leafy Ro because I have yet to receive this documentation.  She says that she is out of the office today but will have another clinician fax [place] the paperwork on pt's workup with the therapist.  Verified our fax number and pt's dob.  Will keep this message open until I receive that form.

## 2013-06-21 ENCOUNTER — Other Ambulatory Visit: Payer: Self-pay | Admitting: Gastroenterology

## 2013-06-21 DIAGNOSIS — R1310 Dysphagia, unspecified: Secondary | ICD-10-CM

## 2013-06-21 DIAGNOSIS — K219 Gastro-esophageal reflux disease without esophagitis: Secondary | ICD-10-CM

## 2013-06-21 DIAGNOSIS — R933 Abnormal findings on diagnostic imaging of other parts of digestive tract: Secondary | ICD-10-CM

## 2013-06-21 DIAGNOSIS — K222 Esophageal obstruction: Secondary | ICD-10-CM

## 2013-06-21 NOTE — Telephone Encounter (Signed)
This documentation has been received and put in BQ's look-at folder.  Nothing further needed at this time.

## 2013-06-26 ENCOUNTER — Encounter (HOSPITAL_COMMUNITY): Payer: Self-pay | Admitting: Pharmacy Technician

## 2013-06-26 ENCOUNTER — Telehealth: Payer: Self-pay | Admitting: Pulmonary Disease

## 2013-06-26 NOTE — Telephone Encounter (Signed)
Spoke with the pt  She is scheduled for 07/17/13 for Endo with Dr Hilarie Fredrickson  She states that Dr Hilarie Fredrickson is needing pulmonary clearance from Dr Lake Bells for this procedure  Please advise thanks!

## 2013-06-26 NOTE — Telephone Encounter (Signed)
Have her come see me

## 2013-06-27 ENCOUNTER — Ambulatory Visit (INDEPENDENT_AMBULATORY_CARE_PROVIDER_SITE_OTHER): Payer: Medicare Other | Admitting: Pulmonary Disease

## 2013-06-27 ENCOUNTER — Encounter: Payer: Self-pay | Admitting: Pulmonary Disease

## 2013-06-27 VITALS — BP 138/70 | HR 85 | Ht 59.0 in | Wt 144.0 lb

## 2013-06-27 DIAGNOSIS — R0902 Hypoxemia: Secondary | ICD-10-CM

## 2013-06-27 DIAGNOSIS — J449 Chronic obstructive pulmonary disease, unspecified: Secondary | ICD-10-CM

## 2013-06-27 DIAGNOSIS — J9610 Chronic respiratory failure, unspecified whether with hypoxia or hypercapnia: Secondary | ICD-10-CM | POA: Diagnosis not present

## 2013-06-27 DIAGNOSIS — J9611 Chronic respiratory failure with hypoxia: Secondary | ICD-10-CM

## 2013-06-27 DIAGNOSIS — J8410 Pulmonary fibrosis, unspecified: Secondary | ICD-10-CM | POA: Diagnosis not present

## 2013-06-27 MED ORDER — ZOLPIDEM TARTRATE ER 6.25 MG PO TBCR: 6.2500 mg | EXTENDED_RELEASE_TABLET | Freq: Every evening | ORAL | Status: DC | PRN

## 2013-06-27 NOTE — Assessment & Plan Note (Addendum)
Continue 3 L at rest, 4 L at night and with exertion

## 2013-06-27 NOTE — Assessment & Plan Note (Signed)
She has mild COPD which is complicated by interstitial lung disease.  Because of her underlying lung disease she is moderate risk for endoscopy. She last underwent an endoscopy in 2013 and since that time her oxygen requirements have not changed in her pulmonary function tests have only worsened slightly (slight decrease in total lung capacity).  Given all the problems she has had with dysphagia recently I recommended that she proceed with endoscopy.  Plan: -Continue formoterol and Pulmicort -Continue oxygen at dose

## 2013-06-27 NOTE — Progress Notes (Signed)
Subjective:    Patient ID: Kathryn Ruiz, female    DOB: [birth year], 73 y.o.   MRN: [id number]  Synopsis: Kathryn Ruiz was first followed by Dr. Lamonte Sakai in the winter of 2013 the [place] office and then came to the [facility] office in June 2013 for evaluation of COPD and pulmonary fibrosis. She smoked 3 packs a day for 50 years and quit in 2001. CT chest has shown nonspecific fibrotic changes in the bases as well as a significant amount of emphysema throughout her lungs. She has a history of esophageal dysphagia requiring balloon dilation on multiple times in the past. By 2015 her shortness of breath had progressed with a decreased total lung capacity to get the nonspecific fibrotic changes on her CT had not changed. She was started on Ofev in 2015 but was unable to tolerated do to significant nausea and vomiting. She was evaluated by the [facility] lung transplant clinic and was told that she was not a good candidate due to her esophageal dysmotility.  HPI  06/27/2013 routine office visit> [name] is here to see me today because she needs to undergo an endoscopy for esophageal dilatation. She needs to have pulmonary clearance prior to this. She says that her shortness of breath has been about the same since the last visit. Overall her energy level has been quite low compared to her baseline. She continues to work outside in the garden for a significant portion of the day but she does feel increasing fatigue. She has not been coughing more. She continues to use her oxygen on a daily basis she continues to use her nebulized medicines on a daily basis. She is really been having a lot of trouble swallowing.  She had to stop the Ofev because she said that the nausea and vomiting was intolerable. She stopped it for a few weeks and started back on a low dose but unfortunately was not able to tolerate that. She said that she was constantly vomiting while on the medication and was unable to keep any food down.  Past  Medical History  Diagnosis Date  . Fibromyalgia   . GERD (gastroesophageal reflux disease)   . COPD (chronic obstructive pulmonary disease)   . CHF (congestive heart failure)   . Esophageal stricture   . Depression      Review of Systems  Constitutional: Negative for fever, chills and unexpected weight change.  HENT: Negative for congestion, nosebleeds, postnasal drip, rhinorrhea and sneezing.   Respiratory: Positive for shortness of breath. Negative for cough and choking.   Cardiovascular: Negative for chest pain and leg swelling.       Objective:   Physical Exam   Filed Vitals:   06/27/13 1154  BP: 138/70  Pulse: 85  Height: 4\' 11"  (1.499 m)  Weight: 144 lb (65.318 kg)  SpO2: 94%   3 L nasal cannula  Gen: chronically ill appearing, no acute distress HEENT: NCAT, OP clear, nasopharynx clear PULM:  Crackles in bases, good air movement upper lobes, no wheezing  CV: RRR, systolic murmur noted, no JVD AB: BS+, soft, nontender, no hsm Ext: warm, trace edema, no clubbing, no cyanosis  150 pack year smoking history, quit 2001 05/2011 PFT [facility] >> Ratio 61%, FEV1 1.43 L 91% pred TLC 4.05 L 106% pred DLCO 41% pred 06/2011 Overnight oximetry >> 96% of the time her O2 saturation was between 80 and 90% (94% under 88%) 06/2011 CT Chest [facility]>> marked emphysema and basilar fibrosis  08/2011 started on 3L O2  with exertion 08/2011 6MW >> 750 ft., O2 saturation 79% on exercise;  09/2011 [facility] 3 11/2011 Pulm rehab [facility] 01/2012 6MW 1010 ft, O2 sat 90% nadir 3L 05/2012 IgE > normal 05/2012 sputum AFB >> 05/2012 sputum fungal >> c. Albicans 07/2012 CT chest >> bilateral severe emphysema; question of honeycombing and interstitial thickening in the bases, not clearly UIP: [place] read> no significant progression since the 06/2011 study 12/2012 PFT> ratio 65%, FEV1 1.29 L (85% pred), no change with BD, TLC 3.52L (93% pred), DLCO 6.2 (36% pred) 12/2012 ILD serology panel negative\ February 2015 barium  swallow> mild narrowing of the lower cervical esophagus which results in transient obstruction of the 12 mm barium pill, high grade narrowing of the distal esophagus just proximal to a moderate sized partially reducible hiatal hernia. Maximal measured luminal diameter is 4 mm. 12 mm barium pill would not pass. 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 02/2013 Echo> Normal LVEF, mild LVH and diastolic dysfunction; normal RV and RVSP 03/2013 rejected for consideration for lung transplant [facility] (dysphagia)     Assessment & Plan:   Pulmonary fibrosis She was unable to tolerate Ofev.  We will need to talk about starting Esbriet later in the summer.  Plan: -Proceed with endoscopy -Come back in 2 months and we'll discuss starting Esbriet  COPD (chronic obstructive pulmonary disease)  She has mild COPD which is complicated by interstitial lung disease.  Because of her underlying lung disease she is moderate risk for endoscopy. She last underwent an endoscopy in 2013 and since that time her oxygen requirements have not changed in her pulmonary function tests have only worsened slightly (slight decrease in total lung capacity).  Given all the problems she has had with dysphagia recently I recommended that she proceed with endoscopy.  Plan: -Continue formoterol and Pulmicort -Continue oxygen at dose  Chronic hypoxemic respiratory failure Continue 3 L at rest, 4 L at night and with exertion    Updated Medication List Outpatient Encounter Prescriptions as of 06/27/2013  Medication Sig  . budesonide (PULMICORT) 0.5 MG/2ML nebulizer solution Take 2 mLs (0.5 mg total) by nebulization 2 (two) times daily.  . diphenhydrAMINE (BENADRYL) 25 MG tablet Take 50 mg by mouth at bedtime as needed for sleep.  . famotidine (PEPCID) 20 MG tablet Take 1 tablet (20 mg total) by mouth at bedtime.  Marland Kitchen FLUoxetine (PROZAC) 20 MG tablet Take 20 mg by mouth every morning.   . fluticasone (VERAMYST) 27.5 MCG/SPRAY nasal  spray Place 2 sprays into the nose daily.  . formoterol (PERFOROMIST) 20 MCG/2ML nebulizer solution Take 20 mcg by nebulization 2 (two) times daily.  . furosemide (LASIX) 20 MG tablet Take 20 mg by mouth every morning.   . pantoprazole (PROTONIX) 40 MG tablet Take 1 tablet (40 mg total) by mouth daily.  . potassium chloride (KLOR-CON) 20 MEQ packet Take 20 mEq by mouth daily.  . traMADol (ULTRAM) 50 MG tablet Take 50 mg by mouth every 6 (six) hours as needed for moderate pain.   Marland Kitchen zolpidem (AMBIEN CR) 6.25 MG CR tablet Take 1 tablet (6.25 mg total) by mouth at bedtime as needed for sleep.

## 2013-06-27 NOTE — Patient Instructions (Signed)
Take the ambien every other night to help with sleep Stay as active as possible and let me know if you want to go back to pulmonary rehab WE will see you back in 2 months or sooner if needed

## 2013-06-27 NOTE — Telephone Encounter (Signed)
Called spoke with pt. appt scheduled to see BQ today.

## 2013-06-27 NOTE — Assessment & Plan Note (Signed)
She was unable to tolerate Ofev.  We will need to talk about starting Esbriet later in the summer.  Plan: -Proceed with endoscopy -Come back in 2 months and we'll discuss starting Esbriet

## 2013-06-28 ENCOUNTER — Ambulatory Visit: Payer: Medicare Other | Admitting: Pulmonary Disease

## 2013-07-05 ENCOUNTER — Encounter (HOSPITAL_COMMUNITY): Payer: Self-pay | Admitting: *Deleted

## 2013-07-17 ENCOUNTER — Ambulatory Visit (HOSPITAL_COMMUNITY): Payer: Medicare Other | Admitting: Anesthesiology

## 2013-07-17 ENCOUNTER — Encounter (HOSPITAL_COMMUNITY)
Admission: RE | Disposition: A | Discharge status: Home / Self Care | Payer: Self-pay | Source: Ambulatory Visit | Attending: Internal Medicine

## 2013-07-17 ENCOUNTER — Encounter (HOSPITAL_COMMUNITY): Payer: Medicare Other | Admitting: Anesthesiology

## 2013-07-17 ENCOUNTER — Ambulatory Visit (HOSPITAL_COMMUNITY)
Admission: RE | Admit: 2013-07-17 | Discharge: 2013-07-17 | Disposition: A | Discharge status: Home / Self Care | Payer: Medicare Other | Source: Ambulatory Visit | Attending: Internal Medicine | Admitting: Internal Medicine

## 2013-07-17 ENCOUNTER — Encounter (HOSPITAL_COMMUNITY): Payer: Self-pay | Admitting: *Deleted

## 2013-07-17 DIAGNOSIS — Z87891 Personal history of nicotine dependence: Secondary | ICD-10-CM | POA: Diagnosis not present

## 2013-07-17 DIAGNOSIS — K222 Esophageal obstruction: Secondary | ICD-10-CM | POA: Diagnosis not present

## 2013-07-17 DIAGNOSIS — R933 Abnormal findings on diagnostic imaging of other parts of digestive tract: Secondary | ICD-10-CM | POA: Diagnosis not present

## 2013-07-17 DIAGNOSIS — Z9981 Dependence on supplemental oxygen: Secondary | ICD-10-CM | POA: Insufficient documentation

## 2013-07-17 DIAGNOSIS — K449 Diaphragmatic hernia without obstruction or gangrene: Secondary | ICD-10-CM

## 2013-07-17 DIAGNOSIS — J4489 Other specified chronic obstructive pulmonary disease: Secondary | ICD-10-CM | POA: Insufficient documentation

## 2013-07-17 DIAGNOSIS — K228 Other specified diseases of esophagus: Secondary | ICD-10-CM | POA: Diagnosis not present

## 2013-07-17 DIAGNOSIS — I509 Heart failure, unspecified: Secondary | ICD-10-CM | POA: Insufficient documentation

## 2013-07-17 DIAGNOSIS — R1314 Dysphagia, pharyngoesophageal phase: Secondary | ICD-10-CM | POA: Insufficient documentation

## 2013-07-17 DIAGNOSIS — J449 Chronic obstructive pulmonary disease, unspecified: Secondary | ICD-10-CM | POA: Diagnosis not present

## 2013-07-17 DIAGNOSIS — R1310 Dysphagia, unspecified: Secondary | ICD-10-CM | POA: Diagnosis not present

## 2013-07-17 DIAGNOSIS — K2289 Other specified disease of esophagus: Secondary | ICD-10-CM | POA: Diagnosis not present

## 2013-07-17 DIAGNOSIS — R1319 Other dysphagia: Secondary | ICD-10-CM

## 2013-07-17 DIAGNOSIS — IMO0001 Reserved for inherently not codable concepts without codable children: Secondary | ICD-10-CM | POA: Diagnosis not present

## 2013-07-17 DIAGNOSIS — K219 Gastro-esophageal reflux disease without esophagitis: Secondary | ICD-10-CM

## 2013-07-17 HISTORY — PX: ESOPHAGOGASTRODUODENOSCOPY (EGD) WITH PROPOFOL: SHX5813

## 2013-07-17 HISTORY — DX: Dependence on supplemental oxygen: Z99.81

## 2013-07-17 HISTORY — DX: Personal history of other diseases of the digestive system: Z87.19

## 2013-07-17 SURGERY — ESOPHAGOGASTRODUODENOSCOPY (EGD) WITH PROPOFOL
Anesthesia: Monitor Anesthesia Care

## 2013-07-17 MED ORDER — GLYCOPYRROLATE 0.2 MG/ML IJ SOLN
INTRAMUSCULAR | Status: DC | PRN
  Administered 2013-07-17: 0.2 mg via INTRAVENOUS

## 2013-07-17 MED ORDER — ONDANSETRON HCL 4 MG/2ML IJ SOLN
INTRAMUSCULAR | Status: AC
  Filled 2013-07-17: qty 2

## 2013-07-17 MED ORDER — PROPOFOL 10 MG/ML IV BOLUS
INTRAVENOUS | Status: AC
  Filled 2013-07-17: qty 20

## 2013-07-17 MED ORDER — LIDOCAINE HCL (CARDIAC) 20 MG/ML IV SOLN
INTRAVENOUS | Status: AC
  Filled 2013-07-17: qty 5

## 2013-07-17 MED ORDER — KETAMINE HCL 10 MG/ML IJ SOLN
INTRAMUSCULAR | Status: DC | PRN
  Administered 2013-07-17: 20 mg via INTRAVENOUS

## 2013-07-17 MED ORDER — LIDOCAINE HCL (CARDIAC) 20 MG/ML IV SOLN
INTRAVENOUS | Status: DC | PRN
  Administered 2013-07-17: 100 mg via INTRAVENOUS

## 2013-07-17 MED ORDER — MIDAZOLAM HCL 5 MG/5ML IJ SOLN
INTRAMUSCULAR | Status: DC | PRN
  Administered 2013-07-17 (×4): 0.5 mg via INTRAVENOUS

## 2013-07-17 MED ORDER — BUTAMBEN-TETRACAINE-BENZOCAINE 2-2-14 % EX AERO
INHALATION_SPRAY | CUTANEOUS | Status: DC | PRN
  Administered 2013-07-17: 2 via TOPICAL

## 2013-07-17 MED ORDER — LACTATED RINGERS IV SOLN
INTRAVENOUS | Status: DC | PRN
  Administered 2013-07-17: 08:00:00 via INTRAVENOUS

## 2013-07-17 MED ORDER — GLYCOPYRROLATE 0.2 MG/ML IJ SOLN
INTRAMUSCULAR | Status: AC
  Filled 2013-07-17: qty 1

## 2013-07-17 MED ORDER — MIDAZOLAM HCL 2 MG/2ML IJ SOLN
INTRAMUSCULAR | Status: AC
  Filled 2013-07-17: qty 2

## 2013-07-17 MED ORDER — PROPOFOL INFUSION 10 MG/ML OPTIME
INTRAVENOUS | Status: DC | PRN
  Administered 2013-07-17: 75 ug/kg/min via INTRAVENOUS

## 2013-07-17 MED ORDER — SODIUM CHLORIDE 0.9 % IV SOLN: INTRAVENOUS | Status: DC

## 2013-07-17 MED ORDER — ONDANSETRON HCL 4 MG/2ML IJ SOLN
INTRAMUSCULAR | Status: DC | PRN
  Administered 2013-07-17: 4 mg via INTRAVENOUS

## 2013-07-17 SURGICAL SUPPLY — 14 items

## 2013-07-17 NOTE — Anesthesia Preprocedure Evaluation (Addendum)
Anesthesia Evaluation  Patient identified by MRN, date of birth, ID band Patient awake    Reviewed: Allergy & Precautions, H&P , NPO status , Patient's Chart, lab work & pertinent test results  Airway       Dental   Pulmonary COPDformer smoker,          Cardiovascular +CHF     Neuro/Psych negative neurological ROS  negative psych ROS   GI/Hepatic negative GI ROS, Neg liver ROS,   Endo/Other  negative endocrine ROS  Renal/GU negative Renal ROS  negative genitourinary   Musculoskeletal  (+) Fibromyalgia -  Abdominal   Peds negative pediatric ROS (+)  Hematology negative hematology ROS (+)   Anesthesia Other Findings   Reproductive/Obstetrics negative OB ROS                         Anesthesia Physical Anesthesia Plan  ASA: III  Anesthesia Plan:    Post-op Pain Management:    Induction:   Airway Management Planned:   Additional Equipment:   Intra-op Plan:   Post-operative Plan:   Informed Consent:   Plan Discussed with:   Anesthesia Plan Comments:         Anesthesia Quick Evaluation

## 2013-07-17 NOTE — Op Note (Signed)
[place], [id number]   ENDOSCOPY PROCEDURE REPORT  PATIENT: Kathryn Ruiz, Kathryn Ruiz  MR#: [id number] BIRTHDATE: 1940/04/02 , 72  yrs. old GENDER: Female ENDOSCOPIST: Jerene Bears, MD PROCEDURE DATE:  07/17/2013 PROCEDURE:  EGD with balloon dilation ASA CLASS:     Class III INDICATIONS:  Esophageal dysphagia, abnormal barium swallow, history of esophageal stricture. MEDICATIONS: MAC sedation, administered by CRNA and See Anesthesia Report. TOPICAL ANESTHETIC: Cetacaine Spray  DESCRIPTION OF PROCEDURE: After the risks benefits and alternatives of the procedure were thoroughly explained, informed consent was obtained.  The Pentax Gastroscope [id number] endoscope was introduced through the mouth and advanced to the second portion of the duodenum. Without limitations.  The instrument was slowly withdrawn as the mucosa was fully examined.  ESOPHAGUS: A Schatzki ring was found 34 cm from the incisors and required mild resistance to pass the standard upper endoscope.   A 4 cm hiatal hernia was noted (diaphragmatic hiatus at 38 cm from the incisors).  Balloon dilation was performed using TTS balloon to 15 mm. Post dilation mild mucosal tear at ring.  On withdrawal of the endoscope a slight narrowing was encountered in the cervical esophagus without definable stricture.  STOMACH: The mucosa of the stomach appeared normal.  DUODENUM: The duodenal mucosa showed no abnormalities in the bulb and second portion of the duodenum.  Retroflexed views revealed a hiatal hernia.     The scope was then withdrawn from the patient and the procedure completed.  COMPLICATIONS: There were no complications.  ENDOSCOPIC IMPRESSION: 1.   Schatzki ring was found 34 cm from the incisors; dilation performed with TTS balloon to 15 mm 2.   4 cm hiatal hernia 3.   The mucosa of the stomach appeared normal 4.   The duodenal mucosa showed no abnormalities in the bulb  and second portion of the duodenum  RECOMMENDATIONS: 1.  Soft diet the remainder of today 2.  Advance diet tomorrow as tolerated.  Continue to chew foods well and take small bites 3.  Office follow-up next available and repeat dilation when needed   eSigned:  Jerene Bears, MD 07/17/2013 8:35 AM   m   CC:The Patient; Roselie Awkward, Larene Beach, MD

## 2013-07-17 NOTE — H&P (Signed)
HPI: Kathryn Ruiz is a 73 yo female with PMH of esophageal stricture or hiatal hernia, GERD, oxygen-dependent COPD, fibromyalgia who is presenting to the outpatient hospital setting for EGD with probable dilation. She was seen in the office in May 2015 to evaluate esophageal dysphagia. She has a history of abnormal barium swallow showing mild narrowing of the cervical esophagus and more severe narrowing at the GE junction at the proximal edge of the hiatal hernia. She's had previous dilation performed in [place] but this has been several years. She does report poor appetite but feels this may be related to the fact she "knows I can't eat". She is eating soft foods and eating some meats but chewing very very well. She has taken pantoprazole 40 mg and using Pepcid at night. No other GI complaints this morning  Past Medical History  Diagnosis Date  . Fibromyalgia   . GERD (gastroesophageal reflux disease)   . CHF (congestive heart failure)   . Esophageal stricture   . Depression   . H/O hiatal hernia   . Oxygen dependent     07-05-13 oxygen 24/7 -3 l/m daytime, 4 l/m nighttime nasally.  Marland Kitchen COPD (chronic obstructive pulmonary disease)     Dr. Lake Bells -[facility]    Past Surgical History  Procedure Laterality Date  . Sinus surgery  1999  . Appendectomy  1954  . Cataract extraction  1998    both  . Neuroma surgery  2001  . Foot irrigation  2002  . Disectomy  2003  . Vocal cord biopsy  2005  . Shoulder surgery Left 2010  . Varicose vein surgery Bilateral     No current facility-administered medications for this encounter.    No Known Allergies  Family History  Problem Relation Age of Onset  . Heart attack Father 6  . Heart disease Mother     History  Substance Use Topics  . Smoking status: Former Smoker -- 3.00 packs/day for 50 years    Types: Cigarettes    Quit date: 01/26/1999  . Smokeless tobacco: Never Used  . Alcohol Use: No    ROS: As per history of  present illness, otherwise negative  Temp(Src) 98.4 F (36.9 C) (Oral) Gen: awake, alert, NAD HEENT: anicteric, op clear, oxygen via nasal cannula CV: RRR, no mrg Pulm: distant, but clear, no wheezes or rales Abd: soft, NT/ND, +BS throughout Ext: no c/c/e Neuro: nonfocal   RELEVANT LABS AND IMAGING: CBC    Component Value Date/Time   WBC 9.2 06/19/2009 1306   WBC 8.9 05/03/2008 1302   RBC 5.23 06/19/2009 1306   RBC 4.61 05/03/2008 1302   HGB 14.5 06/19/2009 1306   HGB 13.9 05/03/2008 1302   HCT 43.8 06/19/2009 1306   HCT 41.0 05/03/2008 1302   PLT 265 06/19/2009 1306   PLT 295 05/03/2008 1302   MCV 83.6 06/19/2009 1306   MCV 89 05/03/2008 1302   MCH 27.7 06/19/2009 1306   MCH 30.1 05/03/2008 1302   MCHC 33.1 06/19/2009 1306   MCHC 33.8 05/03/2008 1302   RDW 15.1* 06/19/2009 1306   RDW 12.9 05/03/2008 1302   LYMPHSABS 2.8 06/19/2009 1306   LYMPHSABS 2.7 05/03/2008 1302   MONOABS 0.7 06/19/2009 1306   EOSABS 0.2 06/19/2009 1306   EOSABS 0.2 05/03/2008 1302   BASOSABS 0.1 06/19/2009 1306   BASOSABS 0.1 05/03/2008 1302    CMP     Component Value Date/Time   NA 134 06/19/2009 1244   K 3.8 06/19/2009  1244   CL 102 06/19/2009 1244   CO2 19 06/19/2009 1244   GLUCOSE 92 06/19/2009 1244   BUN 17 06/19/2009 1244   CREATININE 0.7 06/19/2009 1244   CALCIUM 9.2 06/19/2009 1244   PROT 7.2 06/19/2009 1244   AST 29 06/19/2009 1244   ALT 23 06/19/2009 1244   ALKPHOS 132* 06/19/2009 1244   BILITOT 0.70 06/19/2009 1244    ASSESSMENT/PLAN:  73 yo female with PMH of esophageal stricture or hiatal hernia, GERD, oxygen-dependent COPD, fibromyalgia who is presenting to the outpatient hospital setting for EGD with probable dilation.  1.  GERD with esophageal dysphagia/stricture -- she has required previous dilation barium swallow suggested narrow stricture at the GE junction. Plan for upper endoscopy today with probable dilation. She has seen Dr. Lake Bells, her pulmonologist who feels given her ongoing issues with swallowing  that upper endoscopy is reasonable. HIGHER THAN BASELINE RISK due to COPD.The nature of the procedure, as well as the risks, benefits, and alternatives were carefully and thoroughly reviewed with the patient. Ample time for discussion and questions allowed. The patient understood, was satisfied, and agreed to proceed.   2.  COPD -- breathing at baseline today on oxygen. Procedure will be performed with monitored anesthesia care

## 2013-07-17 NOTE — Transfer of Care (Signed)
Immediate Anesthesia Transfer of Care Note  Patient: Kathryn Ruiz  Procedure(s) Performed: Procedure(s) (LRB): ESOPHAGOGASTRODUODENOSCOPY (EGD) WITH PROPOFOL (N/A)  Patient Location: PACU  Anesthesia Type: MAC  Level of Consciousness: sedated, patient cooperative and responds to stimulation  Airway & Oxygen Therapy: Patient Spontanous Breathing and Patient connected to face mask oxgen  Post-op Assessment: Report given to PACU RN and Post -op Vital signs reviewed and stable  Post vital signs: Reviewed and stable  Complications: No apparent anesthesia complications

## 2013-07-18 ENCOUNTER — Encounter (HOSPITAL_COMMUNITY): Payer: Self-pay | Admitting: Internal Medicine

## 2013-07-18 NOTE — Anesthesia Postprocedure Evaluation (Signed)
  Anesthesia Post-op Note  Patient: Kathryn Ruiz  Procedure(s) Performed: Procedure(s) (LRB): ESOPHAGOGASTRODUODENOSCOPY (EGD) WITH PROPOFOL (N/A)  Patient Location: PACU  Anesthesia Type: MAC  Level of Consciousness: awake and alert   Airway and Oxygen Therapy: Patient Spontanous Breathing  Post-op Pain: mild  Post-op Assessment: Post-op Vital signs reviewed, Patient's Cardiovascular Status Stable, Respiratory Function Stable, Patent Airway and No signs of Nausea or vomiting  Last Vitals:  Filed Vitals:   07/17/13 0908  BP: 148/56  Pulse: 88  Temp:   Resp: 21    Post-op Vital Signs: stable   Complications: No apparent anesthesia complications

## 2013-09-04 DIAGNOSIS — IMO0001 Reserved for inherently not codable concepts without codable children: Secondary | ICD-10-CM | POA: Diagnosis not present

## 2013-09-04 DIAGNOSIS — I10 Essential (primary) hypertension: Secondary | ICD-10-CM | POA: Diagnosis not present

## 2013-09-04 DIAGNOSIS — J449 Chronic obstructive pulmonary disease, unspecified: Secondary | ICD-10-CM | POA: Diagnosis not present

## 2013-09-04 DIAGNOSIS — J329 Chronic sinusitis, unspecified: Secondary | ICD-10-CM | POA: Diagnosis not present

## 2013-09-27 ENCOUNTER — Ambulatory Visit: Payer: Self-pay | Admitting: Family Medicine

## 2013-09-27 DIAGNOSIS — J449 Chronic obstructive pulmonary disease, unspecified: Secondary | ICD-10-CM | POA: Diagnosis not present

## 2013-09-27 DIAGNOSIS — J40 Bronchitis, not specified as acute or chronic: Secondary | ICD-10-CM | POA: Diagnosis not present

## 2013-09-27 DIAGNOSIS — J4489 Other specified chronic obstructive pulmonary disease: Secondary | ICD-10-CM | POA: Diagnosis not present

## 2013-09-27 DIAGNOSIS — R059 Cough, unspecified: Secondary | ICD-10-CM | POA: Diagnosis not present

## 2013-09-27 DIAGNOSIS — IMO0001 Reserved for inherently not codable concepts without codable children: Secondary | ICD-10-CM | POA: Diagnosis not present

## 2013-09-27 DIAGNOSIS — R0902 Hypoxemia: Secondary | ICD-10-CM | POA: Diagnosis not present

## 2013-09-27 DIAGNOSIS — K219 Gastro-esophageal reflux disease without esophagitis: Secondary | ICD-10-CM | POA: Diagnosis not present

## 2013-10-03 DIAGNOSIS — K140 Glossitis: Secondary | ICD-10-CM | POA: Diagnosis not present

## 2013-10-15 DIAGNOSIS — J449 Chronic obstructive pulmonary disease, unspecified: Secondary | ICD-10-CM | POA: Diagnosis not present

## 2013-10-15 DIAGNOSIS — K219 Gastro-esophageal reflux disease without esophagitis: Secondary | ICD-10-CM | POA: Diagnosis not present

## 2013-10-15 DIAGNOSIS — M26629 Arthralgia of temporomandibular joint, unspecified side: Secondary | ICD-10-CM | POA: Diagnosis not present

## 2013-10-15 DIAGNOSIS — I509 Heart failure, unspecified: Secondary | ICD-10-CM | POA: Diagnosis not present

## 2013-10-15 DIAGNOSIS — I10 Essential (primary) hypertension: Secondary | ICD-10-CM | POA: Diagnosis not present

## 2013-10-15 DIAGNOSIS — K140 Glossitis: Secondary | ICD-10-CM | POA: Diagnosis not present

## 2013-10-15 DIAGNOSIS — IMO0001 Reserved for inherently not codable concepts without codable children: Secondary | ICD-10-CM | POA: Diagnosis not present

## 2013-10-25 DIAGNOSIS — Z23 Encounter for immunization: Secondary | ICD-10-CM | POA: Diagnosis not present

## 2013-12-10 ENCOUNTER — Encounter: Payer: Self-pay | Admitting: Pulmonary Disease

## 2013-12-10 ENCOUNTER — Ambulatory Visit (INDEPENDENT_AMBULATORY_CARE_PROVIDER_SITE_OTHER): Payer: Medicare Other | Admitting: Pulmonary Disease

## 2013-12-10 VITALS — BP 146/74 | HR 94 | Ht 59.0 in | Wt 148.0 lb

## 2013-12-10 DIAGNOSIS — J441 Chronic obstructive pulmonary disease with (acute) exacerbation: Secondary | ICD-10-CM | POA: Diagnosis not present

## 2013-12-10 DIAGNOSIS — J8410 Pulmonary fibrosis, unspecified: Secondary | ICD-10-CM | POA: Diagnosis not present

## 2013-12-10 MED ORDER — DOXYCYCLINE HYCLATE 100 MG PO TABS: 100.0000 mg | ORAL_TABLET | Freq: Two times a day (BID) | ORAL | Status: DC

## 2013-12-10 MED ORDER — HYDROCOD POLST-CHLORPHEN POLST 10-8 MG/5ML PO LQCR: 5.0000 mL | Freq: Every evening | ORAL | Status: DC | PRN

## 2013-12-10 MED ORDER — PREDNISONE 10 MG PO TABS: ORAL_TABLET | ORAL | Status: DC

## 2013-12-10 NOTE — Progress Notes (Signed)
Subjective:    Patient ID: Kathryn Ruiz, female    DOB: 1941/01/21, 73 y.o.   MRN: [id number]  Synopsis: Charda Janis was first followed by Dr. Lamonte Sakai in the winter of 2013 the [place] office and then came to the [facility] office in June 2013 for evaluation of COPD and pulmonary fibrosis. She smoked 3 packs a day for 50 years and quit in 2001. CT chest has shown nonspecific fibrotic changes in the bases as well as a significant amount of emphysema throughout her lungs. She has a history of esophageal dysphagia requiring balloon dilation on multiple times in the past. By 2015 her shortness of breath had progressed with a decreased total lung capacity to get the nonspecific fibrotic changes on her CT had not changed. She was started on Ofev in 2015 but was unable to tolerated do to significant nausea and vomiting. She was evaluated by the [facility] lung transplant clinic and was told that she was not a good candidate due to her esophageal dysmotility.  HPI  12/10/2013 ROV> Sammye Staff says she has been sick since Friday.  She has been coughing a lot, coughing up green mucus.  She feels better when she gets the mucus.  No fevers or chills.  She feels some chest soreness with cough, no frank chest pain.  Her husband has been sick too.  No sinus symptoms.  Before Friday she had been fairly stable.  She has been depressed due to the dyspnea.  She thinks that she feels herself "headed down hill".    Past Medical History  Diagnosis Date  . Fibromyalgia   . GERD (gastroesophageal reflux disease)   . CHF (congestive heart failure)   . Esophageal stricture   . Depression   . H/O hiatal hernia   . Oxygen dependent     07-05-13 oxygen 24/7 -3 l/m daytime, 4 l/m nighttime nasally.  Marland Kitchen COPD (chronic obstructive pulmonary disease)     Dr. Lake Bells -[facility]     Review of Systems  Constitutional: Negative for fever, chills and unexpected weight change.  HENT: Negative for congestion, nosebleeds, postnasal  drip, rhinorrhea and sneezing.   Respiratory: Positive for shortness of breath. Negative for cough and choking.   Cardiovascular: Negative for chest pain and leg swelling.       Objective:   Physical Exam   Filed Vitals:   12/10/13 0859  BP: 146/74  Pulse: 94  Height: 4\' 11"  (1.499 m)  Weight: 148 lb (67.132 kg)  SpO2: 98%   3 L nasal cannula  Gen: chronically ill appearing, no acute distress HEENT: NCAT, OP clear, nasopharynx clear PULM:  Wheezing bilaterally today CV: RRR, systolic murmur noted, no JVD AB: BS+, soft, nontender Ext: warm, trace edema, no clubbing, no cyanosis  150 pack year smoking history, quit 2001 05/2011 PFT [facility] >> Ratio 61%, FEV1 1.43 L 91% pred TLC 4.05 L 106% pred DLCO 41% pred 06/2011 Overnight oximetry >> 96% of the time her O2 saturation was between 80 and 90% (94% under 88%) 06/2011 CT Chest [facility]>> marked emphysema and basilar fibrosis  08/2011 started on 3L O2 with exertion 08/2011 6MW >> 750 ft., O2 saturation 79% on exercise;  09/2011 MMRC 3 11/2011 Pulm rehab [facility] 01/2012 6MW 1010 ft, O2 sat 90% nadir 3L 05/2012 IgE > normal 05/2012 sputum AFB >> 05/2012 sputum fungal >> c. Albicans 07/2012 CT chest >> bilateral severe emphysema; question of honeycombing and interstitial thickening in the bases, not clearly UIP: Yared Barefoot read> no  significant progression since the 06/2011 study 12/2012 PFT> ratio 65%, FEV1 1.29 L (85% pred), no change with BD, TLC 3.52L (93% pred), DLCO 6.2 (36% pred) 12/2012 ILD serology panel negative\ February 2015 barium swallow> mild narrowing of the lower cervical esophagus which results in transient obstruction of the 12 mm barium pill, high grade narrowing of the distal esophagus just proximal to a moderate sized partially reducible hiatal hernia. Maximal measured luminal diameter is 4 mm. 12 mm barium pill would not pass. 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 02/2013 Echo> Normal LVEF, mild LVH and diastolic dysfunction;  normal RV and RVSP 03/2013 rejected for consideration for lung transplant [facility] (dysphagia)     Assessment & Plan:   COPD exacerbation Unfortunately Ms. Weppler is having an exacerbation of her COPD.  Her oxygen level is OK today and she does not need to be hospitalized as her dyspnea is manageable as an outpatient.  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  She did not do well with Ofev and is interested in Pirfenidone, but she would want to start it after the first of the year.  Plan: -repeat PFT and 42min walk in 6 weeks and follow up with me afterwards, may Rx Pirfenidone at that time.    Updated Medication List Outpatient Encounter Prescriptions as of 12/10/2013  Medication Sig  . budesonide (PULMICORT) 0.5 MG/2ML nebulizer solution Take 2 mLs (0.5 mg total) by nebulization 2 (two) times daily.  . diphenhydrAMINE (BENADRYL) 25 MG tablet Take 50 mg by mouth at bedtime as needed for sleep.  . famotidine (PEPCID) 20 MG tablet Take 1 tablet (20 mg total) by mouth at bedtime.  Marland Kitchen FLUoxetine (PROZAC) 20 MG tablet Take 20 mg by mouth every morning.   . fluticasone (VERAMYST) 27.5 MCG/SPRAY nasal spray Place 2 sprays into the nose daily.  . formoterol (PERFOROMIST) 20 MCG/2ML nebulizer solution Take 20 mcg by nebulization 2 (two) times daily.  . furosemide (LASIX) 20 MG tablet Take 20 mg by mouth every morning.   . pantoprazole (PROTONIX) 40 MG tablet Take 1 tablet (40 mg total) by mouth daily.  . potassium chloride (KLOR-CON) 20 MEQ packet Take 20 mEq by mouth daily.  . traMADol (ULTRAM) 50 MG tablet Take 50 mg by mouth every 6 (six) hours as needed for moderate pain.   Marland Kitchen zolpidem (AMBIEN CR) 6.25 MG CR tablet Take 1 tablet (6.25 mg total) by mouth at  bedtime as needed for sleep.

## 2013-12-10 NOTE — Assessment & Plan Note (Signed)
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  She did not do well with Ofev and is interested in Pirfenidone, but she would want to start it after the first of the year.  Plan: -repeat PFT and 72min walk in 6 weeks and follow up with me afterwards, may Rx Pirfenidone at that time.

## 2013-12-10 NOTE — Assessment & Plan Note (Signed)
Unfortunately Kathryn Ruiz is having an exacerbation of her COPD.  Her oxygen level is OK today and she does not need to be hospitalized as her dyspnea is manageable as an outpatient.  Plan: -prednisone taper -doxycycline 7 days -call if no improvement in 7-10 days

## 2013-12-10 NOTE — Patient Instructions (Signed)
Take the Tussionex at night as needed for cough. Don't take this and drive Take the prednisone taper as written Take the doxycycline as written We will set up a 6 min walk and pulmonary function test at [facility] will see you back after that

## 2013-12-14 ENCOUNTER — Telehealth: Payer: Self-pay | Admitting: Pulmonary Disease

## 2013-12-14 NOTE — Telephone Encounter (Signed)
Spoke with Caryl Pina and paperwork has been received and is waiting for BQ to sign.  Will forward to [place] to sign off on once completed.

## 2013-12-14 NOTE — Telephone Encounter (Signed)
Forms have been signed and given to [place].

## 2013-12-14 NOTE — Telephone Encounter (Signed)
I spoke with the daughter and let her know that the form was signed and given to our [facility] to fax  She verbalized understanding  Nothing further needed

## 2013-12-14 NOTE — Telephone Encounter (Signed)
Pt's daughter is requesting an ASAP update.  Kathryn Ruiz

## 2014-01-04 ENCOUNTER — Telehealth: Payer: Self-pay | Admitting: Pulmonary Disease

## 2014-01-04 MED ORDER — PREDNISONE 10 MG PO TABS: ORAL_TABLET | ORAL | Status: DC

## 2014-01-04 MED ORDER — AMOXICILLIN-POT CLAVULANATE 875-125 MG PO TABS: 1.0000 | ORAL_TABLET | Freq: Two times a day (BID) | ORAL | Status: DC

## 2014-01-04 NOTE — Telephone Encounter (Signed)
Pt aware of recs, ok with taking the augmentin instead of the doxy.  Called in to [place].  Nothing further needed.

## 2014-01-04 NOTE — Telephone Encounter (Signed)
Ok on pred taper but I would rec she not take the doxy if didn't turn mucus white and instead do Augmentin 875 mg take one pill twice daily  X 10 days - take at breakfast and supper with large glass of water.  It would help reduce the usual side effects (diarrhea and yeast infections) if you ate cultured yogurt at lunch.   If declines, then ok just to repeat doxy

## 2014-01-04 NOTE — Telephone Encounter (Signed)
Spoke with pt, states she was given doxy and pred taper at last visit, never cleared up fully after finishing these meds.  Pt c/o sinus congestion, pnd, prod cough with yellow mucus, sob with exertion.  Denies fever.  Is requesting a refill on her doxy and pred taper.    Last visit 12/10/13 Next visit recall in chart for Jan. 2016.   Dr. Melvyn Novas please advise.  Thanks!  No Known Allergies Current Outpatient Prescriptions on File Prior to Visit  Medication Sig Dispense Refill  . budesonide (PULMICORT) 0.5 MG/2ML nebulizer solution Take 2 mLs (0.5 mg total) by nebulization 2 (two) times daily. 120 mL 12  . chlorpheniramine-HYDROcodone (TUSSIONEX PENNKINETIC ER) 10-8 MG/5ML LQCR Take 5 mLs by mouth at bedtime as needed for cough. 115 mL 0  . diphenhydrAMINE (BENADRYL) 25 MG tablet Take 50 mg by mouth at bedtime as needed for sleep.    Marland Kitchen doxycycline (VIBRA-TABS) 100 MG tablet Take 1 tablet (100 mg total) by mouth 2 (two) times daily. 14 tablet 0  . famotidine (PEPCID) 20 MG tablet Take 1 tablet (20 mg total) by mouth at bedtime. 90 tablet 3  . FLUoxetine (PROZAC) 20 MG tablet Take 20 mg by mouth every morning.     . fluticasone (VERAMYST) 27.5 MCG/SPRAY nasal spray Place 2 sprays into the nose daily.    . formoterol (PERFOROMIST) 20 MCG/2ML nebulizer solution Take 20 mcg by nebulization 2 (two) times daily.    . furosemide (LASIX) 20 MG tablet Take 20 mg by mouth every morning.     . pantoprazole (PROTONIX) 40 MG tablet Take 1 tablet (40 mg total) by mouth daily. 90 tablet 3  . potassium chloride (KLOR-CON) 20 MEQ packet Take 20 mEq by mouth daily.    . predniSONE (DELTASONE) 10 MG tablet Take 40mg  po daily for 3 days, then take 30mg  po daily for 3 days, then take 20mg  po daily for two days, then take 10mg  po daily for 2 days 27 tablet 0  . traMADol (ULTRAM) 50 MG tablet Take 50 mg by mouth every 6 (six) hours as needed for moderate pain.     Marland Kitchen zolpidem (AMBIEN CR) 6.25 MG CR tablet Take 1 tablet  (6.25 mg total) by mouth at bedtime as needed for sleep. 15 tablet 0   No current facility-administered medications on file prior to visit.

## 2014-01-19 ENCOUNTER — Encounter: Payer: Self-pay | Admitting: Pulmonary Disease

## 2014-01-21 ENCOUNTER — Ambulatory Visit: Payer: Self-pay | Admitting: Pulmonary Disease

## 2014-01-21 DIAGNOSIS — J8410 Pulmonary fibrosis, unspecified: Secondary | ICD-10-CM | POA: Diagnosis not present

## 2014-01-21 LAB — PULMONARY FUNCTION TEST

## 2014-01-30 ENCOUNTER — Encounter: Payer: Self-pay | Admitting: Pulmonary Disease

## 2014-01-30 ENCOUNTER — Ambulatory Visit (INDEPENDENT_AMBULATORY_CARE_PROVIDER_SITE_OTHER): Payer: Medicare Other | Admitting: Pulmonary Disease

## 2014-01-30 VITALS — BP 128/68 | HR 89 | Ht 59.0 in | Wt 150.0 lb

## 2014-01-30 DIAGNOSIS — R29898 Other symptoms and signs involving the musculoskeletal system: Secondary | ICD-10-CM | POA: Diagnosis not present

## 2014-01-30 DIAGNOSIS — J441 Chronic obstructive pulmonary disease with (acute) exacerbation: Secondary | ICD-10-CM | POA: Diagnosis not present

## 2014-01-30 DIAGNOSIS — J8410 Pulmonary fibrosis, unspecified: Secondary | ICD-10-CM | POA: Diagnosis not present

## 2014-01-30 DIAGNOSIS — J9611 Chronic respiratory failure with hypoxia: Secondary | ICD-10-CM

## 2014-01-30 MED ORDER — MORPHINE SULFATE 20 MG/5ML PO SOLN: 2.5000 mg | ORAL | Status: DC | PRN

## 2014-01-30 MED ORDER — PREDNISONE 10 MG PO TABS: ORAL_TABLET | ORAL | Status: DC

## 2014-01-30 NOTE — Assessment & Plan Note (Signed)
Because of her severe muscle deconditioning and severe shortness of breath I'm going to ask for an occupational therapy evaluation as well as a home health evaluation to help get her something such as a bedside commode and whatever else she may need to help cope with activities of daily living.

## 2014-01-30 NOTE — Addendum Note (Signed)
Addended by: Simonne Maffucci B on: 01/30/2014 10:21 PM   Modules accepted: Level of Service

## 2014-01-30 NOTE — Assessment & Plan Note (Signed)
Continue oxygen as written

## 2014-01-30 NOTE — Assessment & Plan Note (Signed)
Sadly this has worsened and is compounding her hypoxemia. She was evaluated for lung transplant and turned down for various reasons including esophageal reflux.  We discussed her treatment options at length including various clinical trials or starting on an anti-fibrotic agent such as Pirfenidone. She did not do well with Ofev because of severe nausea and diarrhea. She understands that Pirfenidone has a similar side effect profile. She also understands that this will not improve her symptoms and will only hopefully slow the progression of the disease.  Plan: -Start Pirfenidone

## 2014-01-30 NOTE — Assessment & Plan Note (Signed)
Kathryn Ruiz continues to have worsening symptoms. This is due to progression in her fibrosis as well as her COPD. As she gets more deconditioned she gets more dyspneic. She has very severe COPD and very severe symptoms.  Today she and her husband and I had a lengthy conversation about how to proceed, see discussion about the pulmonary fibrosis as noted below. I explained to them that I think one of the best possible ways for her to be able to function without significant dyspnea is to start using morphine on an as-needed basis. We frankly discussed progressing to hospice but at this point they're not ready for that. However, they're quite aware that she is dying and has end-stage lung disease. They seem quite realistic with their expectations.  Plan: -Continue inhaled therapies as written with Pulmicort and Perforomist as well as Spiriva -She is wheezing again today indicative of a mild exacerbation so I will treat her with another round of prednisone for 12 days -Start using morphine (2.5 mg liquid every 4 hours as needed for shortness of breath), she and her husband were educated on the risks of this and they're educated to use it carefully while he is around her. She was also educated to watch for constipation and to use stool softeners -Follow-up 6-8 weeks

## 2014-01-30 NOTE — Progress Notes (Signed)
Subjective:    Patient ID: Kathryn Ruiz, female    DOB: Dec 14, 1940, 74 y.o.   MRN: [id number]  Synopsis: Kathryn Ruiz was first followed by Dr. Lamonte Sakai in the winter of 2013 the [place] office and then came to the [facility] office in June 2013 for evaluation of COPD and pulmonary fibrosis. She smoked 3 packs a day for 50 years and quit in 2001. CT chest has shown nonspecific fibrotic changes in the bases as well as a significant amount of emphysema throughout her lungs. She has a history of esophageal dysphagia requiring balloon dilation on multiple times in the past. By 2015 her shortness of breath had progressed with a decreased total lung capacity to get the nonspecific fibrotic changes on her CT had not changed. She was started on Ofev in 2015 but was unable to tolerated do to significant nausea and vomiting. She was evaluated by the [facility] lung transplant clinic and was told that she was not a good candidate due to her esophageal dysmotility.  HPI Chief Complaint  Patient presents with  . Follow-up    pt c/o prod cough with pale yellow mucus, increasing sob with exertion.    [name] has been suffering more lately with severe shortness of breath, she continues to cough. She was treated for a COPD exacerbation about 2 months ago but she says she continues to have cough, wheezing, chest tightness, and mucus production. This has deathly been worse in the last several weeks. Her dyspnea has progressed to the point to where she cannot do any activity without dyspnea. Apparently she was just standing at the counter the other day trying to make a salad and she nearly passed out because she was so short of breath. She cannot walk to the bathroom without severe dyspnea.  Past Medical History  Diagnosis Date  . Fibromyalgia   . GERD (gastroesophageal reflux disease)   . CHF (congestive heart failure)   . Esophageal stricture   . Depression   . H/O hiatal hernia   . Oxygen dependent     07-05-13 oxygen  24/7 -3 l/m daytime, 4 l/m nighttime nasally.  Marland Kitchen COPD (chronic obstructive pulmonary disease)     Dr. Lake Bells -[facility]     Review of Systems  Constitutional: Negative for fever, chills and unexpected weight change.  HENT: Negative for congestion, nosebleeds, postnasal drip, rhinorrhea and sneezing.   Respiratory: Positive for shortness of breath. Negative for cough and choking.   Cardiovascular: Negative for chest pain and leg swelling.       Objective:   Physical Exam  Filed Vitals:   01/30/14 1630  BP: 128/68  Pulse: 89  Height: 4\' 11"  (1.499 m)  Weight: 150 lb (68.04 kg)  SpO2: 95%   3 L nasal cannula  Gen: chronically ill appearing, no acute distress HEENT: NCAT, OP clear, nasopharynx clear PULM:  Wheezing bilaterally with crackles in bases CV: RRR, systolic murmur noted, no JVD AB: BS+, soft, nontender Ext: warm, trace edema, no clubbing, no cyanosis  150 pack year smoking history, quit 2001 05/2011 PFT [facility] >> Ratio 61%, FEV1 1.43 L 91% pred TLC 4.05 L 106% pred DLCO 41% pred 06/2011 Overnight oximetry >> 96% of the time her O2 saturation was between 80 and 90% (94% under 88%) 06/2011 CT Chest [facility]>> marked emphysema and basilar fibrosis  08/2011 started on 3L O2 with exertion 08/2011 6MW >> 750 ft., O2 saturation 79% on exercise;  09/2011 MMRC 3 11/2011 Pulm rehab [facility] 01/2012 6MW  1010 ft, O2 sat 90% nadir 3L 05/2012 IgE > normal 05/2012 sputum AFB >> 05/2012 sputum fungal >> c. Albicans 07/2012 CT chest >> bilateral severe emphysema; question of honeycombing and interstitial thickening in the bases, not clearly UIP: McQuaid read> no significant progression since the 06/2011 study 12/2012 PFT> ratio 65%, FEV1 1.29 L (85% pred), no change with BD, TLC 3.52L (93% pred), DLCO 6.2 (36% pred) 12/2012 ILD serology panel negative\ February 2015 barium swallow> mild narrowing of the lower cervical esophagus which results in transient obstruction of the 12 mm barium pill,  high grade narrowing of the distal esophagus just proximal to a moderate sized partially reducible hiatal hernia. Maximal measured luminal diameter is 4 mm. 12 mm barium pill would not pass. 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 02/2013 Echo> Normal LVEF, mild LVH and diastolic dysfunction; normal RV and RVSP 03/2013 rejected for consideration for lung transplant [facility] (dysphagia)     Assessment & Plan:   COPD (chronic obstructive pulmonary disease) Mechele Claude continues to have worsening symptoms. This is due to progression in her fibrosis as well as her COPD. As she gets more deconditioned she gets more dyspneic. She has very severe COPD and very severe symptoms.  Today she and her husband and I had a lengthy conversation about how to proceed, see discussion about the pulmonary fibrosis as noted below. I explained to them that I think one of the best possible ways for her to be able to function without significant dyspnea is to start using morphine on an as-needed basis. We frankly discussed progressing to hospice but at this point they're not ready for that. However, they're quite aware that she is dying and has end-stage lung disease. They seem quite realistic with their expectations.  Plan: -Continue inhaled therapies as written with Pulmicort and Perforomist as well as Spiriva -She is wheezing again today indicative of a mild exacerbation so I will treat her with another round of prednisone for 12 days -Start using morphine (2.5 mg liquid every 4 hours as needed for shortness of breath), she and her husband were educated on the risks of this and they're educated to use it carefully while he is around her. She was also educated to watch for constipation and to use stool softeners -Follow-up 6-8 weeks  Pulmonary fibrosis Sadly this has worsened and is compounding her hypoxemia. She was evaluated for lung transplant and turned down for various reasons including esophageal reflux.  We discussed  her treatment options at length including various clinical trials or starting on an anti-fibrotic agent such as Pirfenidone. She did not do well with Ofev because of severe nausea and diarrhea. She understands that Pirfenidone has a similar side effect profile. She also understands that this will not improve her symptoms and will only hopefully slow the progression of the disease.  Plan: -Start Pirfenidone  Chronic hypoxemic respiratory failure Continue oxygen as written  Muscular deconditioning Because of her severe muscle deconditioning and severe shortness of breath I'm going to ask for an occupational therapy evaluation as well as a home health evaluation to help get her something such as a bedside commode and whatever else she may need to help cope with activities of daily living.    Updated Medication List Outpatient Encounter Prescriptions as of 01/30/2014  Medication Sig  . budesonide (PULMICORT) 0.5 MG/2ML nebulizer solution Take 2 mLs (0.5 mg total) by nebulization 2 (two) times daily.  . chlorpheniramine-HYDROcodone (TUSSIONEX PENNKINETIC ER) 10-8 MG/5ML LQCR Take 5 mLs by mouth  at bedtime as needed for cough.  Marland Kitchen FLUoxetine (PROZAC) 20 MG tablet Take 20 mg by mouth every morning.   . fluticasone (VERAMYST) 27.5 MCG/SPRAY nasal spray Place 2 sprays into the nose daily.  . formoterol (PERFOROMIST) 20 MCG/2ML nebulizer solution Take 20 mcg by nebulization 2 (two) times daily.  . furosemide (LASIX) 20 MG tablet Take 20 mg by mouth every morning.   . pantoprazole (PROTONIX) 40 MG tablet Take 1 tablet (40 mg total) by mouth daily.  . potassium chloride (KLOR-CON) 20 MEQ packet Take 20 mEq by mouth daily.  Marland Kitchen zolpidem (AMBIEN CR) 6.25 MG CR tablet Take 1 tablet (6.25 mg total) by mouth at bedtime as needed for sleep.  . [DISCONTINUED] famotidine (PEPCID) 20 MG tablet Take 1 tablet (20 mg total) by mouth at bedtime.  . diphenhydrAMINE (BENADRYL) 25 MG tablet Take 50 mg by mouth at bedtime  as needed for sleep.  Marland Kitchen morphine 20 MG/5ML solution Take 0.6 mLs (2.4 mg total) by mouth every 4 (four) hours as needed (shortness of breath).  . predniSONE (DELTASONE) 10 MG tablet Take 40mg  po daily for 3 days, then take 30mg  po daily for 3 days, then take 20mg  po daily for two days, then take 10mg  po daily for 2 days  . [DISCONTINUED] amoxicillin-clavulanate (AUGMENTIN) 875-125 MG per tablet Take 1 tablet by mouth 2 (two) times daily.  . [DISCONTINUED] doxycycline (VIBRA-TABS) 100 MG tablet Take 1 tablet (100 mg total) by mouth 2 (two) times daily.  . [DISCONTINUED] predniSONE (DELTASONE) 10 MG tablet Take 40mg  qd X3 days, then 30mg  qd X3 days, then 20mg  qd X2 days, then 10mg  qd X2 days (Patient not taking: Reported on 01/30/2014)  . [DISCONTINUED] traMADol (ULTRAM) 50 MG tablet Take 50 mg by mouth every 6 (six) hours as needed for moderate pain.

## 2014-01-30 NOTE — Patient Instructions (Signed)
We will arrange an occupational therapy and home health evaluation for severe chronic respiratory failure Take the prednisone taper as written Use the morphine as needed for shortness of breath, take this with a stool softener like colace We will see you back in 4-6 weeks or sooner if needed

## 2014-01-31 ENCOUNTER — Telehealth: Payer: Self-pay | Admitting: Pulmonary Disease

## 2014-01-31 DIAGNOSIS — J9611 Chronic respiratory failure with hypoxia: Secondary | ICD-10-CM

## 2014-01-31 NOTE — Telephone Encounter (Signed)
Called and spoke to pt's husband and was advised nothing further is needed and they are able to receive the morphine through [facility]. Will sign off.

## 2014-01-31 NOTE — Telephone Encounter (Signed)
Spoke with Melissa. I was informed that the current referral for home care will not be sufficient for medicare. Lenna Sciara is requesting a new order-amb ref home health. Within the [facility] referral, OT and nurse assessment will need to be specified.   BQ please advise if ok to continue with new order.

## 2014-01-31 NOTE — Telephone Encounter (Signed)
Melissa returning call.Stanley A Dalton °

## 2014-01-31 NOTE — Telephone Encounter (Signed)
I'm not sure what this means.  I prescribed a 563mL bottle. Does she feel this is too much? If so then have her recommend and alternative size.  Tell her it is for a hospice type situation

## 2014-01-31 NOTE — Telephone Encounter (Signed)
Called and spoke to pharmacist at [facility] and the pharmacist is requesting verification of the recent morphine order from 01/30/14. The order is for over 140 days and pharmacist is hesitant to fill this rx. Pt aware that BQ will not answer this till this afternoon.  BQ please advise.

## 2014-01-31 NOTE — Telephone Encounter (Signed)
Pharmacist will fill this amount but was hesitant because of the large mL amount and wanted to double check the order with BQ. Will let pharmacist know to continue with 582ml as originally ordered.   Will send to BQ as FYI.

## 2014-01-31 NOTE — Telephone Encounter (Signed)
lmtcb for Melissa.  

## 2014-01-31 NOTE — Telephone Encounter (Signed)
OK by me 

## 2014-01-31 NOTE — Telephone Encounter (Signed)
thanks

## 2014-01-31 NOTE — Telephone Encounter (Signed)
lmtcb for pt.  

## 2014-02-01 ENCOUNTER — Telehealth: Payer: Self-pay | Admitting: Pulmonary Disease

## 2014-02-01 NOTE — Telephone Encounter (Signed)
Juliann Pulse called back and she will fax these forms to the triage fax at [phone].  Will give these to ashley/BQ to be completed and turned back in.  Will forward to [facility] to follow up with.

## 2014-02-01 NOTE — Telephone Encounter (Signed)
Order placed for home health services with nursing assessment and OT for severe muscular deconditioning.  Nothing further needed at this time.

## 2014-02-01 NOTE — Telephone Encounter (Signed)
Severe muscular deconditioning

## 2014-02-01 NOTE — Telephone Encounter (Signed)
Juliann Pulse calling back

## 2014-02-01 NOTE — Telephone Encounter (Signed)
I don't have any fmla forms.  Lm in [employer] to see if they have this.

## 2014-02-01 NOTE — Telephone Encounter (Signed)
Calling back

## 2014-02-01 NOTE — Telephone Encounter (Signed)
BQ - OT and nurse assessment has be specific. What you would like to home health nurse to focus on with this pt?

## 2014-02-01 NOTE — Telephone Encounter (Signed)
Spoke with [employer], they do not have any fmla paperwork on this patient.  lmtcb X1 for pt's daughter, cathy to inquire about this.

## 2014-02-01 NOTE — Telephone Encounter (Signed)
lmtcb for [employer]

## 2014-02-01 NOTE — Telephone Encounter (Signed)
Kathryn Ruiz did you receive any FMLA forms from the pts daughter that was faxed over yesterday.  She is needing to have these filled out.  Please advise. These were faxed to the number up front. thanks

## 2014-02-07 DIAGNOSIS — J8410 Pulmonary fibrosis, unspecified: Secondary | ICD-10-CM | POA: Diagnosis not present

## 2014-02-07 DIAGNOSIS — J449 Chronic obstructive pulmonary disease, unspecified: Secondary | ICD-10-CM | POA: Diagnosis not present

## 2014-02-07 DIAGNOSIS — D329 Benign neoplasm of meninges, unspecified: Secondary | ICD-10-CM | POA: Diagnosis not present

## 2014-02-07 DIAGNOSIS — J9611 Chronic respiratory failure with hypoxia: Secondary | ICD-10-CM | POA: Diagnosis not present

## 2014-02-07 DIAGNOSIS — I509 Heart failure, unspecified: Secondary | ICD-10-CM | POA: Diagnosis not present

## 2014-02-07 DIAGNOSIS — Z87891 Personal history of nicotine dependence: Secondary | ICD-10-CM | POA: Diagnosis not present

## 2014-02-07 NOTE — Telephone Encounter (Signed)
Spoke with pt's daughter, states that she has faxed these forms in several times and her mother has received a release of info for her Juliann Pulse) from [employer].  Lm for rachel in [employer] to see if they have these papers yet as I have not seen them.

## 2014-02-07 NOTE — Telephone Encounter (Signed)
Caryl Pina what is the status on this?

## 2014-02-08 DIAGNOSIS — Z87891 Personal history of nicotine dependence: Secondary | ICD-10-CM | POA: Diagnosis not present

## 2014-02-08 DIAGNOSIS — J8410 Pulmonary fibrosis, unspecified: Secondary | ICD-10-CM | POA: Diagnosis not present

## 2014-02-08 DIAGNOSIS — I509 Heart failure, unspecified: Secondary | ICD-10-CM | POA: Diagnosis not present

## 2014-02-08 DIAGNOSIS — J449 Chronic obstructive pulmonary disease, unspecified: Secondary | ICD-10-CM | POA: Diagnosis not present

## 2014-02-08 DIAGNOSIS — J9611 Chronic respiratory failure with hypoxia: Secondary | ICD-10-CM | POA: Diagnosis not present

## 2014-02-08 DIAGNOSIS — D329 Benign neoplasm of meninges, unspecified: Secondary | ICD-10-CM | POA: Diagnosis not present

## 2014-02-08 NOTE — Telephone Encounter (Signed)
I have received FMLA forms for Kathryn Ruiz, will send down to [employer].

## 2014-02-11 ENCOUNTER — Telehealth: Payer: Self-pay | Admitting: Pulmonary Disease

## 2014-02-11 DIAGNOSIS — J449 Chronic obstructive pulmonary disease, unspecified: Secondary | ICD-10-CM | POA: Diagnosis not present

## 2014-02-11 DIAGNOSIS — I509 Heart failure, unspecified: Secondary | ICD-10-CM | POA: Diagnosis not present

## 2014-02-11 DIAGNOSIS — D329 Benign neoplasm of meninges, unspecified: Secondary | ICD-10-CM | POA: Diagnosis not present

## 2014-02-11 DIAGNOSIS — J8410 Pulmonary fibrosis, unspecified: Secondary | ICD-10-CM | POA: Diagnosis not present

## 2014-02-11 DIAGNOSIS — J9611 Chronic respiratory failure with hypoxia: Secondary | ICD-10-CM | POA: Diagnosis not present

## 2014-02-11 DIAGNOSIS — Z87891 Personal history of nicotine dependence: Secondary | ICD-10-CM | POA: Diagnosis not present

## 2014-02-11 NOTE — Telephone Encounter (Signed)
Kathryn Ruiz is out of the office until Tuesday 02-12-14. Will need to update her on below and make sure she understands to reach out to [facility] for further information/instructions on FMLA paperwork.

## 2014-02-11 NOTE — Telephone Encounter (Signed)
Spoke with Sherri, wants to know if BQ is ok with ordering the [employer] Mini Concentrator through [facility] for this pt.    Dr. Lake Bells are you ok with this order?  Thanks!

## 2014-02-11 NOTE — Telephone Encounter (Signed)
Spoke with Sherri-it appears the current tank patient has is too cumbersome for her and wants something more light weight, smaller. Please advise. Thanks.

## 2014-02-11 NOTE — Telephone Encounter (Signed)
I'm OK with that 

## 2014-02-11 NOTE — Telephone Encounter (Signed)
Why are we changing?

## 2014-02-12 NOTE — Telephone Encounter (Signed)
Order has been placed.

## 2014-02-13 DIAGNOSIS — D329 Benign neoplasm of meninges, unspecified: Secondary | ICD-10-CM | POA: Diagnosis not present

## 2014-02-13 DIAGNOSIS — Z87891 Personal history of nicotine dependence: Secondary | ICD-10-CM | POA: Diagnosis not present

## 2014-02-13 DIAGNOSIS — J8410 Pulmonary fibrosis, unspecified: Secondary | ICD-10-CM | POA: Diagnosis not present

## 2014-02-13 DIAGNOSIS — I509 Heart failure, unspecified: Secondary | ICD-10-CM | POA: Diagnosis not present

## 2014-02-13 DIAGNOSIS — J9611 Chronic respiratory failure with hypoxia: Secondary | ICD-10-CM | POA: Diagnosis not present

## 2014-02-13 DIAGNOSIS — J449 Chronic obstructive pulmonary disease, unspecified: Secondary | ICD-10-CM | POA: Diagnosis not present

## 2014-02-14 NOTE — Telephone Encounter (Signed)
Spoke with [name] in [place] and they have had the FMLA papers since 02/01/14 and they only keep these for 15 business days.  They have sent her a release and have not received it back along with a check.  I spoke with Juliann Pulse, pt's daughter and she is going to send the release and check in the mail today.

## 2014-02-18 DIAGNOSIS — I509 Heart failure, unspecified: Secondary | ICD-10-CM | POA: Diagnosis not present

## 2014-02-18 DIAGNOSIS — J449 Chronic obstructive pulmonary disease, unspecified: Secondary | ICD-10-CM | POA: Diagnosis not present

## 2014-02-18 DIAGNOSIS — D329 Benign neoplasm of meninges, unspecified: Secondary | ICD-10-CM | POA: Diagnosis not present

## 2014-02-18 DIAGNOSIS — J8410 Pulmonary fibrosis, unspecified: Secondary | ICD-10-CM | POA: Diagnosis not present

## 2014-02-18 DIAGNOSIS — Z87891 Personal history of nicotine dependence: Secondary | ICD-10-CM | POA: Diagnosis not present

## 2014-02-18 DIAGNOSIS — J9611 Chronic respiratory failure with hypoxia: Secondary | ICD-10-CM | POA: Diagnosis not present

## 2014-02-19 DIAGNOSIS — J449 Chronic obstructive pulmonary disease, unspecified: Secondary | ICD-10-CM | POA: Diagnosis not present

## 2014-02-19 DIAGNOSIS — D329 Benign neoplasm of meninges, unspecified: Secondary | ICD-10-CM | POA: Diagnosis not present

## 2014-02-19 DIAGNOSIS — J8410 Pulmonary fibrosis, unspecified: Secondary | ICD-10-CM | POA: Diagnosis not present

## 2014-02-19 DIAGNOSIS — Z87891 Personal history of nicotine dependence: Secondary | ICD-10-CM | POA: Diagnosis not present

## 2014-02-19 DIAGNOSIS — J9611 Chronic respiratory failure with hypoxia: Secondary | ICD-10-CM | POA: Diagnosis not present

## 2014-02-19 DIAGNOSIS — I509 Heart failure, unspecified: Secondary | ICD-10-CM | POA: Diagnosis not present

## 2014-02-21 ENCOUNTER — Telehealth: Payer: Self-pay | Admitting: Pulmonary Disease

## 2014-02-21 DIAGNOSIS — D329 Benign neoplasm of meninges, unspecified: Secondary | ICD-10-CM | POA: Diagnosis not present

## 2014-02-21 DIAGNOSIS — J8410 Pulmonary fibrosis, unspecified: Secondary | ICD-10-CM | POA: Diagnosis not present

## 2014-02-21 DIAGNOSIS — I509 Heart failure, unspecified: Secondary | ICD-10-CM | POA: Diagnosis not present

## 2014-02-21 DIAGNOSIS — Z87891 Personal history of nicotine dependence: Secondary | ICD-10-CM | POA: Diagnosis not present

## 2014-02-21 DIAGNOSIS — J9611 Chronic respiratory failure with hypoxia: Secondary | ICD-10-CM | POA: Diagnosis not present

## 2014-02-21 DIAGNOSIS — J449 Chronic obstructive pulmonary disease, unspecified: Secondary | ICD-10-CM | POA: Diagnosis not present

## 2014-02-21 NOTE — Telephone Encounter (Signed)
Spoke with Olivia Mackie w/[facility], states that that patient has been having a low grade fever for almost 2 weeks - ranging 99.5-99.8 - reports some wheezing and SOB which is unchanged. Pt's appetite is also very poor. Pt has upcoming appt with BQ 02/28/14. Aware that I will get this over to Dr Lake Bells to make him aware.

## 2014-02-22 NOTE — Telephone Encounter (Signed)
Called and spoke with [facility] - advised to keep upcoming appt and if symptoms worsen to seek care at ED over the weekend of contact our office for sooner appt. Expressed understanding. Nothing further needed.

## 2014-02-22 NOTE — Telephone Encounter (Signed)
noted 

## 2014-02-26 ENCOUNTER — Telehealth: Payer: Self-pay | Admitting: Pulmonary Disease

## 2014-02-26 DIAGNOSIS — J8410 Pulmonary fibrosis, unspecified: Secondary | ICD-10-CM | POA: Diagnosis not present

## 2014-02-26 DIAGNOSIS — J9611 Chronic respiratory failure with hypoxia: Secondary | ICD-10-CM | POA: Diagnosis not present

## 2014-02-26 DIAGNOSIS — J449 Chronic obstructive pulmonary disease, unspecified: Secondary | ICD-10-CM | POA: Diagnosis not present

## 2014-02-26 DIAGNOSIS — Z87891 Personal history of nicotine dependence: Secondary | ICD-10-CM | POA: Diagnosis not present

## 2014-02-26 DIAGNOSIS — I509 Heart failure, unspecified: Secondary | ICD-10-CM | POA: Diagnosis not present

## 2014-02-26 DIAGNOSIS — D329 Benign neoplasm of meninges, unspecified: Secondary | ICD-10-CM | POA: Diagnosis not present

## 2014-02-26 NOTE — Telephone Encounter (Signed)
I do not.  Dr. Lake Bells do you have these forms from [employer]?

## 2014-02-26 NOTE — Telephone Encounter (Signed)
Kathryn Pina do you have these forms? thanks

## 2014-02-27 NOTE — Telephone Encounter (Signed)
Given to [place] today

## 2014-02-27 NOTE — Telephone Encounter (Signed)
Daughter calling back to check on  fmla forms needs a call asap please call her @ [phone].Hillery Hunter

## 2014-02-27 NOTE — Telephone Encounter (Signed)
lmtcb X1 for pt.  I have these forms signed from BQ.

## 2014-02-27 NOTE — Telephone Encounter (Signed)
These forms have been faxed back to [employer].  Pt's daughter aware.  Nothing further needed at this time.

## 2014-02-28 ENCOUNTER — Ambulatory Visit (INDEPENDENT_AMBULATORY_CARE_PROVIDER_SITE_OTHER): Payer: Medicare Other | Admitting: Pulmonary Disease

## 2014-02-28 ENCOUNTER — Encounter: Payer: Self-pay | Admitting: Pulmonary Disease

## 2014-02-28 VITALS — BP 122/76 | HR 94 | Ht 59.0 in | Wt 149.0 lb

## 2014-02-28 DIAGNOSIS — J8410 Pulmonary fibrosis, unspecified: Secondary | ICD-10-CM | POA: Diagnosis not present

## 2014-02-28 DIAGNOSIS — J42 Unspecified chronic bronchitis: Secondary | ICD-10-CM | POA: Diagnosis not present

## 2014-02-28 DIAGNOSIS — J9611 Chronic respiratory failure with hypoxia: Secondary | ICD-10-CM | POA: Diagnosis not present

## 2014-02-28 MED ORDER — GUAIFENESIN-CODEINE 100-10 MG/5ML PO SYRP: 5.0000 mL | ORAL_SOLUTION | Freq: Three times a day (TID) | ORAL | Status: DC | PRN

## 2014-02-28 MED ORDER — PREDNISONE 10 MG PO TABS: ORAL_TABLET | ORAL | Status: DC

## 2014-02-28 NOTE — Assessment & Plan Note (Signed)
Kathryn Ruiz has severe, end-stage COPD and her symptoms have worsened recently. We will try to treat the concomitant pulmonary fibrosis as detailed below to control her symptoms.   Plan:  -continue inhaled therapies  -continue oxygen

## 2014-02-28 NOTE — Patient Instructions (Signed)
Take the prednisone as written Try using the new cough syrup as needed for cough Take the Esbriet as directed We will see you back in 4-6 weeks or sooner if needed

## 2014-02-28 NOTE — Assessment & Plan Note (Signed)
Continue using oxygen at 4 L continuously

## 2014-02-28 NOTE — Assessment & Plan Note (Signed)
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 She failed treatment with Ofev last year. She is willing to try Esbriet. Today we spoke about the risks and benefits of this and the need for monitoring liver function test, watching out for diarrhea, and being careful with sun exposure.  Plan:  -Start Esbriet

## 2014-02-28 NOTE — Progress Notes (Signed)
Subjective:    Patient ID: Kathryn Ruiz, female    DOB: [birth year], 74 y.o.   MRN: [id number]  Synopsis: Kathryn Ruiz was first followed by Dr. Lamonte Sakai in the winter of 2013 the [place] office and then came to the [facility] office in June 2013 for evaluation of COPD and pulmonary fibrosis. She smoked 3 packs a day for 50 years and quit in 2001. CT chest has shown nonspecific fibrotic changes in the bases as well as a significant amount of emphysema throughout her lungs. She has a history of esophageal dysphagia requiring balloon dilation on multiple times in the past. By 2015 her shortness of breath had progressed with a decreased total lung capacity to get the nonspecific fibrotic changes on her CT had not changed. She was started on Ofev in 2015 but was unable to tolerated do to significant nausea and vomiting. She was evaluated by the [facility] lung transplant clinic and was told that she was not a good candidate due to her esophageal dysmotility.  HPI Chief Complaint  Patient presents with  . Follow-up    pt c/o sob with exhertion, chest tightness, cough with green mucus.   [name] says that she has been having pain in her leg which felt like someone kicking her in her foot.  She now has pai in her right buttock and lower back.  This has been a big problem for her lately.  She thinks that it has been harder for her to breathe. She continues to cough up mucus, slightly more green than normal.  More mucus than normal.  No fever or chills.  No chest pain.  Except apparently she had a fever a week or so ago. She notes some night sweats at night which can be soaking.    Past Medical History  Diagnosis Date  . Fibromyalgia   . GERD (gastroesophageal reflux disease)   . CHF (congestive heart failure)   . Esophageal stricture   . Depression   . H/O hiatal hernia   . Oxygen dependent     07-05-13 oxygen 24/7 -3 l/m daytime, 4 l/m nighttime nasally.  Marland Kitchen COPD (chronic obstructive pulmonary disease)     Dr.  Lake Bells -[facility]     Review of Systems  Constitutional: Negative for fever, chills and unexpected weight change.  HENT: Negative for congestion, nosebleeds, postnasal drip, rhinorrhea and sneezing.   Respiratory: Positive for shortness of breath. Negative for cough and choking.   Cardiovascular: Negative for chest pain and leg swelling.       Objective:   Physical Exam  Filed Vitals:   02/28/14 1345  BP: 122/76  Pulse: 94  Height: 4\' 11"  (1.499 m)  Weight: 149 lb (67.586 kg)  SpO2: 90%   3 L nasal cannula  Gen: chronically ill appearing, no acute distress HEENT: NCAT, OP clear, nasopharynx clear PULM:  Wheezing bilaterally with crackles in bases CV: RRR, systolic murmur noted, no JVD AB: BS+, soft, nontender Ext: warm, trace edema, no clubbing, no cyanosis  150 pack year smoking history, quit 2001 05/2011 PFT [facility] >> Ratio 61%, FEV1 1.43 L 91% pred TLC 4.05 L 106% pred DLCO 41% pred 06/2011 Overnight oximetry >> 96% of the time her O2 saturation was between 80 and 90% (94% under 88%) 06/2011 CT Chest [facility]>> marked emphysema and basilar fibrosis  08/2011 started on 3L O2 with exertion 08/2011 6MW >> 750 ft., O2 saturation 79% on exercise;  09/2011 MMRC 3 11/2011 Pulm rehab [facility] 01/2012 6MW 1010  ft, O2 sat 90% nadir 3L 05/2012 IgE > normal 05/2012 sputum AFB >> 05/2012 sputum fungal >> c. Albicans 07/2012 CT chest >> bilateral severe emphysema; question of honeycombing and interstitial thickening in the bases, not clearly UIP: McQuaid read> no significant progression since the 06/2011 study 12/2012 PFT> ratio 65%, FEV1 1.29 L (85% pred), no change with BD, TLC 3.52L (93% pred), DLCO 6.2 (36% pred) 12/2012 ILD serology panel negative\ February 2015 barium swallow> mild narrowing of the lower cervical esophagus which results in transient obstruction of the 12 mm barium pill, high grade narrowing of the distal esophagus just proximal to a moderate sized partially reducible  hiatal hernia. Maximal measured luminal diameter is 4 mm. 12 mm barium pill would not pass. 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 02/2013 Echo> Normal LVEF, mild LVH and diastolic dysfunction; normal RV and RVSP 03/2013 rejected for consideration for lung transplant [facility] (dysphagia)     Assessment & Plan:   COPD (chronic obstructive pulmonary disease) Kathryn Ruiz has severe, end-stage COPD and her symptoms have worsened recently. We will try to treat the concomitant pulmonary fibrosis as detailed below to control her symptoms.   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 She failed treatment with Ofev last year. She is willing to try Esbriet. Today we spoke about the risks and benefits of this and the need for monitoring liver function test, watching out for diarrhea, and being careful with sun exposure.  Plan:  -Start Esbriet      Updated Medication List Outpatient Encounter Prescriptions as of 02/28/2014  Medication Sig  . diphenhydrAMINE (BENADRYL) 25 MG tablet Take 50 mg by mouth at bedtime as needed for sleep.  Marland Kitchen FLUoxetine (PROZAC) 20 MG tablet Take 20 mg by mouth every morning.   . fluticasone (VERAMYST) 27.5 MCG/SPRAY nasal spray Place 2 sprays into the nose daily.  . formoterol (PERFOROMIST) 20 MCG/2ML nebulizer solution Take 20 mcg by nebulization 2 (two) times daily.  . furosemide (LASIX) 20 MG tablet Take 20 mg by mouth every morning.   Marland Kitchen morphine 20 MG/5ML solution Take 0.6 mLs (2.4 mg total) by mouth every 4 (four) hours as needed (shortness of breath).  . pantoprazole (PROTONIX) 40 MG tablet Take 1 tablet (40 mg total) by mouth daily.  . potassium chloride (KLOR-CON) 20 MEQ packet Take 20 mEq by mouth daily.  Marland Kitchen zolpidem (AMBIEN CR) 6.25 MG CR tablet  Take 1 tablet (6.25 mg total) by mouth at bedtime as needed for sleep.  . [DISCONTINUED] chlorpheniramine-HYDROcodone (TUSSIONEX PENNKINETIC ER) 10-8 MG/5ML LQCR Take 5 mLs by mouth at bedtime as needed for cough.  . budesonide (PULMICORT) 0.5 MG/2ML nebulizer solution Take 2 mLs (0.5 mg total) by nebulization 2 (two) times daily. (Patient not taking: Reported on 02/28/2014)  . guaiFENesin-codeine (CHERATUSSIN AC) 100-10 MG/5ML syrup Take 5 mLs by mouth 3 (three) times daily as needed for cough.  . predniSONE (DELTASONE) 10 MG tablet Take 30mg  daily for three days, then 20mg  daily for three days  . [DISCONTINUED] guaiFENesin-codeine (CHERATUSSIN AC) 100-10 MG/5ML syrup Take 5 mLs by mouth 3 (three) times daily as needed for cough.  . [DISCONTINUED] predniSONE (DELTASONE) 10 MG tablet Take 40mg  po daily for 3 days, then take 30mg  po daily for 3 days, then take 20mg  po daily for  two days, then take 10mg  po daily for 2 days (Patient not taking: Reported on 02/28/2014)

## 2014-03-01 ENCOUNTER — Other Ambulatory Visit: Payer: Self-pay | Admitting: Pulmonary Disease

## 2014-03-01 DIAGNOSIS — J42 Unspecified chronic bronchitis: Secondary | ICD-10-CM | POA: Diagnosis not present

## 2014-03-01 DIAGNOSIS — J9611 Chronic respiratory failure with hypoxia: Secondary | ICD-10-CM | POA: Diagnosis not present

## 2014-03-01 DIAGNOSIS — J8410 Pulmonary fibrosis, unspecified: Secondary | ICD-10-CM | POA: Diagnosis not present

## 2014-03-01 DIAGNOSIS — D329 Benign neoplasm of meninges, unspecified: Secondary | ICD-10-CM | POA: Diagnosis not present

## 2014-03-01 DIAGNOSIS — J449 Chronic obstructive pulmonary disease, unspecified: Secondary | ICD-10-CM | POA: Diagnosis not present

## 2014-03-01 DIAGNOSIS — I509 Heart failure, unspecified: Secondary | ICD-10-CM | POA: Diagnosis not present

## 2014-03-01 DIAGNOSIS — Z87891 Personal history of nicotine dependence: Secondary | ICD-10-CM | POA: Diagnosis not present

## 2014-03-02 DIAGNOSIS — J42 Unspecified chronic bronchitis: Secondary | ICD-10-CM | POA: Diagnosis not present

## 2014-03-03 ENCOUNTER — Other Ambulatory Visit: Payer: Self-pay | Admitting: Pulmonary Disease

## 2014-03-03 DIAGNOSIS — J42 Unspecified chronic bronchitis: Secondary | ICD-10-CM | POA: Diagnosis not present

## 2014-03-06 ENCOUNTER — Other Ambulatory Visit: Payer: Self-pay | Admitting: *Deleted

## 2014-03-06 DIAGNOSIS — J42 Unspecified chronic bronchitis: Secondary | ICD-10-CM

## 2014-03-07 DIAGNOSIS — I509 Heart failure, unspecified: Secondary | ICD-10-CM | POA: Diagnosis not present

## 2014-03-07 DIAGNOSIS — J449 Chronic obstructive pulmonary disease, unspecified: Secondary | ICD-10-CM | POA: Diagnosis not present

## 2014-03-07 DIAGNOSIS — Z87891 Personal history of nicotine dependence: Secondary | ICD-10-CM | POA: Diagnosis not present

## 2014-03-07 DIAGNOSIS — J9611 Chronic respiratory failure with hypoxia: Secondary | ICD-10-CM | POA: Diagnosis not present

## 2014-03-07 DIAGNOSIS — J8410 Pulmonary fibrosis, unspecified: Secondary | ICD-10-CM | POA: Diagnosis not present

## 2014-03-07 DIAGNOSIS — D329 Benign neoplasm of meninges, unspecified: Secondary | ICD-10-CM | POA: Diagnosis not present

## 2014-03-12 ENCOUNTER — Telehealth: Payer: Self-pay

## 2014-03-12 NOTE — Telephone Encounter (Signed)
Spoke with Lattie Haw at [employer]) to verbally do a PA for pt's Esbriet.  A determination should be made within 72 hours.  PA case # for this is [id number].  Will hold for determination.

## 2014-03-13 NOTE — Telephone Encounter (Signed)
Spoke with Corene Cornea at [employer] to answer questions to expedite PA for esbriet.  We should have an outcome within 48 hours.  Will hold until decision is made.

## 2014-03-13 NOTE — Telephone Encounter (Signed)
LMTCB x 1 for PA rep

## 2014-03-13 NOTE — Telephone Encounter (Signed)
[employer] called back in regards to PA for esbriet. She will be faxing questions for [place] to answer to get this approved.  We can give the answers over the phone at [phone]. reference # [id number]

## 2014-03-14 ENCOUNTER — Telehealth: Payer: Self-pay | Admitting: Pulmonary Disease

## 2014-03-14 NOTE — Telephone Encounter (Signed)
See phone note 03/12/14

## 2014-03-14 NOTE — Telephone Encounter (Signed)
Called spoke with pt. She reports she received a call around 2 pm stating her esbreit PA was denied. I advised her that nurse with someone yesterday at 4:54PM and was advised will receive an answer in 48 hrs. Will forward to [place] to see if approval/denial received

## 2014-03-15 NOTE — Telephone Encounter (Signed)
Esbriet PA has been denied because pt has not had LFT's drawn prior to start of med.  Spoke with BQ about this, will have lft's drawn in BT and will appeal this decision.  Appeal forms have been filled out and faxed appropriately.  Will hold in my inbasket until a decision is received.

## 2014-03-18 ENCOUNTER — Telehealth: Payer: Self-pay | Admitting: Pulmonary Disease

## 2014-03-18 DIAGNOSIS — J9611 Chronic respiratory failure with hypoxia: Secondary | ICD-10-CM | POA: Diagnosis not present

## 2014-03-18 DIAGNOSIS — D329 Benign neoplasm of meninges, unspecified: Secondary | ICD-10-CM | POA: Diagnosis not present

## 2014-03-18 DIAGNOSIS — J449 Chronic obstructive pulmonary disease, unspecified: Secondary | ICD-10-CM | POA: Diagnosis not present

## 2014-03-18 DIAGNOSIS — Z87891 Personal history of nicotine dependence: Secondary | ICD-10-CM | POA: Diagnosis not present

## 2014-03-18 DIAGNOSIS — I509 Heart failure, unspecified: Secondary | ICD-10-CM | POA: Diagnosis not present

## 2014-03-18 DIAGNOSIS — J8410 Pulmonary fibrosis, unspecified: Secondary | ICD-10-CM | POA: Diagnosis not present

## 2014-03-18 NOTE — Telephone Encounter (Signed)
Spoke with Sherri, pt has been running high blood pressures recently, and temperature has been running 99.8.  States that pt is feeling well, but just wanted to make BQ aware.  Also, pt's spouse wanted to speak to [place] about something.  Spoke with pt, she was wanting to know about the status of her esbriet form.  I advised her that insurance had denied this medication and we were working on an appeal.  Nothing further needed.   Forwarding to BQ just as fyi.

## 2014-03-19 NOTE — Telephone Encounter (Signed)
thanks

## 2014-03-22 NOTE — Telephone Encounter (Signed)
[facility] [employer] called requesting we call them when appeal decision has been made [phone]

## 2014-03-25 ENCOUNTER — Telehealth: Payer: Self-pay | Admitting: Pulmonary Disease

## 2014-03-25 NOTE — Telephone Encounter (Signed)
Pt got a letter stating this has been approved how does she get the meds [phone]

## 2014-03-25 NOTE — Telephone Encounter (Signed)
Thanks

## 2014-03-25 NOTE — Telephone Encounter (Signed)
Spoke with Apolonio Schneiders at [employer], she is aware that med has been approved.    Called pt, advised her that the specialty pharmacy will be contacting her to verify her address and the medication will be shipped right to her house.  Nothing further needed.  Forwarding to BQ to let him know that esbriet has been approved.

## 2014-03-25 NOTE — Telephone Encounter (Signed)
Spoke with [place] with [employer], states that he isn't sure why Cloyde Reams was calling our office back.   I explained to him that pt's esbriet was denied, we appealed, we have not received a response but pt received a letter today stating esbriet is approved through 03-24-2015.   Diego will be contacting the specialty pharmacy to see what the problems is.

## 2014-03-27 NOTE — Telephone Encounter (Addendum)
Called and spoke to [employer] and was advised pt's Esbriet has been approved but the account is still pending because the pharmacy has yet to reach the pt. Once the pt has been contacted they will discuss shipment dates and payment options. Called and spoke to pt to inform her [facility] [employer] will be contacting her. Pt verbalized understanding and denied any further questions or concerns at this time.   Case ID#: [id number]

## 2014-04-03 ENCOUNTER — Telehealth: Payer: Self-pay | Admitting: Pulmonary Disease

## 2014-04-03 DIAGNOSIS — J449 Chronic obstructive pulmonary disease, unspecified: Secondary | ICD-10-CM | POA: Diagnosis not present

## 2014-04-03 DIAGNOSIS — I509 Heart failure, unspecified: Secondary | ICD-10-CM | POA: Diagnosis not present

## 2014-04-03 DIAGNOSIS — J8410 Pulmonary fibrosis, unspecified: Secondary | ICD-10-CM | POA: Diagnosis not present

## 2014-04-03 DIAGNOSIS — J9611 Chronic respiratory failure with hypoxia: Secondary | ICD-10-CM | POA: Diagnosis not present

## 2014-04-03 DIAGNOSIS — Z87891 Personal history of nicotine dependence: Secondary | ICD-10-CM | POA: Diagnosis not present

## 2014-04-03 DIAGNOSIS — D329 Benign neoplasm of meninges, unspecified: Secondary | ICD-10-CM | POA: Diagnosis not present

## 2014-04-03 NOTE — Telephone Encounter (Signed)
Verbal order has been given to Sherri to see patient every 2 weeks. Nothing further was needed.

## 2014-04-08 ENCOUNTER — Telehealth: Payer: Self-pay | Admitting: *Deleted

## 2014-04-08 DIAGNOSIS — J449 Chronic obstructive pulmonary disease, unspecified: Secondary | ICD-10-CM | POA: Diagnosis not present

## 2014-04-08 DIAGNOSIS — I509 Heart failure, unspecified: Secondary | ICD-10-CM | POA: Diagnosis not present

## 2014-04-08 DIAGNOSIS — Z87891 Personal history of nicotine dependence: Secondary | ICD-10-CM | POA: Diagnosis not present

## 2014-04-08 DIAGNOSIS — J9611 Chronic respiratory failure with hypoxia: Secondary | ICD-10-CM | POA: Diagnosis not present

## 2014-04-08 DIAGNOSIS — J8410 Pulmonary fibrosis, unspecified: Secondary | ICD-10-CM | POA: Diagnosis not present

## 2014-04-08 DIAGNOSIS — D329 Benign neoplasm of meninges, unspecified: Secondary | ICD-10-CM | POA: Diagnosis not present

## 2014-04-08 NOTE — Telephone Encounter (Signed)
Received refill request for Cheratussin AC.  Last refilled 02/28/14 #120 ml Last OV 02/28/14 Pending appt 04/16/14 Please advise BQ thanks

## 2014-04-09 DIAGNOSIS — J449 Chronic obstructive pulmonary disease, unspecified: Secondary | ICD-10-CM | POA: Diagnosis not present

## 2014-04-09 DIAGNOSIS — I509 Heart failure, unspecified: Secondary | ICD-10-CM | POA: Diagnosis not present

## 2014-04-09 DIAGNOSIS — D329 Benign neoplasm of meninges, unspecified: Secondary | ICD-10-CM | POA: Diagnosis not present

## 2014-04-09 DIAGNOSIS — J9611 Chronic respiratory failure with hypoxia: Secondary | ICD-10-CM | POA: Diagnosis not present

## 2014-04-09 DIAGNOSIS — J8410 Pulmonary fibrosis, unspecified: Secondary | ICD-10-CM | POA: Diagnosis not present

## 2014-04-09 DIAGNOSIS — Z87891 Personal history of nicotine dependence: Secondary | ICD-10-CM | POA: Diagnosis not present

## 2014-04-09 MED ORDER — GUAIFENESIN-CODEINE 100-10 MG/5ML PO SYRP: 5.0000 mL | ORAL_SOLUTION | Freq: Three times a day (TID) | ORAL | Status: AC | PRN

## 2014-04-09 NOTE — Telephone Encounter (Signed)
OK to refill

## 2014-04-09 NOTE — Telephone Encounter (Signed)
Called refill into [facility]. Nothing further needed

## 2014-04-16 ENCOUNTER — Encounter: Payer: Self-pay | Admitting: Pulmonary Disease

## 2014-04-16 ENCOUNTER — Ambulatory Visit (INDEPENDENT_AMBULATORY_CARE_PROVIDER_SITE_OTHER): Payer: Medicare Other | Admitting: Pulmonary Disease

## 2014-04-16 VITALS — BP 134/66 | HR 84 | Ht 59.0 in | Wt 148.0 lb

## 2014-04-16 DIAGNOSIS — J432 Centrilobular emphysema: Secondary | ICD-10-CM | POA: Diagnosis not present

## 2014-04-16 DIAGNOSIS — K5900 Constipation, unspecified: Secondary | ICD-10-CM | POA: Diagnosis not present

## 2014-04-16 DIAGNOSIS — R21 Rash and other nonspecific skin eruption: Secondary | ICD-10-CM | POA: Diagnosis not present

## 2014-04-16 DIAGNOSIS — J9611 Chronic respiratory failure with hypoxia: Secondary | ICD-10-CM

## 2014-04-16 DIAGNOSIS — L84 Corns and callosities: Secondary | ICD-10-CM | POA: Insufficient documentation

## 2014-04-16 DIAGNOSIS — J8410 Pulmonary fibrosis, unspecified: Secondary | ICD-10-CM

## 2014-04-16 MED ORDER — MORPHINE SULFATE 20 MG/5ML PO SOLN: 2.5000 mg | ORAL | Status: DC | PRN

## 2014-04-16 NOTE — Progress Notes (Signed)
Subjective:    Patient ID: Kathryn Ruiz, female    DOB: 06-07-1940, 74 y.o.   MRN: [id number]  Synopsis: Kathryn Ruiz was first followed by Dr. Lamonte Sakai in the winter of 2013 the [place] office and then came to the [facility] office in June 2013 for evaluation of COPD and pulmonary fibrosis. She smoked 3 packs a day for 50 years and quit in 2001. CT chest has shown nonspecific fibrotic changes in the bases as well as a significant amount of emphysema throughout her lungs. She has a history of esophageal dysphagia requiring balloon dilation on multiple times in the past. By 2015 her shortness of breath had progressed with a decreased total lung capacity to get the nonspecific fibrotic changes on her CT had not changed. She was started on Ofev in 2015 but was unable to tolerated do to significant nausea and vomiting. She was evaluated by the [facility] lung transplant clinic and was told that she was not a good candidate due to her esophageal dysmotility.  HPI Chief Complaint  Patient presents with  . Follow-up    Pt has good and bad days.  Pt tolerating Esbriet well, does note some nausea since starting 3 tabs tid.     Kathryn Ruiz says that she has been sick lately despite her family's best effort.  She is mostly sick of her lung problems, but she really hasn't had any worsening of her baseline lung problems.  She has no appetite right now on three pills of Espriet tid since last Friday.  She has consitpation, no abdominal pain.  She is taking some stool softeners as well as miralax.  She has not been doing much lastely due to her dyspnea. She says that going out one day will wear her out. She has been using morphine regularly and it definitely helps with her dyspnea.  She has a rash on her left leg which is not getting any better over a year.  Past Medical History  Diagnosis Date  . Fibromyalgia   . GERD (gastroesophageal reflux disease)   . CHF (congestive heart failure)   . Esophageal stricture   .  Depression   . H/O hiatal hernia   . Oxygen dependent     07-05-13 oxygen 24/7 -3 l/m daytime, 4 l/m nighttime nasally.  Marland Kitchen COPD (chronic obstructive pulmonary disease)     Dr. Lake Ruiz -[facility]     Review of Systems  Constitutional: Negative for fever, chills and unexpected weight change.  HENT: Negative for congestion, nosebleeds, postnasal drip, rhinorrhea and sneezing.   Respiratory: Positive for shortness of breath. Negative for cough and choking.   Cardiovascular: Negative for chest pain and leg swelling.       Objective:   Physical Exam  Filed Vitals:   04/16/14 1414  BP: 134/66  Pulse: 84  Height: 4\' 11"  (1.499 m)  Weight: 148 lb (67.132 kg)  SpO2: 92%   4 L nasal cannula  Gen: chronically ill appearing, no acute distress HEENT: NCAT, OP clear, nasopharynx clear PULM:  No wheezing today, crackles in bases noted CV: RRR, systolic murmur noted, no JVD AB: BS+, soft, nontender Ext: Left leg excoriated rash over left shin approximately 5 cm x 8 cm   150 pack year smoking history, quit 2001 05/2011 PFT [facility] >> Ratio 61%, FEV1 1.43 L 91% pred TLC 4.05 L 106% pred DLCO 41% pred 06/2011 Overnight oximetry >> 96% of the time her O2 saturation was between 80 and 90% (94% under 88%) 06/2011  CT Chest [facility]>> marked emphysema and basilar fibrosis  08/2011 started on 3L O2 with exertion 08/2011 6MW >> 750 ft., O2 saturation 79% on exercise;  09/2011 MMRC 3 11/2011 Pulm rehab [facility] 01/2012 6MW 1010 ft, O2 sat 90% nadir 3L 05/2012 IgE > normal 05/2012 sputum AFB >> 05/2012 sputum fungal >> c. Albicans 07/2012 CT chest >> bilateral severe emphysema; question of honeycombing and interstitial thickening in the bases, not clearly UIP: [facility]ol[facility] read> no significant progression since the 06/2011 study 12/2012 PFT> ratio 65%, FEV1 1.29 L (85% pred), no change with BD, TLC 3.52L (93% pred), DLCO 6.2 (36% pred) 12/2012 ILD serology panel negative\ February 2015 barium swallow> mild  narrowing of the lower cervical esophagus which results in transient obstruction of the 12 mm barium pill, high grade narrowing of the distal esophagus just proximal to a moderate sized partially reducible hiatal hernia. Maximal measured luminal diameter is 4 mm. 12 mm barium pill would not pass. 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 She has been benefiting from the morphine in terms of increased functional capacity so I do not feel it would be a good idea to stop the morphine at this time.  Plan:  -I advised her to regularly use Colace and MiraLAX -If no improvement use senna when necessary -If no improvement then use Lubiprostone 73mcg bid (will use a low dose because there have been case reports of people complaining of shortness of breath after using this medicine)   COPD (chronic obstructive pulmonary disease) She has very severe disease and is quite symptomatic but this has been a stable interval for her.  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  This is complicating her very severe COPD causing her to have debilitating dyspnea. She is an exceedingly high risk patient and her overall prognosis is poor. She understands that using Esbriet will only hopefully slow the progression of her disease.  Plan:  -Continue Esbriet -Advised to use sunscreen   Rash and nonspecific skin eruption She has a raised, excoriated rash on her left leg which has been present for over a year. I'm really not sure what's going on here. It does not appear  to be just chronic venous stasis.  Plan:  -refer to dermatology for evaluation     Updated Medication List Outpatient Encounter Prescriptions as of 04/16/2014  Medication Sig  . budesonide (PULMICORT) 0.5 MG/2ML nebulizer solution Take 2 mLs (0.5 mg total) by nebulization 2 (two) times daily.  . diphenhydrAMINE (BENADRYL) 25 MG tablet Take 50 mg by mouth at bedtime as needed for sleep.  Marland Kitchen FLUoxetine (PROZAC) 20 MG tablet Take 20 mg by mouth every morning.   . fluticasone (VERAMYST) 27.5 MCG/SPRAY nasal spray Place 2 sprays into the nose daily.  . formoterol (PERFOROMIST) 20 MCG/2ML nebulizer solution Take 20 mcg by nebulization 2 (two) times daily.  . furosemide (LASIX) 20 MG tablet Take 20 mg by mouth every morning.   Marland Kitchen guaiFENesin-codeine (CHERATUSSIN AC) 100-10 MG/5ML syrup Take 5 mLs by mouth 3 (three) times daily as needed for cough.  Marland Kitchen  morphine 20 MG/5ML solution Take 0.6 mLs (2.4 mg total) by mouth every 4 (four) hours as needed (shortness of breath).  . pantoprazole (PROTONIX) 40 MG tablet Take 1 tablet (40 mg total) by mouth daily.  . Pirfenidone 267 MG CAPS Take 3 tablets by mouth 3 (three) times daily.  . potassium chloride (KLOR-CON) 20 MEQ packet Take 20 mEq by mouth daily.  Marland Kitchen zolpidem (AMBIEN CR) 6.25 MG CR tablet Take 1 tablet (6.25 mg total) by mouth at bedtime as needed for sleep.  . [DISCONTINUED] morphine 20 MG/5ML solution Take 0.6 mLs (2.4 mg total) by mouth every 4 (four) hours as needed (shortness of breath).  . [DISCONTINUED] predniSONE (DELTASONE) 10 MG tablet Take 30mg  daily for three days, then 20mg  daily for three days

## 2014-04-16 NOTE — Assessment & Plan Note (Signed)
She continues to use and benefit from her oxygen  Continue 4 L of oxygen continuously

## 2014-04-16 NOTE — Assessment & Plan Note (Signed)
She has very severe disease and is quite symptomatic but this has been a stable interval for her.  Plan:  -Continue Pulmicort and Perforomist

## 2014-04-16 NOTE — Assessment & Plan Note (Signed)
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  This is complicating her very severe COPD causing her to have debilitating dyspnea. She is an exceedingly high risk patient and her overall prognosis is poor. She understands that using Esbriet will only hopefully slow the progression of her disease.  Plan:  -Continue Esbriet -Advised to use sunscreen

## 2014-04-16 NOTE — Assessment & Plan Note (Signed)
She has a raised, excoriated rash on her left leg which has been present for over a year. I'm really not sure what's going on here. It does not appear to be just chronic venous stasis.  Plan:  -refer to dermatology for evaluation

## 2014-04-16 NOTE — Patient Instructions (Signed)
For the constipation:  Take colace twice a day and miralax once a day If no improvement then take Senna daily (ex-lax)  If no improvement, then use Lubiprostone 1mcg twice a day, this medication made some people short of breath so watch out for that, I have prescribed a very small dose  Keep using the morphine as needed for shortness of breath  Keep taking the Esbriet three times a day  We will refer you to a Dermatologist  We will see you back in 2 months or sooner if needed

## 2014-04-16 NOTE — Assessment & Plan Note (Signed)
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 She has been benefiting from the morphine in terms of increased functional capacity so I do not feel it would be a good idea to stop the morphine at this time.  Plan:  -I advised her to regularly use Colace and MiraLAX -If no improvement use senna when necessary -If no improvement then use Lubiprostone 2mcg bid (will use a low dose because there have been case reports of people complaining of shortness of breath after using this medicine)

## 2014-04-18 LAB — AFB CULTURE WITH SMEAR (NOT AT ARMC)
ACID FAST SMEAR: NONE SEEN
Acid Fast Smear: NONE SEEN

## 2014-04-24 DIAGNOSIS — I509 Heart failure, unspecified: Secondary | ICD-10-CM | POA: Diagnosis not present

## 2014-04-24 DIAGNOSIS — D329 Benign neoplasm of meninges, unspecified: Secondary | ICD-10-CM | POA: Diagnosis not present

## 2014-04-24 DIAGNOSIS — J449 Chronic obstructive pulmonary disease, unspecified: Secondary | ICD-10-CM | POA: Diagnosis not present

## 2014-04-24 DIAGNOSIS — Z87891 Personal history of nicotine dependence: Secondary | ICD-10-CM | POA: Diagnosis not present

## 2014-04-24 DIAGNOSIS — J8410 Pulmonary fibrosis, unspecified: Secondary | ICD-10-CM | POA: Diagnosis not present

## 2014-04-24 DIAGNOSIS — J9611 Chronic respiratory failure with hypoxia: Secondary | ICD-10-CM | POA: Diagnosis not present

## 2014-04-29 LAB — AFB CULTURE WITH SMEAR (NOT AT ARMC): ACID FAST SMEAR: NONE SEEN

## 2014-05-08 DIAGNOSIS — Z87891 Personal history of nicotine dependence: Secondary | ICD-10-CM | POA: Diagnosis not present

## 2014-05-08 DIAGNOSIS — J8410 Pulmonary fibrosis, unspecified: Secondary | ICD-10-CM | POA: Diagnosis not present

## 2014-05-08 DIAGNOSIS — I509 Heart failure, unspecified: Secondary | ICD-10-CM | POA: Diagnosis not present

## 2014-05-08 DIAGNOSIS — J449 Chronic obstructive pulmonary disease, unspecified: Secondary | ICD-10-CM | POA: Diagnosis not present

## 2014-05-08 DIAGNOSIS — D329 Benign neoplasm of meninges, unspecified: Secondary | ICD-10-CM | POA: Diagnosis not present

## 2014-05-08 DIAGNOSIS — J9611 Chronic respiratory failure with hypoxia: Secondary | ICD-10-CM | POA: Diagnosis not present

## 2014-05-14 ENCOUNTER — Telehealth: Payer: Self-pay | Admitting: Pulmonary Disease

## 2014-05-14 ENCOUNTER — Encounter: Payer: Self-pay | Admitting: Pulmonary Disease

## 2014-05-14 NOTE — Telephone Encounter (Signed)
lmtcb X1 for Amy

## 2014-05-15 NOTE — Telephone Encounter (Signed)
Amy called in regards to this pt. She has everything she needs, nothing further needed at this time.

## 2014-05-15 NOTE — Telephone Encounter (Signed)
LMTCB x2 for Kathryn Ruiz.  

## 2014-05-21 DIAGNOSIS — J449 Chronic obstructive pulmonary disease, unspecified: Secondary | ICD-10-CM | POA: Diagnosis not present

## 2014-05-21 DIAGNOSIS — I509 Heart failure, unspecified: Secondary | ICD-10-CM | POA: Diagnosis not present

## 2014-05-21 DIAGNOSIS — D329 Benign neoplasm of meninges, unspecified: Secondary | ICD-10-CM | POA: Diagnosis not present

## 2014-05-21 DIAGNOSIS — Z87891 Personal history of nicotine dependence: Secondary | ICD-10-CM | POA: Diagnosis not present

## 2014-05-21 DIAGNOSIS — J8410 Pulmonary fibrosis, unspecified: Secondary | ICD-10-CM | POA: Diagnosis not present

## 2014-05-21 DIAGNOSIS — J9611 Chronic respiratory failure with hypoxia: Secondary | ICD-10-CM | POA: Diagnosis not present

## 2014-06-05 DIAGNOSIS — I8310 Varicose veins of unspecified lower extremity with inflammation: Secondary | ICD-10-CM | POA: Diagnosis not present

## 2014-06-05 DIAGNOSIS — L400 Psoriasis vulgaris: Secondary | ICD-10-CM | POA: Diagnosis not present

## 2014-06-06 DIAGNOSIS — J8410 Pulmonary fibrosis, unspecified: Secondary | ICD-10-CM | POA: Diagnosis not present

## 2014-06-06 DIAGNOSIS — J9611 Chronic respiratory failure with hypoxia: Secondary | ICD-10-CM | POA: Diagnosis not present

## 2014-06-06 DIAGNOSIS — Z87891 Personal history of nicotine dependence: Secondary | ICD-10-CM | POA: Diagnosis not present

## 2014-06-06 DIAGNOSIS — D329 Benign neoplasm of meninges, unspecified: Secondary | ICD-10-CM | POA: Diagnosis not present

## 2014-06-06 DIAGNOSIS — I509 Heart failure, unspecified: Secondary | ICD-10-CM | POA: Diagnosis not present

## 2014-06-06 DIAGNOSIS — J449 Chronic obstructive pulmonary disease, unspecified: Secondary | ICD-10-CM | POA: Diagnosis not present

## 2014-06-25 DIAGNOSIS — L400 Psoriasis vulgaris: Secondary | ICD-10-CM | POA: Diagnosis not present

## 2014-06-26 ENCOUNTER — Encounter: Payer: Self-pay | Admitting: Pulmonary Disease

## 2014-06-26 ENCOUNTER — Other Ambulatory Visit
Admission: RE | Admit: 2014-06-26 | Discharge: 2014-06-26 | Disposition: A | Discharge status: Home / Self Care | Payer: Medicare Other | Source: Ambulatory Visit | Attending: Pulmonary Disease | Admitting: Pulmonary Disease

## 2014-06-26 ENCOUNTER — Ambulatory Visit (INDEPENDENT_AMBULATORY_CARE_PROVIDER_SITE_OTHER): Payer: Medicare Other | Admitting: Pulmonary Disease

## 2014-06-26 VITALS — BP 140/74 | HR 87 | Ht 59.0 in | Wt 141.0 lb

## 2014-06-26 DIAGNOSIS — J9611 Chronic respiratory failure with hypoxia: Secondary | ICD-10-CM

## 2014-06-26 DIAGNOSIS — J438 Other emphysema: Secondary | ICD-10-CM

## 2014-06-26 DIAGNOSIS — Z5181 Encounter for therapeutic drug level monitoring: Secondary | ICD-10-CM

## 2014-06-26 DIAGNOSIS — F329 Major depressive disorder, single episode, unspecified: Secondary | ICD-10-CM

## 2014-06-26 DIAGNOSIS — J8410 Pulmonary fibrosis, unspecified: Secondary | ICD-10-CM | POA: Diagnosis not present

## 2014-06-26 DIAGNOSIS — F32A Depression, unspecified: Secondary | ICD-10-CM | POA: Insufficient documentation

## 2014-06-26 DIAGNOSIS — R5383 Other fatigue: Secondary | ICD-10-CM

## 2014-06-26 LAB — CBC WITH DIFFERENTIAL/PLATELET
Basophils Absolute: 0.1 10*3/uL (ref 0–0.1)
Basophils Relative: 1 %
EOS PCT: 4 %
Eosinophils Absolute: 0.3 10*3/uL (ref 0–0.7)
HEMATOCRIT: 39.7 % (ref 35.0–47.0)
HEMOGLOBIN: 12.4 g/dL (ref 12.0–16.0)
LYMPHS ABS: 2.5 10*3/uL (ref 1.0–3.6)
Lymphocytes Relative: 29 %
MCH: 24 pg — ABNORMAL LOW (ref 26.0–34.0)
MCHC: 31.2 g/dL — ABNORMAL LOW (ref 32.0–36.0)
MCV: 77 fL — ABNORMAL LOW (ref 80.0–100.0)
Monocytes Absolute: 0.9 10*3/uL (ref 0.2–0.9)
Monocytes Relative: 11 %
Neutro Abs: 4.9 10*3/uL (ref 1.4–6.5)
Neutrophils Relative %: 57 %
PLATELETS: 289 10*3/uL (ref 150–440)
RBC: 5.15 MIL/uL (ref 3.80–5.20)
RDW: 19.3 % — AB (ref 11.5–14.5)
WBC: 8.7 10*3/uL (ref 3.6–11.0)

## 2014-06-26 LAB — BASIC METABOLIC PANEL
Anion gap: 10 (ref 5–15)
BUN: 19 mg/dL (ref 6–20)
CHLORIDE: 106 mmol/L (ref 101–111)
CO2: 27 mmol/L (ref 22–32)
Calcium: 9.1 mg/dL (ref 8.9–10.3)
Creatinine, Ser: 0.81 mg/dL (ref 0.44–1.00)
GFR calc non Af Amer: >60 mL/min (ref >60)
GLUCOSE: 87 mg/dL (ref 65–99)
Potassium: 3.9 mmol/L (ref 3.5–5.1)
Sodium: 143 mmol/L (ref 135–145)

## 2014-06-26 LAB — HEPATIC FUNCTION PANEL
ALBUMIN: 3.9 g/dL (ref 3.5–5.0)
ALT: 13 U/L — ABNORMAL LOW (ref 14–54)
AST: 23 U/L (ref 15–41)
Alkaline Phosphatase: 165 U/L — ABNORMAL HIGH (ref 38–126)
Bilirubin, Direct: <0.1 mg/dL — ABNORMAL LOW (ref 0.1–0.5)
TOTAL PROTEIN: 7.5 g/dL (ref 6.5–8.1)
Total Bilirubin: 0.5 mg/dL (ref 0.3–1.2)

## 2014-06-26 NOTE — Patient Instructions (Signed)
Keep taking the Esbriet as you are doing We collect labs for you and call you with the results Increase the Fluoxetine to 40mg  daily WE will see you back in 3 months or sooner if needed

## 2014-06-26 NOTE — Assessment & Plan Note (Signed)
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 Her oxygenation has not worsened nor has her functional status in the last several months I consider this a victory. She understands that the Esbriet will only slow the progression of disease. I explained to her today that because she has not had worsening in her oxygenation recently I consider that a success.  Plan: She was advised again today at length to protect her self from the sun because of the pirfenidone and we will check a CBC and LFT today to ensure there is no drug toxicity Continue pirfenidone 3 pills 3 times a day Pulmonary function test in 3 months Follow-up 3 months repeat LFT at that time

## 2014-06-26 NOTE — Assessment & Plan Note (Signed)
This has been a stable interval for Kathryn Ruiz. She has not had an exacerbation of the COPD since last visit.  Plan: Continue taking Perforomist and Pulmicort

## 2014-06-26 NOTE — Assessment & Plan Note (Signed)
She is complaining of a significant amount of anxiety and fatigue. I explained her that it is common for people with chronic lung disease to have depression and anxiety. However, it's worth checking a CBC and metabolic panel to ensure that there is no toxicity from the pirfenidone.  Plan: Because of all of her anxiety I'm going to increase the Prozac to 40 mg daily Check CBC and comprehensive metabolic panel  Greater than 25 minutes total were spent the visit today answering questions from the patient and her family

## 2014-06-26 NOTE — Progress Notes (Signed)
Subjective:    Patient ID: Kathryn Ruiz, female    DOB: [birth year], 74 y.o.   MRN: [id number]  Synopsis: Kathryn Ruiz was first followed by Dr. Lamonte Sakai in the winter of 2013 the [place] office and then came to the [facility] office in June 2013 for evaluation of COPD and pulmonary fibrosis. She smoked 3 packs a day for 50 years and quit in 2001. CT chest has shown nonspecific fibrotic changes in the bases as well as a significant amount of emphysema throughout her lungs. She has a history of esophageal dysphagia requiring balloon dilation on multiple times in the past. By 2015 her shortness of breath had progressed with a decreased total lung capacity to get the nonspecific fibrotic changes on her CT had not changed. She was started on Ofev in 2015 but was unable to tolerated do to significant nausea and vomiting. She was evaluated by the [facility] lung transplant clinic and was told that she was not a good candidate due to her esophageal dysmotility.  HPI Chief Complaint  Patient presents with  . Follow-up    Pt tolerating Esbriet well, as well as morphine.  Does note some GI issues.  Pt has sob with exertion, prod cough with green mucus.  Pt is requesting a printout of a CXR that shows her fibrosis.     Kathryn Ruiz has been doing well since the last visit. She says that her GI symptoms are tolerable, minimal at best.  She says that the rash on her leg is better, she has been seeing the dermatologist lately for this.   She continues to feel fatigued after a day of getting out (yard work, doctor's visits).  She doesn't feel sleepy, just fatigued.   She says that she's been feeling anxious and jittery a lot lately. She says her shortness of breath has been about the same. She has minimal cough and no mucus production. She continues to use and benefit from her oxygen on a routine basis  Past Medical History  Diagnosis Date  . Fibromyalgia   . GERD (gastroesophageal reflux disease)   . CHF (congestive heart  failure)   . Esophageal stricture   . Depression   . H/O hiatal hernia   . Oxygen dependent     07-05-13 oxygen 24/7 -3 l/m daytime, 4 l/m nighttime nasally.  Marland Kitchen COPD (chronic obstructive pulmonary disease)     Dr. Lake Bells -[facility]     Review of Systems  Constitutional: Negative for fever, chills and unexpected weight change.  HENT: Negative for congestion, nosebleeds, postnasal drip, rhinorrhea and sneezing.   Respiratory: Positive for shortness of breath. Negative for cough and choking.   Cardiovascular: Negative for chest pain and leg swelling.       Objective:   Physical Exam  Filed Vitals:   06/26/14 1532  BP: 140/74  Pulse: 87  Height: 4\' 11"  (1.499 m)  Weight: 141 lb (63.957 kg)  SpO2: 94%   4 L nasal cannula  Gen: chronically ill appearing, no acute distress HEENT: NCAT, OP clear, nasopharynx clear PULM:  No wheezing today, crackles in bases noted CV: RRR, systolic murmur noted, no JVD AB: BS+, soft, nontender Ext: Left leg excoriated rash improved since the last exam  150 pack year smoking history, quit 2001 05/2011 PFT [facility] >> Ratio 61%, FEV1 1.43 L 91% pred TLC 4.05 L 106% pred DLCO 41% pred 06/2011 Overnight oximetry >> 96% of the time her O2 saturation was between 80 and 90% (94% under 88%)  06/2011 CT Chest [facility]>> marked emphysema and basilar fibrosis  08/2011 started on 3L O2 with exertion 08/2011 6MW >> 750 ft., O2 saturation 79% on exercise;  09/2011 MMRC 3 11/2011 Pulm rehab [facility] 01/2012 6MW 1010 ft, O2 sat 90% nadir 3L 05/2012 IgE > normal 05/2012 sputum AFB >> 05/2012 sputum fungal >> c. Albicans 07/2012 CT chest >> bilateral severe emphysema; question of honeycombing and interstitial thickening in the bases, not clearly UIP: [facility] read> no significant progression since the 06/2011 study 12/2012 PFT> ratio 65%, FEV1 1.29 L (85% pred), no change with BD, TLC 3.52L (93% pred), DLCO 6.2 (36% pred) 12/2012 ILD serology panel negative\ February 2015  barium swallow> mild narrowing of the lower cervical esophagus which results in transient obstruction of the 12 mm barium pill, high grade narrowing of the distal esophagus just proximal to a moderate sized partially reducible hiatal hernia. Maximal measured luminal diameter is 4 mm. 12 mm barium pill would not pass. 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 02/2013 Echo> Normal LVEF, mild LVH and diastolic dysfunction; normal RV and RVSP 03/2013 rejected for consideration for lung transplant [facility] (dysphagia)     Assessment & Plan:   COPD (chronic obstructive pulmonary disease) This has been a stable interval for Kathryn Ruiz. She has not had an exacerbation of the COPD since last visit.  Plan: Continue taking Perforomist and Pulmicort   Chronic hypoxemic respiratory failure Stable interval, continue 4 L of oxygen continuously   Pulmonary fibrosis She has not had progression of her fibrotic changes in the bases of her lungs which we feel are due to usual interstitial pneumonitis. Her oxygenation has not worsened nor has her functional status in the last several months I consider this a victory. She understands that the Esbriet will only slow the progression of disease. I explained to her today that because she has not had worsening in her oxygenation recently I consider that a success.  Plan: She was advised again today at length to protect her self from the sun because of the pirfenidone and we will check a CBC and LFT today to ensure there is no drug toxicity Continue pirfenidone 3 pills 3 times a day Pulmonary function test in 3 months Follow-up 3 months repeat LFT at that time   Fatigue due to depression She is complaining of a significant amount of anxiety and fatigue. I explained her that it is common for people with chronic lung disease to have depression and anxiety. However, it's worth checking a CBC and metabolic panel to ensure that there is no toxicity from the  pirfenidone.  Plan: Because of all of her anxiety I'm going to increase the Prozac to 40 mg daily Check CBC and comprehensive metabolic panel  Greater than 25 minutes total were spent the visit today answering questions from the patient and her family     Updated Medication List Outpatient Encounter Prescriptions as of 06/26/2014  Medication Sig  . budesonide (PULMICORT) 0.5 MG/2ML nebulizer solution Take 2 mLs (0.5 mg total) by nebulization 2 (two) times daily.  . diphenhydrAMINE (BENADRYL) 25 MG tablet Take 50 mg by mouth at bedtime as needed for sleep.  Marland Kitchen FLUoxetine (PROZAC) 20 MG tablet Take 20 mg by mouth every morning.   . fluticasone (VERAMYST) 27.5 MCG/SPRAY nasal spray Place 2 sprays into the nose daily.  . formoterol (PERFOROMIST) 20 MCG/2ML nebulizer solution Take 20 mcg by nebulization 2 (two) times daily.  . furosemide (LASIX) 20 MG tablet Take 20 mg by mouth  every morning.   Marland Kitchen guaiFENesin-codeine (CHERATUSSIN AC) 100-10 MG/5ML syrup Take 5 mLs by mouth 3 (three) times daily as needed for cough.  . morphine 20 MG/5ML solution Take 0.6 mLs (2.4 mg total) by mouth every 4 (four) hours as needed (shortness of breath).  . pantoprazole (PROTONIX) 40 MG tablet Take 1 tablet (40 mg total) by mouth daily.  . Pirfenidone 267 MG CAPS Take 3 tablets by mouth 3 (three) times daily.  . potassium chloride (KLOR-CON) 20 MEQ packet Take 20 mEq by mouth daily.  Marland Kitchen zolpidem (AMBIEN CR) 6.25 MG CR tablet Take 1 tablet (6.25 mg total) by mouth at bedtime as needed for sleep.   No facility-administered encounter medications on file as of 06/26/2014.

## 2014-06-26 NOTE — Assessment & Plan Note (Signed)
Stable interval, continue 4 L of oxygen continuously

## 2014-06-27 ENCOUNTER — Other Ambulatory Visit: Payer: Self-pay

## 2014-06-27 MED ORDER — FLUOXETINE HCL 40 MG PO CAPS: 40.0000 mg | ORAL_CAPSULE | Freq: Every day | ORAL | Status: DC

## 2014-07-31 ENCOUNTER — Telehealth: Payer: Self-pay | Admitting: Pulmonary Disease

## 2014-07-31 MED ORDER — MORPHINE SULFATE 20 MG/5ML PO SOLN: 2.5000 mg | ORAL | Status: DC | PRN

## 2014-07-31 NOTE — Telephone Encounter (Signed)
Per BQ ok to refill.   Pt has been called, aware that this refill is up front ready for pick up in [place] office.  Nothing further needed at this time

## 2014-07-31 NOTE — Telephone Encounter (Signed)
Last refill on morphine was 04/16/14 #250 ML x 0 refills  Take 0.6 mLs (2.4 mg total) by mouth every 4 (four) hours as needed (shortness of breath).  Last OV 06/26/14 Pt reports she will run out this week and needs refilled. She is wanting to pick this up in the [place] office today. Please advise thanks

## 2014-08-02 ENCOUNTER — Other Ambulatory Visit: Payer: Self-pay

## 2014-08-02 DIAGNOSIS — R1310 Dysphagia, unspecified: Secondary | ICD-10-CM

## 2014-08-02 MED ORDER — PANTOPRAZOLE SODIUM 40 MG PO TBEC: 40.0000 mg | DELAYED_RELEASE_TABLET | Freq: Every day | ORAL | Status: DC

## 2014-08-21 ENCOUNTER — Ambulatory Visit (INDEPENDENT_AMBULATORY_CARE_PROVIDER_SITE_OTHER): Payer: Medicare Other | Admitting: Pulmonary Disease

## 2014-08-21 ENCOUNTER — Encounter: Payer: Self-pay | Admitting: Pulmonary Disease

## 2014-08-21 VITALS — BP 146/78 | HR 80 | Ht 59.0 in | Wt 139.0 lb

## 2014-08-21 DIAGNOSIS — J8410 Pulmonary fibrosis, unspecified: Secondary | ICD-10-CM | POA: Diagnosis not present

## 2014-08-21 NOTE — Progress Notes (Signed)
Subjective:    Patient ID: Kathryn Ruiz, female    DOB: 23-Apr-1940, 74 y.o.   MRN: [id number]  Synopsis: Loistine Eberlin was first followed by Dr. Lamonte Sakai in the winter of 2013 the [place] office and then came to the [facility] office in June 2013 for evaluation of COPD and pulmonary fibrosis. She smoked 3 packs a day for 50 years and quit in 2001. CT chest has shown nonspecific fibrotic changes in the bases as well as a significant amount of emphysema throughout her lungs. She has a history of esophageal dysphagia requiring balloon dilation on multiple times in the past. By 2015 her shortness of breath had progressed with a decreased total lung capacity to get the nonspecific fibrotic changes on her CT had not changed. She was started on Ofev in 2015 but was unable to tolerated do to significant nausea and vomiting. She was evaluated by the [facility] lung transplant clinic and was told that she was not a good candidate due to her esophageal dysmotility.  HPI Chief Complaint  Patient presents with  . Follow-up    Pt here today d/t her Esbriet copay going from $40/mo to $340/mo.  Pt states she is doing well, sob with exertion.     She is here today because she is concerned about the cost of her Esbriet. Currently she is receiving a financial benefit from a Medicare Part D coverage.  This is the ONLY reason for her visit today. She is tolerating the Esbriet without difficulty.  She has occassional constipation.  Past Medical History  Diagnosis Date  . Fibromyalgia   . GERD (gastroesophageal reflux disease)   . CHF (congestive heart failure)   . Esophageal stricture   . Depression   . H/O hiatal hernia   . Oxygen dependent     07-05-13 oxygen 24/7 -3 l/m daytime, 4 l/m nighttime nasally.  Marland Kitchen COPD (chronic obstructive pulmonary disease)     Dr. Lake Bells -[facility]     Review of Systems  Constitutional: Negative for fever, chills and unexpected weight change.  HENT: Negative for congestion,  nosebleeds, postnasal drip, rhinorrhea and sneezing.   Respiratory: Positive for shortness of breath. Negative for cough and choking.   Cardiovascular: Negative for chest pain and leg swelling.       Objective:   Physical Exam  Filed Vitals:   08/21/14 1443  BP: 146/78  Pulse: 80  Height: 4\' 11"  (1.499 m)  Weight: 139 lb (63.05 kg)  SpO2: 92%   4 L nasal cannula  Gen: chronically ill appearing, no acute distress HEENT: NCAT, OP clear, nasopharynx clear PULM:  No wheezing today, crackles in bases noted CV: RRR, systolic murmur noted, no JVD AB: BS+, soft, nontender Ext: Left leg excoriated rash improved since the last exam  150 pack year smoking history, quit 2001 05/2011 PFT [facility] >> Ratio 61%, FEV1 1.43 L 91% pred TLC 4.05 L 106% pred DLCO 41% pred 06/2011 Overnight oximetry >> 96% of the time her O2 saturation was between 80 and 90% (94% under 88%) 06/2011 CT Chest [facility]>> marked emphysema and basilar fibrosis  08/2011 started on 3L O2 with exertion 08/2011 6MW >> 750 ft., O2 saturation 79% on exercise;  09/2011 MMRC 3 11/2011 Pulm rehab [facility] 01/2012 6MW 1010 ft, O2 sat 90% nadir 3L 05/2012 IgE > normal 05/2012 sputum AFB >> 05/2012 sputum fungal >> c. Albicans 07/2012 CT chest >> bilateral severe emphysema; question of honeycombing and interstitial thickening in the bases, not clearly UIP:  Melanye Hiraldo read> no significant progression since the 06/2011 study 12/2012 PFT> ratio 65%, FEV1 1.29 L (85% pred), no change with BD, TLC 3.52L (93% pred), DLCO 6.2 (36% pred) 12/2012 ILD serology panel negative\ February 2015 barium swallow> mild narrowing of the lower cervical esophagus which results in transient obstruction of the 12 mm barium pill, high grade narrowing of the distal esophagus just proximal to a moderate sized partially reducible hiatal hernia. Maximal measured luminal diameter is 4 mm. 12 mm barium pill would not pass. 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 02/2013 Echo> Normal  LVEF, mild LVH and diastolic dysfunction; normal RV and RVSP 03/2013 rejected for consideration for lung transplant [facility] (dysphagia)     Assessment & Plan:   Pulmonary fibrosis She is here today only to help sort out how she can continue to afford her anti-fibrotic therapy.  It should be noted that she has 2 forms of advanced chronic lung disease including COPD as well as idiopathic pulmonary fibrosis. She has been tolerating Esbriet without difficulty and we feel that this medication is crucial because it helps slow the progression of her idiopathic pulmonary fibrosis. However, should she have to pay the proposed co-pay next month she will no longer be able to afford the medication.  We will write a letter today to appeal to her insurance companies to cover the [place].  Continue Esbriet  Follow-up with me as previously scheduled    Updated Medication List Outpatient Encounter Prescriptions as of 08/21/2014  Medication Sig  . budesonide (PULMICORT) 0.5 MG/2ML nebulizer solution Take 2 mLs (0.5 mg total) by nebulization 2 (two) times daily.  . diphenhydrAMINE (BENADRYL) 25 MG tablet Take 50 mg by mouth at bedtime as needed for sleep.  Marland Kitchen FLUoxetine (PROZAC) 40 MG capsule Take 1 capsule (40 mg total) by mouth daily.  . fluticasone (VERAMYST) 27.5 MCG/SPRAY nasal spray Place 2 sprays into the nose daily.  . formoterol (PERFOROMIST) 20 MCG/2ML nebulizer solution Take 20 mcg by nebulization 2 (two) times daily.  . furosemide (LASIX) 20 MG tablet Take 20 mg by mouth every morning.   Marland Kitchen guaiFENesin-codeine (CHERATUSSIN AC) 100-10 MG/5ML syrup Take 5 mLs by mouth 3 (three) times daily as needed for cough.  . morphine 20 MG/5ML solution Take 0.6 mLs (2.4 mg total) by mouth every 4 (four) hours as needed (shortness of breath). (Patient taking differently: Take 2.5 mg by mouth every 4 (four) hours. )  . pantoprazole (PROTONIX) 40 MG tablet Take 1 tablet (40 mg total) by mouth daily.  . Pirfenidone  267 MG CAPS Take 3 tablets by mouth 3 (three) times daily.  . potassium chloride (KLOR-CON) 20 MEQ packet Take 20 mEq by mouth daily.  Marland Kitchen zolpidem (AMBIEN CR) 6.25 MG CR tablet Take 1 tablet (6.25 mg total) by mouth at bedtime as needed for sleep.   No facility-administered encounter medications on file as of 08/21/2014.

## 2014-08-21 NOTE — Assessment & Plan Note (Signed)
She is here today only to help sort out how she can continue to afford her anti-fibrotic therapy.  It should be noted that she has 2 forms of advanced chronic lung disease including COPD as well as idiopathic pulmonary fibrosis. She has been tolerating Esbriet without difficulty and we feel that this medication is crucial because it helps slow the progression of her idiopathic pulmonary fibrosis. However, should she have to pay the proposed co-pay next month she will no longer be able to afford the medication.  We will write a letter today to appeal to her insurance companies to cover the [place].  Continue Esbriet  Follow-up with me as previously scheduled

## 2014-08-21 NOTE — Patient Instructions (Signed)
We will contact the representative from the company to cells your anti-fibrotic drug to see what the status of your financial assistance is We will see you back in September as previously scheduled

## 2014-08-26 ENCOUNTER — Telehealth: Payer: Self-pay | Admitting: Pulmonary Disease

## 2014-08-26 NOTE — Telephone Encounter (Signed)
Spoke with pt, grant is covering her copay for this medication(!) Nothing further needed.

## 2014-08-26 NOTE — Telephone Encounter (Signed)
Pt called and wanted to let Caryl Pina know she was approved for a $9000 grant through [employer] to help pay for her medication. Nothing further needed

## 2014-08-26 NOTE — Telephone Encounter (Signed)
Spoke with the pt  She states that she had been paying 40 $ per month for esbriet, and now they are asking her to pay over 300 $ per month  She is unable to afford this and states BQ said he would have AC call the drug rep for Esbriet to check on this  Ashely, please advise thanks

## 2014-08-26 NOTE — Telephone Encounter (Signed)
I have called our regional rep for Esbriet, Rayshone Pringle at [phone] to see if she has any way of getting this lowered. A letter had been sent  to [facility] [employer] for a tier exception, which has been denied.  An appeal can be done on this. Per pt, her insurance only covers the [place] and she does not have any financial assistance for this medication.    Spoke with Mikail at [employer], states that he is able to refer pt to a nonprofit copay assistance program since she has Medicare, and would be calling pt this afternoon to obtain necessary information for this referral.  I called pt to make her aware of the above.  Pt will notify us if she hears anything more about this grant, and the organization will contact our office if they require any additional information from [place].    Nothing further needed at this time. Forwarding to BQ to keep him aware of this.

## 2014-08-27 NOTE — Telephone Encounter (Signed)
Thanks

## 2014-09-23 ENCOUNTER — Other Ambulatory Visit: Payer: Self-pay

## 2014-09-23 MED ORDER — PIRFENIDONE 267 MG PO CAPS: 3.0000 | ORAL_CAPSULE | Freq: Three times a day (TID) | ORAL | Status: DC

## 2014-10-16 ENCOUNTER — Telehealth: Payer: Self-pay | Admitting: Pulmonary Disease

## 2014-10-16 MED ORDER — MORPHINE SULFATE 20 MG/5ML PO SOLN: 2.5000 mg | ORAL | Status: DC | PRN

## 2014-10-16 NOTE — Telephone Encounter (Signed)
RX printed and signed and placed for pick up. Nothing further needed

## 2014-10-16 NOTE — Telephone Encounter (Signed)
OK to refill

## 2014-10-16 NOTE — Telephone Encounter (Signed)
Pt requesting refill on morphine 20 MG/5ML solution  Take 0.6 mLs (2.4 mg total) by mouth every 4 (four) hours as needed (shortness of breath). Last refilled 07/31/14 #250 ml x 0 refills Please advise thanks

## 2014-10-16 NOTE — Telephone Encounter (Signed)
Pt calling to check on status of rx, explained that [facility] has been very busy to day and that someone would contact her as soon as they heard from [place]

## 2014-10-18 ENCOUNTER — Encounter: Payer: Self-pay | Admitting: Family Medicine

## 2014-10-18 ENCOUNTER — Ambulatory Visit (INDEPENDENT_AMBULATORY_CARE_PROVIDER_SITE_OTHER): Payer: Medicare Other | Admitting: Family Medicine

## 2014-10-18 ENCOUNTER — Encounter (INDEPENDENT_AMBULATORY_CARE_PROVIDER_SITE_OTHER): Payer: Self-pay

## 2014-10-18 VITALS — BP 151/77 | HR 77 | Temp 98.6°F | Resp 16 | Ht 58.4 in | Wt 135.0 lb

## 2014-10-18 DIAGNOSIS — J431 Panlobular emphysema: Secondary | ICD-10-CM | POA: Diagnosis not present

## 2014-10-18 DIAGNOSIS — M797 Fibromyalgia: Secondary | ICD-10-CM | POA: Insufficient documentation

## 2014-10-18 DIAGNOSIS — R29898 Other symptoms and signs involving the musculoskeletal system: Secondary | ICD-10-CM

## 2014-10-18 DIAGNOSIS — Z23 Encounter for immunization: Secondary | ICD-10-CM | POA: Diagnosis not present

## 2014-10-18 MED ORDER — DULOXETINE HCL 30 MG PO CPEP
30.0000 mg | ORAL_CAPSULE | Freq: Every day | ORAL | Status: DC
Start: 2014-10-18 — End: 2014-10-21

## 2014-10-18 NOTE — Progress Notes (Signed)
Name: Kathryn Ruiz   MRN: [id number]    DOB: 1940-10-12   Date:10/18/2014       Progress Note  Subjective  Chief Complaint  Chief Complaint  Patient presents with  . Fibromyalgia    HPI  Here c/o worsening fibromyalgia pain .  Lots of extra pain esp. In R. Arm and sholuder and foot, but really has increased pain all over.  Breathing is stable on O2 (2-4L/min).   No problem-specific assessment & plan notes found for this encounter.   Past Medical History  Diagnosis Date  . Fibromyalgia   . GERD (gastroesophageal reflux disease)   . CHF (congestive heart failure)   . Esophageal stricture   . Depression   . H/O hiatal hernia   . Oxygen dependent     07-05-13 oxygen 24/7 -3 l/m daytime, 4 l/m nighttime nasally.  Marland Kitchen COPD (chronic obstructive pulmonary disease)     Dr. Lake Bells -[facility]    Social History  Substance Use Topics  . Smoking status: Former Smoker -- 3.00 packs/day for 50 years    Types: Cigarettes    Quit date: 01/26/1999  . Smokeless tobacco: Never Used  . Alcohol Use: No     Current outpatient prescriptions:  .  budesonide (PULMICORT) 0.5 MG/2ML nebulizer solution, Take 2 mLs (0.5 mg total) by nebulization 2 (two) times daily., Disp: 120 mL, Rfl: 12 .  diphenhydrAMINE (BENADRYL) 25 MG tablet, Take 50 mg by mouth at bedtime as needed for sleep., Disp: , Rfl:  .  FLUoxetine (PROZAC) 40 MG capsule, Take 1 capsule (40 mg total) by mouth daily., Disp: 30 capsule, Rfl: 2 .  fluticasone (VERAMYST) 27.5 MCG/SPRAY nasal spray, Place 2 sprays into the nose daily., Disp: , Rfl:  .  formoterol (PERFOROMIST) 20 MCG/2ML nebulizer solution, Take 20 mcg by nebulization 2 (two) times daily., Disp: , Rfl:  .  furosemide (LASIX) 20 MG tablet, Take 20 mg by mouth every morning. , Disp: , Rfl:  .  guaiFENesin-codeine (CHERATUSSIN AC) 100-10 MG/5ML syrup, Take 5 mLs by mouth 3 (three) times daily as needed for cough., Disp: 120 mL, Rfl: 0 .  morphine 20 MG/5ML solution,  Take 0.6 mLs (2.4 mg total) by mouth every 4 (four) hours as needed (shortness of breath)., Disp: 250 mL, Rfl: 0 .  pantoprazole (PROTONIX) 40 MG tablet, Take 1 tablet (40 mg total) by mouth daily., Disp: 90 tablet, Rfl: 3 .  Pirfenidone 267 MG CAPS, Take 3 capsules by mouth 3 (three) times daily., Disp: 270 capsule, Rfl: 11 .  potassium chloride (KLOR-CON) 20 MEQ packet, Take 20 mEq by mouth daily., Disp: , Rfl:  .  zolpidem (AMBIEN CR) 6.25 MG CR tablet, Take 1 tablet (6.25 mg total) by mouth at bedtime as needed for sleep., Disp: 15 tablet, Rfl: 0  No Known Allergies  Review of Systems  Constitutional: Positive for malaise/fatigue. Negative for fever and chills.  Eyes: Negative for blurred vision and double vision.  Respiratory: Negative for cough, sputum production, shortness of breath and wheezing.   Cardiovascular: Negative for chest pain, palpitations, orthopnea and leg swelling.  Gastrointestinal: Negative for heartburn, abdominal pain and blood in stool.  Genitourinary: Negative for dysuria, urgency and frequency.  Musculoskeletal: Positive for myalgias, back pain, joint pain and neck pain.  Skin: Negative for rash.  Neurological: Positive for weakness. Negative for dizziness, sensory change, focal weakness and headaches.  Psychiatric/Behavioral: Positive for depression.      Objective  Filed Vitals:  10/18/14 1428  BP: 151/77  Pulse: 77  Temp: 98.6 F (37 C)  TempSrc: Oral  Resp: 16  Height: 4' 10.4" (1.483 m)  Weight: 135 lb (61.236 kg)     Physical Exam  Constitutional: She is oriented to person, place, and time and well-developed, well-nourished, and in no distress. No distress.  HENT:  Head: Normocephalic and atraumatic.  Eyes: Conjunctivae and EOM are normal. Pupils are equal, round, and reactive to light. No scleral icterus.  Neck: Normal range of motion. Neck supple. Carotid bruit is not present. No thyromegaly present.  Cardiovascular: Normal rate,  regular rhythm, normal heart sounds and intact distal pulses.  Exam reveals no gallop and no friction rub.   No murmur heard. Pulmonary/Chest: Effort normal and breath sounds normal. No respiratory distress. She has no wheezes. She has no rales.  Musculoskeletal: She exhibits no edema.  Multiple trigger point pain everywhere touched.  Worse in R. Shoulder an d arm areas.  Lymphadenopathy:    She has no cervical adenopathy.  Neurological: She is alert and oriented to person, place, and time.  Vitals reviewed.     No results found for this or any previous visit (from the past 2160 hour(s)).   Assessment & Plan  1. Fibromyalgia  - DULoxetine (CYMBALTA) 30 MG capsule; Take 1 capsule (30 mg total) by mouth daily. Start after being off Prozac for 10 days.  Dispense: 30 capsule; Refill: 6  2. Panlobular emphysema -see Dr. Lake Bells next week in [place]   3. Muscular deconditioning   4. Need for influenza vaccination  - Flu vaccine HIGH DOSE PF (Fluzone High dose)

## 2014-10-21 ENCOUNTER — Telehealth: Payer: Self-pay | Admitting: *Deleted

## 2014-10-21 ENCOUNTER — Other Ambulatory Visit: Payer: Self-pay | Admitting: *Deleted

## 2014-10-21 DIAGNOSIS — M797 Fibromyalgia: Secondary | ICD-10-CM

## 2014-10-21 MED ORDER — DULOXETINE HCL 30 MG PO CPEP: 30.0000 mg | ORAL_CAPSULE | Freq: Every day | ORAL | Status: DC

## 2014-10-21 NOTE — Telephone Encounter (Signed)
Patient mail order pharmacy called and stated Cymbalta needed to be sent to retail pharmacy. They do not fill. [phone] This was resubmitted to [place]. Patient notified.

## 2014-10-23 ENCOUNTER — Other Ambulatory Visit (INDEPENDENT_AMBULATORY_CARE_PROVIDER_SITE_OTHER): Payer: Medicare Other

## 2014-10-23 ENCOUNTER — Other Ambulatory Visit: Payer: Self-pay | Admitting: Family Medicine

## 2014-10-23 ENCOUNTER — Encounter: Payer: Self-pay | Admitting: Pulmonary Disease

## 2014-10-23 ENCOUNTER — Ambulatory Visit (INDEPENDENT_AMBULATORY_CARE_PROVIDER_SITE_OTHER): Payer: Medicare Other | Admitting: Pulmonary Disease

## 2014-10-23 VITALS — BP 140/76 | HR 68 | Ht 59.0 in | Wt 135.4 lb

## 2014-10-23 DIAGNOSIS — J431 Panlobular emphysema: Secondary | ICD-10-CM | POA: Diagnosis not present

## 2014-10-23 DIAGNOSIS — J9611 Chronic respiratory failure with hypoxia: Secondary | ICD-10-CM

## 2014-10-23 DIAGNOSIS — J8410 Pulmonary fibrosis, unspecified: Secondary | ICD-10-CM | POA: Diagnosis not present

## 2014-10-23 DIAGNOSIS — Z23 Encounter for immunization: Secondary | ICD-10-CM

## 2014-10-23 DIAGNOSIS — R0609 Other forms of dyspnea: Secondary | ICD-10-CM | POA: Diagnosis not present

## 2014-10-23 DIAGNOSIS — Z5181 Encounter for therapeutic drug level monitoring: Secondary | ICD-10-CM

## 2014-10-23 LAB — HEPATIC FUNCTION PANEL
ALT: 9 U/L (ref 0–35)
AST: 18 U/L (ref 0–37)
Albumin: 4 g/dL (ref 3.5–5.2)
Alkaline Phosphatase: 163 U/L — ABNORMAL HIGH (ref 39–117)
BILIRUBIN DIRECT: 0.1 mg/dL (ref 0.0–0.3)
BILIRUBIN TOTAL: 0.3 mg/dL (ref 0.2–1.2)
Total Protein: 6.9 g/dL (ref 6.0–8.3)

## 2014-10-23 MED ORDER — AMITRIPTYLINE HCL 10 MG PO TABS: ORAL_TABLET | ORAL | Status: DC

## 2014-10-23 NOTE — Patient Instructions (Signed)
Try the Anoro 1 puff daily Namenda high you feel, call me after using it to let me know if it helped you breathe better We will call you with the results of the bloodwork Exercise regularly, no matter how you feel Keep taking the Esbriet We will arrange a pulmonary function test in [place] We will see you back in 3 months or sooner if needed

## 2014-10-23 NOTE — Progress Notes (Signed)
Subjective:    Patient ID: Kathryn Ruiz, female    DOB: 1941/01/07, 74 y.o.   MRN: [id number]  Synopsis: Kathryn Ruiz was first followed by Dr. Lamonte Sakai in the winter of 2013 the [place] office and then came to the [facility] office in June 2013 for evaluation of COPD and pulmonary fibrosis. She smoked 3 packs a day for 50 years and quit in 2001. CT chest has shown nonspecific fibrotic changes in the bases as well as a significant amount of emphysema throughout her lungs. She has a history of esophageal dysphagia requiring balloon dilation on multiple times in the past. By 2015 her shortness of breath had progressed with a decreased total lung capacity to get the nonspecific fibrotic changes on her CT had not changed. She was started on Ofev in 2015 but was unable to tolerated do to significant nausea and vomiting. She was evaluated by the [facility] lung transplant clinic and was told that she was not a good candidate due to her esophageal dysmotility.  HPI Chief Complaint  Patient presents with  . Follow-up    Pt c/o worsening SOB, back pain, wheezing, and productive cough with green mucus. Pt states thaat financial assistance was approved esbriet.    Kathryn Ruiz still has a lot of dyspnea.  She has been experiencing a lot of aches and pains in her back and shoulder and leg lately.   She saw her PCP last week and starts Symbalta. She feels that her breathing is worsening.  She wakes up several times in the night dyspneic. She doesn't have too much cough lately Her husband says she wheezes more. She hasn't been taking her inhalers because her mouth is too sore.  She is taking [facility] mouthwash for this.   She has tongue soreness a lot and a lot of dry mouth. She continues to use Esbriet and morphine as needed for dyspnea.   She says that she can't eat much due to the mouth soreness.  She feels that pills or food will get stuck.     Past Medical History  Diagnosis Date  . Fibromyalgia   . GERD  (gastroesophageal reflux disease)   . CHF (congestive heart failure)   . Esophageal stricture   . Depression   . H/O hiatal hernia   . Oxygen dependent     07-05-13 oxygen 24/7 -3 l/m daytime, 4 l/m nighttime nasally.  Marland Kitchen COPD (chronic obstructive pulmonary disease)     Dr. Lake Bells -[facility]     Review of Systems  Constitutional: Negative for fever, chills and unexpected weight change.  HENT: Negative for congestion, nosebleeds, postnasal drip, rhinorrhea and sneezing.   Respiratory: Positive for shortness of breath. Negative for cough and choking.   Cardiovascular: Negative for chest pain and leg swelling.       Objective:   Physical Exam  Filed Vitals:   10/23/14 1337  BP: 140/76  Pulse: 68  Height: _0  (1.499 m)  Weight: 135 lb 6.4 oz (61.417 kg)  SpO2: 94%   4 L nasal cannula  Gen: chronically ill appearing, no acute distress HEENT: NCAT, OP clear, nasopharynx clear PULM:  No wheezing today, crackles in bases noted CV: RRR, systolic murmur noted, no JVD AB: BS+, soft, nontender Ext: Left leg excoriated rash improved since the last exam  150 pack year smoking history, quit 2001 05/2011 PFT [facility] >> Ratio 61%, FEV1 1.43 L 91% pred TLC 4.05 L 106% pred DLCO 41% pred 06/2011 Overnight oximetry >> 96% of  the time her O2 saturation was between 80 and 90% (94% under 88%) 06/2011 CT Chest [facility]>> marked emphysema and basilar fibrosis  08/2011 started on 3L O2 with exertion 08/2011 6MW >> 750 ft., O2 saturation 79% on exercise;  09/2011 MMRC 3 11/2011 Pulm rehab [facility] 01/2012 6MW 1010 ft, O2 sat 90% nadir 3L 05/2012 IgE > normal 05/2012 sputum AFB >> 05/2012 sputum fungal >> c. Albicans 07/2012 CT chest >> bilateral severe emphysema; question of honeycombing and interstitial thickening in the bases, not clearly UIP: McQuaid read> no significant progression since the 06/2011 study 12/2012 PFT> ratio 65%, FEV1 1.29 L (85% pred), no change with BD, TLC 3.52L (93% pred), DLCO  6.2 (36% pred) 12/2012 ILD serology panel negative\ February 2015 barium swallow> mild narrowing of the lower cervical esophagus which results in transient obstruction of the 12 mm barium pill, high grade narrowing of the distal esophagus just proximal to a moderate sized partially reducible hiatal hernia. Maximal measured luminal diameter is 4 mm. 12 mm barium pill would not pass. 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 02/2013 Echo> Normal LVEF, mild LVH and diastolic dysfunction; normal RV and RVSP 03/2013 rejected for consideration for lung transplant [facility] (dysphagia) June 2016 lab work reviewed, mildly elevated Alk Phos     Assessment & Plan:   Dyspnea on exertion She continues to struggle with severe dyspnea. I think in large part this is due to the fact that she stopped all COPD treatment because of mouth sores. Today she does not have any sores on exam. I explained to her at length that she has advanced emphysema which is much more severe than her scarring in her lungs.  I'm going to give her a trial of Anoro to treat her advanced COPD and hopefully this won't cause any mouth sores Check a pulmonary function test to try to see if there has been progression of the pulmonary fibrosis (follow DLCO and total lung capacity) Ambulatory oximetry today, make sure that her current O2 administration is appropriate  COPD (chronic obstructive pulmonary disease) As above, I'm disappointed that she has stopped using her inhaled therapy. I explained to her at length today the significance of this decision and the fact that she has severe emphysema first, pulmonary fibrosis second.  Plan Trial of Anoro as noted Flu shot is up-to-date Prevnar vaccine today  Pulmonary fibrosis Continue Esbriet Check LFTs for therapeutic drug monitoring  Chronic hypoxemic respiratory failure Ambulatory oximetry on 4 L nasal cannula today to ensure that that is adequate to maintain O2 saturation greater than 88%  while ambulatory.    Updated Medication List Outpatient Encounter Prescriptions as of 10/23/2014  Medication Sig  . diphenhydrAMINE (BENADRYL) 25 MG tablet Take 50 mg by mouth at bedtime as needed for sleep.  . fluticasone (VERAMYST) 27.5 MCG/SPRAY nasal spray Place 2 sprays into the nose daily.  . furosemide (LASIX) 20 MG tablet Take 20 mg by mouth every morning.   Marland Kitchen guaiFENesin-codeine (CHERATUSSIN AC) 100-10 MG/5ML syrup Take 5 mLs by mouth 3 (three) times daily as needed for cough.  . morphine 20 MG/5ML solution Take 0.6 mLs (2.4 mg total) by mouth every 4 (four) hours as needed (shortness of breath).  . pantoprazole (PROTONIX) 40 MG tablet Take 1 tablet (40 mg total) by mouth daily.  . Pirfenidone 267 MG CAPS Take 3 capsules by mouth 3 (three) times daily.  . potassium chloride (KLOR-CON) 20 MEQ packet Take 20 mEq by mouth daily.  Marland Kitchen amitriptyline (ELAVIL) 10 MG  tablet Take three tablets every day. (Patient not taking: Reported on 10/23/2014)  . budesonide (PULMICORT) 0.5 MG/2ML nebulizer solution Take 2 mLs (0.5 mg total) by nebulization 2 (two) times daily. (Patient not taking: Reported on 10/23/2014)  . DULoxetine (CYMBALTA) 30 MG capsule Take 1 capsule (30 mg total) by mouth daily. Start after being off Prozac for 10 days. (Patient not taking: Reported on 10/23/2014)  . formoterol (PERFOROMIST) 20 MCG/2ML nebulizer solution Take 20 mcg by nebulization 2 (two) times daily.  Marland Kitchen zolpidem (AMBIEN CR) 6.25 MG CR tablet Take 1 tablet (6.25 mg total) by mouth at bedtime as needed for sleep. (Patient not taking: Reported on 10/18/2014)   No facility-administered encounter medications on file as of 10/23/2014.

## 2014-10-23 NOTE — Assessment & Plan Note (Signed)
As above, I'm disappointed that she has stopped using her inhaled therapy. I explained to her at length today the significance of this decision and the fact that she has severe emphysema first, pulmonary fibrosis second.  Plan Trial of Anoro as noted Flu shot is up-to-date Prevnar vaccine today

## 2014-10-23 NOTE — Assessment & Plan Note (Signed)
Ambulatory oximetry on 4 L nasal cannula today to ensure that that is adequate to maintain O2 saturation greater than 88% while ambulatory.

## 2014-10-23 NOTE — Addendum Note (Signed)
Addended by: Levander Campion on: 10/23/2014 02:06 PM   Modules accepted: Orders

## 2014-10-23 NOTE — Assessment & Plan Note (Signed)
Continue Esbriet Check LFTs for therapeutic drug monitoring

## 2014-10-23 NOTE — Assessment & Plan Note (Signed)
She continues to struggle with severe dyspnea. I think in large part this is due to the fact that she stopped all COPD treatment because of mouth sores. Today she does not have any sores on exam. I explained to her at length that she has advanced emphysema which is much more severe than her scarring in her lungs.  I'm going to give her a trial of Anoro to treat her advanced COPD and hopefully this won't cause any mouth sores Check a pulmonary function test to try to see if there has been progression of the pulmonary fibrosis (follow DLCO and total lung capacity) Ambulatory oximetry today, make sure that her current O2 administration is appropriate

## 2014-10-30 ENCOUNTER — Telehealth: Payer: Self-pay | Admitting: Pulmonary Disease

## 2014-10-30 MED ORDER — UMECLIDINIUM-VILANTEROL 62.5-25 MCG/INH IN AEPB: 1.0000 | INHALATION_SPRAY | Freq: Every day | RESPIRATORY_TRACT | Status: DC

## 2014-10-30 NOTE — Telephone Encounter (Signed)
Spoke with pt. States that BQ gave her a sample of Anoro, it seems to be working well. Would like a rx sent in. This has been done. Nothing further was needed.

## 2014-11-07 ENCOUNTER — Ambulatory Visit (INDEPENDENT_AMBULATORY_CARE_PROVIDER_SITE_OTHER): Payer: Medicare Other | Admitting: Pulmonary Disease

## 2014-11-07 DIAGNOSIS — J8410 Pulmonary fibrosis, unspecified: Secondary | ICD-10-CM | POA: Diagnosis not present

## 2014-11-07 LAB — PULMONARY FUNCTION TEST
DL/VA % pred: 43 %
DL/VA: 1.77 ml/min/mmHg/L
DLCO unc % pred: 44 %
DLCO unc: 7.85 ml/min/mmHg
FEF 25-75 Post: 0.89 L/sec
FEF 25-75 Pre: 0.86 L/sec
FEF2575-%Change-Post: 3 %
FEF2575-%Pred-Post: 62 %
FEF2575-%Pred-Pre: 60 %
FEV1-%Change-Post: 2 %
FEV1-%Pred-Post: 89 %
FEV1-%Pred-Pre: 86 %
FEV1-Post: 1.5 L
FEV1-Pre: 1.46 L
FEV1FVC-%Change-Post: 0 %
FEV1FVC-%Pred-Pre: 91 %
FEV6-%Change-Post: 1 %
FEV6-%Pred-Post: 101 %
FEV6-%Pred-Pre: 100 %
FEV6-Post: 2.17 L
FEV6-Pre: 2.14 L
FEV6FVC-%Change-Post: -1 %
FEV6FVC-%Pred-Post: 103 %
FEV6FVC-%Pred-Pre: 105 %
FVC-%Change-Post: 2 %
FVC-%Pred-Post: 97 %
FVC-%Pred-Pre: 94 %
FVC-Post: 2.2 L
FVC-Pre: 2.14 L
Post FEV1/FVC ratio: 68 %
Post FEV6/FVC ratio: 99 %
Pre FEV1/FVC ratio: 68 %
Pre FEV6/FVC Ratio: 100 %

## 2014-11-07 NOTE — Progress Notes (Signed)
PFT performed today with Nitrogen washout. 

## 2014-11-25 ENCOUNTER — Ambulatory Visit (INDEPENDENT_AMBULATORY_CARE_PROVIDER_SITE_OTHER): Payer: Medicare Other | Admitting: Family Medicine

## 2014-11-25 ENCOUNTER — Encounter: Payer: Self-pay | Admitting: Family Medicine

## 2014-11-25 VITALS — BP 150/65 | HR 80 | Temp 98.0°F | Resp 18 | Ht <= 58 in | Wt 138.0 lb

## 2014-11-25 DIAGNOSIS — R1319 Other dysphagia: Secondary | ICD-10-CM

## 2014-11-25 DIAGNOSIS — J431 Panlobular emphysema: Secondary | ICD-10-CM | POA: Diagnosis not present

## 2014-11-25 DIAGNOSIS — R1314 Dysphagia, pharyngoesophageal phase: Secondary | ICD-10-CM | POA: Diagnosis not present

## 2014-11-25 DIAGNOSIS — R1310 Dysphagia, unspecified: Secondary | ICD-10-CM

## 2014-11-25 DIAGNOSIS — I10 Essential (primary) hypertension: Secondary | ICD-10-CM

## 2014-11-25 DIAGNOSIS — M797 Fibromyalgia: Secondary | ICD-10-CM

## 2014-11-25 MED ORDER — DULOXETINE HCL 30 MG PO CPEP: ORAL_CAPSULE | ORAL | Status: DC

## 2014-11-25 MED ORDER — LISINOPRIL 10 MG PO TABS: 10.0000 mg | ORAL_TABLET | Freq: Every day | ORAL | Status: DC

## 2014-11-25 NOTE — Progress Notes (Signed)
Name: Kathryn Ruiz   MRN: [id number]    DOB: Jan 21, 1941   Date:11/25/2014       Progress Note  Subjective  Chief Complaint  Chief Complaint  Patient presents with  . Shoulder Pain    months    HPI  Here for f/u of fibromyalgia.  Still with considerable pain, esp in her R shoulder and arm.  Not noticed much improvement with Cymbalta yet.   No problem-specific assessment & plan notes found for this encounter.   Past Medical History  Diagnosis Date  . Fibromyalgia   . GERD (gastroesophageal reflux disease)   . CHF (congestive heart failure) ([place])   . Esophageal stricture   . Depression   . H/O hiatal hernia   . Oxygen dependent     07-05-13 oxygen 24/7 -3 l/m daytime, 4 l/m nighttime nasally.  Marland Kitchen COPD (chronic obstructive pulmonary disease) (HCC)     Dr. Lake Bells -[facility]    Social History  Substance Use Topics  . Smoking status: Former Smoker -- 3.00 packs/day for 50 years    Types: Cigarettes    Quit date: 01/26/1999  . Smokeless tobacco: Never Used  . Alcohol Use: No     Current outpatient prescriptions:  .  amitriptyline (ELAVIL) 10 MG tablet, Take three tablets every day., Disp: 90 tablet, Rfl: 3 .  diphenhydrAMINE (BENADRYL) 25 MG tablet, Take 50 mg by mouth at bedtime as needed for sleep., Disp: , Rfl:  .  DULoxetine (CYMBALTA) 30 MG capsule, Take 1 capsule (30 mg total) by mouth daily. Start after being off Prozac for 10 days., Disp: 30 capsule, Rfl: 6 .  fluticasone (VERAMYST) 27.5 MCG/SPRAY nasal spray, Place 2 sprays into the nose daily., Disp: , Rfl:  .  furosemide (LASIX) 20 MG tablet, Take 20 mg by mouth every morning. , Disp: , Rfl:  .  guaiFENesin-codeine (CHERATUSSIN AC) 100-10 MG/5ML syrup, Take 5 mLs by mouth 3 (three) times daily as needed for cough., Disp: 120 mL, Rfl: 0 .  morphine 20 MG/5ML solution, Take 0.6 mLs (2.4 mg total) by mouth every 4 (four) hours as needed (shortness of breath)., Disp: 250 mL, Rfl: 0 .  pantoprazole  (PROTONIX) 40 MG tablet, Take 1 tablet (40 mg total) by mouth daily., Disp: 90 tablet, Rfl: 3 .  Pirfenidone 267 MG CAPS, Take 3 capsules by mouth 3 (three) times daily., Disp: 270 capsule, Rfl: 11 .  potassium chloride (KLOR-CON) 20 MEQ packet, Take 20 mEq by mouth daily., Disp: , Rfl:  .  Umeclidinium-Vilanterol (ANORO ELLIPTA) 62.5-25 MCG/INH AEPB, Inhale 1 puff into the lungs daily., Disp: 60 each, Rfl: 5 .  zolpidem (AMBIEN CR) 6.25 MG CR tablet, Take 1 tablet (6.25 mg total) by mouth at bedtime as needed for sleep., Disp: 15 tablet, Rfl: 0  No Known Allergies  Review of Systems  Constitutional: Positive for malaise/fatigue. Negative for fever, chills and weight loss.  HENT: Negative for hearing loss.   Eyes: Negative for blurred vision and double vision.  Respiratory: Positive for cough, sputum production, shortness of breath and wheezing.   Cardiovascular: Negative for chest pain, palpitations and leg swelling.  Gastrointestinal: Negative for heartburn, abdominal pain and blood in stool.  Genitourinary: Negative for dysuria, urgency and frequency.  Musculoskeletal: Positive for myalgias and joint pain.  Neurological: Negative for dizziness, tremors, weakness and headaches.      Objective  Filed Vitals:   11/25/14 1005  BP: 154/76  Pulse: 80  Temp: 98 F (36.7  C)  TempSrc: Oral  Resp: 18  Height: 4\' 10"  (1.473 m)  Weight: 138 lb (62.596 kg)     Physical Exam  Constitutional: She is well-developed, well-nourished, and in no distress. No distress.  HENT:  Head: Normocephalic and atraumatic.  Eyes: Conjunctivae and EOM are normal. Pupils are equal, round, and reactive to light. No scleral icterus.  Neck: Normal range of motion. Neck supple. Carotid bruit is not present. No thyromegaly present.  Cardiovascular: Normal rate, regular rhythm, normal heart sounds and intact distal pulses.  Exam reveals no gallop and no friction rub.   No murmur heard. Pulmonary/Chest: No  respiratory distress. She has decreased breath sounds in the right upper field, the right middle field, the right lower field, the left upper field, the left middle field and the left lower field. She has no wheezes. She has no rales.  Musculoskeletal: She exhibits no edema.  Tender wherever touched in R arm, shoulder, R neck , back or ant. Chest.  Lymphadenopathy:    She has no cervical adenopathy.  Vitals reviewed.     Recent Results (from the past 2160 hour(s))  Hepatic function panel     Status: Abnormal   Collection Time: 10/23/14  2:23 PM  Result Value Ref Range   Total Bilirubin 0.3 0.2 - 1.2 mg/dL   Bilirubin, Direct 0.1 0.0 - 0.3 mg/dL   Alkaline Phosphatase 163 (H) 39 - 117 U/L   AST 18 0 - 37 U/L   ALT 9 0 - 35 U/L   Total Protein 6.9 6.0 - 8.3 g/dL   Albumin 4.0 3.5 - 5.2 g/dL  Pulmonary Function Test     Status: None   Collection Time: 11/07/14  1:42 PM  Result Value Ref Range   FVC-Pre 2.14 L   FVC-%Pred-Pre 94 %   FVC-Post 2.20 L   FVC-%Pred-Post 97 %   FVC-%Change-Post 2 %   FEV1-Pre 1.46 L   FEV1-%Pred-Pre 86 %   FEV1-Post 1.50 L   FEV1-%Pred-Post 89 %   FEV1-%Change-Post 2 %   FEV6-Pre 2.14 L   FEV6-%Pred-Pre 100 %   FEV6-Post 2.17 L   FEV6-%Pred-Post 101 %   FEV6-%Change-Post 1 %   Pre FEV1/FVC ratio 68 %   FEV1FVC-%Pred-Pre 91 %   Post FEV1/FVC ratio 68 %   FEV1FVC-%Change-Post 0 %   Pre FEV6/FVC Ratio 100 %   FEV6FVC-%Pred-Pre 105 %   Post FEV6/FVC ratio 99 %   FEV6FVC-%Pred-Post 103 %   FEV6FVC-%Change-Post -1 %   FEF 25-75 Pre 0.86 L/sec   FEF2575-%Pred-Pre 60 %   FEF 25-75 Post 0.89 L/sec   FEF2575-%Pred-Post 62 %   FEF2575-%Change-Post 3 %   DLCO unc 7.85 ml/min/mmHg   DLCO unc % pred 44 %   DL/VA 1.77 ml/min/mmHg/L   DL/VA % pred 43 %     Assessment & Plan  1. Fibromyalgia  - DULoxetine (CYMBALTA) 30 MG capsule; Take 2 capsules daily  Dispense: 60 capsule; Refill: 6  2. Panlobular emphysema ([place])   3. Esophageal  dysphagia   4. Essential hypertension  - lisinopril (PRINIVIL,ZESTRIL) 10 MG tablet; Take 1 tablet (10 mg total) by mouth daily.  Dispense: 30 tablet; Refill: 6

## 2014-11-25 NOTE — Patient Instructions (Signed)
May take Tylenol for extra pain in R. Arm.

## 2014-12-13 ENCOUNTER — Other Ambulatory Visit: Payer: Self-pay | Admitting: Family Medicine

## 2014-12-23 ENCOUNTER — Telehealth: Payer: Self-pay | Admitting: Pulmonary Disease

## 2014-12-23 ENCOUNTER — Ambulatory Visit: Payer: Self-pay | Admitting: Family Medicine

## 2014-12-23 IMAGING — CR DG CHEST 2V
1 series · 2 of 2 positions shown · non-contrast
Comparison: none

REASON FOR EXAM: cough
COMMENTS:

[Series 1: pa · 0.17mm/px · 2 of 2 slices shown]
[im 1/2]
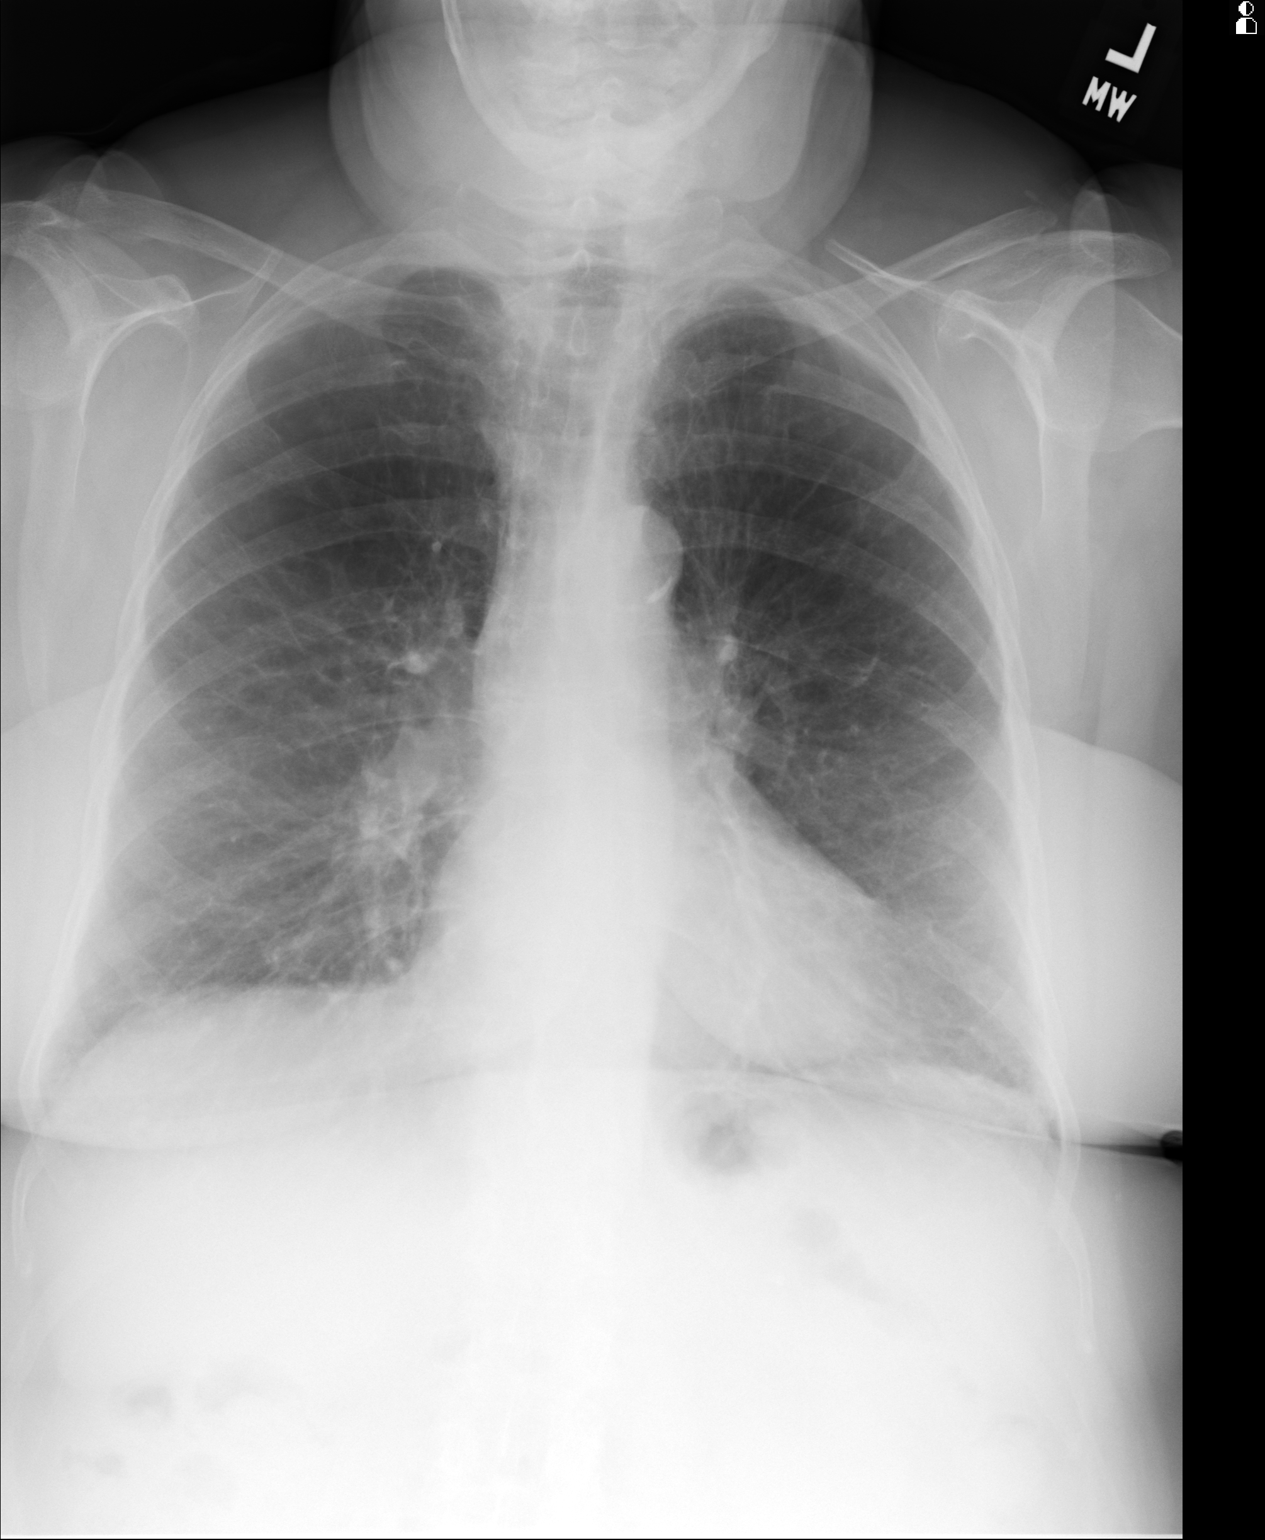
[im 2/2]
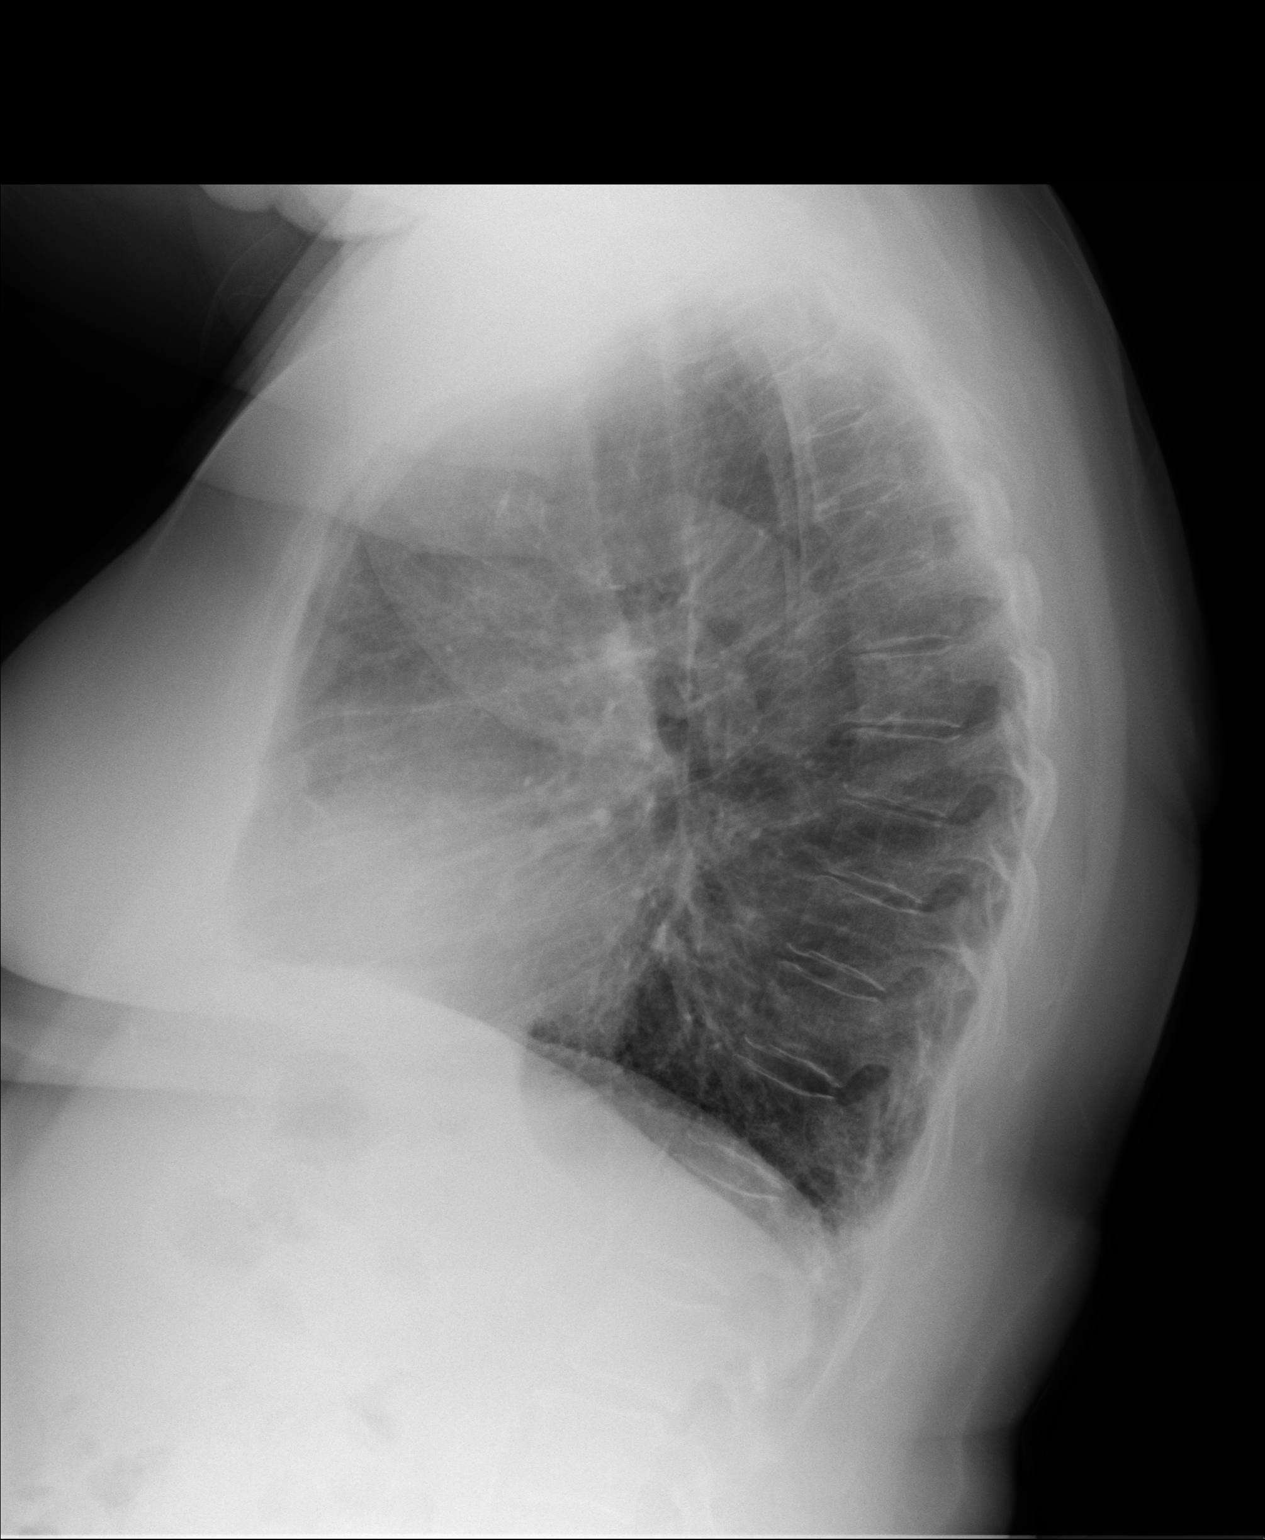

[2 of 2 positions shown; findings below may reference images not displayed]

PROCEDURE:     DEGRAFT - DEGRAFT CHEST PA (OR AP) AND LAT  - May 02, 2012 [DATE]

RESULT:     The lungs are mildly hyperinflated. The interstitial markings
are increased but this is not a new finding. The cardiac silhouette is
normal in size. The mediastinum is normal in width. There is no pleural
effusion or pneumothorax or alveolar infiltrate.
IMPRESSION: The findings suggest COPD. The increased interstitial
markings may reflect subsegmental atelectasis as might be seen with acute
bronchitis. These findings are not entirely new but they are more
conspicuous than in October 2011.

[REDACTED]

## 2014-12-23 NOTE — Telephone Encounter (Signed)
Called, spoke with pt - she is requesting morphine 20mg /63mL rx and would like to have this picked up tomorrow evening.  She is aware will first get BQ approval and will let her know when rx is ready.   Morphine 20mg /46mL rx last printed on 10/16/14 for 250 mL Take 0.6 mLs (2.4 mg total) by mouth every 4 (four) hours as needed (shortness of breath. No refills given.   Pt's last OV with BQ 10/23/14 with recs to f/u in 3 months. Pt has a pending OV with BQ on 01/24/15. Dr. Lake Bells, please advise if ok to refill.  Thank you.

## 2014-12-24 MED ORDER — MORPHINE SULFATE 20 MG/5ML PO SOLN: 2.5000 mg | ORAL | Status: DC | PRN

## 2014-12-24 NOTE — Telephone Encounter (Signed)
RX printed and signed by BQ  Called pt and notified that rx is ready to be picked up Rx placed up front for pick up  Nothing further is needed

## 2014-12-24 NOTE — Telephone Encounter (Signed)
OK by me to refill 

## 2015-01-21 ENCOUNTER — Ambulatory Visit: Payer: Medicare Other | Admitting: Family Medicine

## 2015-01-21 ENCOUNTER — Ambulatory Visit (INDEPENDENT_AMBULATORY_CARE_PROVIDER_SITE_OTHER): Payer: Medicare Other | Admitting: Family Medicine

## 2015-01-21 ENCOUNTER — Encounter: Payer: Self-pay | Admitting: Family Medicine

## 2015-01-21 VITALS — BP 126/63 | HR 80 | Temp 98.6°F | Resp 16 | Ht <= 58 in | Wt 132.0 lb

## 2015-01-21 DIAGNOSIS — R1314 Dysphagia, pharyngoesophageal phase: Secondary | ICD-10-CM | POA: Diagnosis not present

## 2015-01-21 DIAGNOSIS — I10 Essential (primary) hypertension: Secondary | ICD-10-CM | POA: Diagnosis not present

## 2015-01-21 DIAGNOSIS — K5900 Constipation, unspecified: Secondary | ICD-10-CM | POA: Diagnosis not present

## 2015-01-21 DIAGNOSIS — M797 Fibromyalgia: Secondary | ICD-10-CM

## 2015-01-21 DIAGNOSIS — J8410 Pulmonary fibrosis, unspecified: Secondary | ICD-10-CM

## 2015-01-21 DIAGNOSIS — R1319 Other dysphagia: Secondary | ICD-10-CM

## 2015-01-21 DIAGNOSIS — R1310 Dysphagia, unspecified: Secondary | ICD-10-CM

## 2015-01-21 DIAGNOSIS — J431 Panlobular emphysema: Secondary | ICD-10-CM | POA: Diagnosis not present

## 2015-01-21 NOTE — Progress Notes (Signed)
Name: Kathryn Ruiz   MRN: [id number]    DOB: Dec 11, 1940   Date:01/21/2015       Progress Note  Subjective  Chief Complaint  Chief Complaint  Patient presents with  . Fibromyalgia    HPI Here for f/u of HBP and fibromyalgia.  Has COPD (severe).  Sees Dr. Lake Bells for breathing.   Breathing is doping not so we..  Sees Dr. Lake Bells in 3 days No problem-specific assessment & plan notes found for this encounter.   Past Medical History  Diagnosis Date  . Fibromyalgia   . GERD (gastroesophageal reflux disease)   . CHF (congestive heart failure) ([place])   . Esophageal stricture   . Depression   . H/O hiatal hernia   . Oxygen dependent     07-05-13 oxygen 24/7 -3 l/m daytime, 4 l/m nighttime nasally.  Marland Kitchen COPD (chronic obstructive pulmonary disease) ([facility])     Dr. Lake Bells -[facility]    Past Surgical History  Procedure Laterality Date  . Sinus surgery  1999  . Appendectomy  1954  . Cataract extraction  1998    both  . Neuroma surgery  2001  . Foot irrigation  2002  . Disectomy  2003  . Vocal cord biopsy  2005  . Shoulder surgery Left 2010  . Varicose vein surgery Bilateral   . Esophagogastroduodenoscopy (egd) with propofol N/A 07/17/2013    Procedure: ESOPHAGOGASTRODUODENOSCOPY (EGD) WITH PROPOFOL;  Surgeon: Jerene Bears, MD;  Location: [facility];  Service: Gastroenterology;  Laterality: N/A;    Family History  Problem Relation Age of Onset  . Heart attack Father 20  . Heart disease Mother     Social History   Social History  . Marital Status: Married    Spouse Name: N/A  . Number of Children: 5  . Years of Education: N/A   Occupational History  . Retired     Water engineer   Social History Main Topics  . Smoking status: Former Smoker -- 3.00 packs/day for 50 years    Types: Cigarettes    Quit date: 01/26/1999  . Smokeless tobacco: Never Used  . Alcohol Use: No  . Drug Use: No  . Sexual Activity: Not on file   Other Topics Concern  . Not on file    Social History Narrative     Current outpatient prescriptions:  .  amitriptyline (ELAVIL) 10 MG tablet, Take three tablets every day., Disp: 90 tablet, Rfl: 3 .  diphenhydrAMINE (BENADRYL) 25 MG tablet, Take 50 mg by mouth at bedtime as needed for sleep., Disp: , Rfl:  .  DULoxetine (CYMBALTA) 30 MG capsule, Take 2 capsules daily, Disp: 60 capsule, Rfl: 6 .  fluticasone (VERAMYST) 27.5 MCG/SPRAY nasal spray, Place 2 sprays into the nose daily., Disp: , Rfl:  .  furosemide (LASIX) 20 MG tablet, TAKE ONE (1) TABLET EACH DAY, Disp: 90 tablet, Rfl: 3 .  guaiFENesin-codeine (CHERATUSSIN AC) 100-10 MG/5ML syrup, Take 5 mLs by mouth 3 (three) times daily as needed for cough., Disp: 120 mL, Rfl: 0 .  lisinopril (PRINIVIL,ZESTRIL) 10 MG tablet, Take 1 tablet (10 mg total) by mouth daily., Disp: 30 tablet, Rfl: 6 .  morphine 20 MG/5ML solution, Take 0.6 mLs (2.4 mg total) by mouth every 4 (four) hours as needed (shortness of breath)., Disp: 250 mL, Rfl: 0 .  pantoprazole (PROTONIX) 40 MG tablet, Take 1 tablet (40 mg total) by mouth daily., Disp: 90 tablet, Rfl: 3 .  Pirfenidone 267 MG CAPS, Take 3  capsules by mouth 3 (three) times daily., Disp: 270 capsule, Rfl: 11 .  potassium chloride (KLOR-CON) 20 MEQ packet, Take 20 mEq by mouth daily., Disp: , Rfl:  .  Umeclidinium-Vilanterol (ANORO ELLIPTA) 62.5-25 MCG/INH AEPB, Inhale 1 puff into the lungs daily., Disp: 60 each, Rfl: 5 .  zolpidem (AMBIEN CR) 6.25 MG CR tablet, Take 1 tablet (6.25 mg total) by mouth at bedtime as needed for sleep. (Patient not taking: Reported on 01/21/2015), Disp: 15 tablet, Rfl: 0  Not on File   Review of Systems  Constitutional: Positive for weight loss. Negative for chills and malaise/fatigue.  HENT: Negative for hearing loss.   Eyes: Negative for blurred vision and double vision.  Respiratory: Positive for cough, sputum production, shortness of breath and wheezing.   Cardiovascular: Negative for chest pain,  palpitations and leg swelling.  Gastrointestinal: Positive for constipation. Negative for heartburn, abdominal pain and blood in stool.  Genitourinary: Negative for dysuria, urgency and frequency.  Musculoskeletal: Positive for myalgias.  Skin: Negative for rash.  Neurological: Negative for dizziness, tremors, weakness and headaches.  Psychiatric/Behavioral: Positive for depression.      Objective  Filed Vitals:   01/21/15 1035  BP: 126/63  Pulse: 80  Temp: 98.6 F (37 C)  TempSrc: Oral  Resp: 16  Height: 4\' 10"  (1.473 m)  Weight: 132 lb (59.875 kg)    Physical Exam  Constitutional: She is oriented to person, place, and time and well-developed, well-nourished, and in no distress. No distress.  HENT:  Head: Normocephalic and atraumatic.  Neck: Normal range of motion. Neck supple. Carotid bruit is not present. No thyromegaly present.  Cardiovascular: Normal rate, regular rhythm and normal heart sounds.  Exam reveals no gallop and no friction rub.   No murmur heard. Pulmonary/Chest: Effort normal. No respiratory distress. She has no wheezes. She has no rales.  Deceased breath sounds throughout.  Musculoskeletal: She exhibits no edema.  Lymphadenopathy:    She has no cervical adenopathy.  Neurological: She is alert and oriented to person, place, and time.  Vitals reviewed.      Recent Results (from the past 2160 hour(s))  Hepatic function panel     Status: Abnormal   Collection Time: 10/23/14  2:23 PM  Result Value Ref Range   Total Bilirubin 0.3 0.2 - 1.2 mg/dL   Bilirubin, Direct 0.1 0.0 - 0.3 mg/dL   Alkaline Phosphatase 163 (H) 39 - 117 U/L   AST 18 0 - 37 U/L   ALT 9 0 - 35 U/L   Total Protein 6.9 6.0 - 8.3 g/dL   Albumin 4.0 3.5 - 5.2 g/dL  Pulmonary Function Test     Status: None   Collection Time: 11/07/14  1:42 PM  Result Value Ref Range   FVC-Pre 2.14 L   FVC-%Pred-Pre 94 %   FVC-Post 2.20 L   FVC-%Pred-Post 97 %   FVC-%Change-Post 2 %   FEV1-Pre  1.46 L   FEV1-%Pred-Pre 86 %   FEV1-Post 1.50 L   FEV1-%Pred-Post 89 %   FEV1-%Change-Post 2 %   FEV6-Pre 2.14 L   FEV6-%Pred-Pre 100 %   FEV6-Post 2.17 L   FEV6-%Pred-Post 101 %   FEV6-%Change-Post 1 %   Pre FEV1/FVC ratio 68 %   FEV1FVC-%Pred-Pre 91 %   Post FEV1/FVC ratio 68 %   FEV1FVC-%Change-Post 0 %   Pre FEV6/FVC Ratio 100 %   FEV6FVC-%Pred-Pre 105 %   Post FEV6/FVC ratio 99 %   FEV6FVC-%Pred-Post 103 %   FEV6FVC-%Change-Post -  1 %   FEF 25-75 Pre 0.86 L/sec   FEF2575-%Pred-Pre 60 %   FEF 25-75 Post 0.89 L/sec   FEF2575-%Pred-Post 62 %   FEF2575-%Change-Post 3 %   DLCO unc 7.85 ml/min/mmHg   DLCO unc % pred 44 %   DL/VA 1.77 ml/min/mmHg/L   DL/VA % pred 43 %     Assessment & Plan  Problem List Items Addressed This Visit      Cardiovascular and Mediastinum   HBP (high blood pressure) - Primary     Respiratory   COPD (chronic obstructive pulmonary disease) (HCC)   Pulmonary fibrosis (HCC)     Digestive   Esophageal dysphagia   Constipation     Musculoskeletal and Integument   Fibromyalgia      No orders of the defined types were placed in this encounter.   1. Essential hypertension -cont. Lisinopril  2. Panlobular emphysema (HCC)  -cont. meds and f/u with Dr. Otto Herb. Pulmonary fibrosis ([place])   4. Fibromyalgia  -cont. meds5. Constipation, unspecified constipation type   6. Esophageal dysphagia -cont. meds

## 2015-01-21 NOTE — Patient Instructions (Addendum)
Keep f/u with Dr. Lake Bells

## 2015-01-24 ENCOUNTER — Encounter: Payer: Self-pay | Admitting: Pulmonary Disease

## 2015-01-24 ENCOUNTER — Ambulatory Visit (INDEPENDENT_AMBULATORY_CARE_PROVIDER_SITE_OTHER): Payer: Medicare Other | Admitting: Pulmonary Disease

## 2015-01-24 ENCOUNTER — Other Ambulatory Visit (INDEPENDENT_AMBULATORY_CARE_PROVIDER_SITE_OTHER): Payer: Medicare Other

## 2015-01-24 VITALS — BP 120/64 | HR 83 | Ht 59.0 in | Wt 131.0 lb

## 2015-01-24 DIAGNOSIS — R630 Anorexia: Secondary | ICD-10-CM | POA: Diagnosis not present

## 2015-01-24 DIAGNOSIS — J8410 Pulmonary fibrosis, unspecified: Secondary | ICD-10-CM | POA: Diagnosis not present

## 2015-01-24 DIAGNOSIS — Z5181 Encounter for therapeutic drug level monitoring: Secondary | ICD-10-CM

## 2015-01-24 LAB — HEPATIC FUNCTION PANEL
ALBUMIN: 4 g/dL (ref 3.5–5.2)
ALK PHOS: 146 U/L — AB (ref 39–117)
ALT: 9 U/L (ref 0–35)
AST: 16 U/L (ref 0–37)
BILIRUBIN DIRECT: 0 mg/dL (ref 0.0–0.3)
BILIRUBIN TOTAL: 0.3 mg/dL (ref 0.2–1.2)
TOTAL PROTEIN: 7.1 g/dL (ref 6.0–8.3)

## 2015-01-24 MED ORDER — MORPHINE SULFATE 20 MG/5ML PO SOLN: 5.0000 mg | ORAL | Status: DC | PRN

## 2015-01-24 MED ORDER — MEGESTROL ACETATE 625 MG/5ML PO SUSP: 625.0000 mg | Freq: Every day | ORAL | Status: DC

## 2015-01-24 NOTE — Assessment & Plan Note (Signed)
She has a serious lack of appetite and has lost nearly 20 pounds in the last year. This goes along with the severity of her disease.  Kathryn Ruiz her family is requesting Megace. I think this is not unreasonable. Will prescribe daily dosing.

## 2015-01-24 NOTE — Progress Notes (Signed)
Subjective:    Patient ID: Kathryn Ruiz, female    DOB: 1940/09/13, 74 y.o.   MRN: [id number]  Synopsis: Kathryn Ruiz was first followed by Dr. Lamonte Sakai in the winter of 2013 the [place] office and then came to the [facility] office in June 2013 for evaluation of COPD and pulmonary fibrosis. She smoked 3 packs a day for 50 years and quit in 2001. CT chest has shown nonspecific fibrotic changes in the bases as well as a significant amount of emphysema throughout her lungs. She has a history of esophageal dysphagia requiring balloon dilation on multiple times in the past. By 2015 her shortness of breath had progressed with a decreased total lung capacity to get the nonspecific fibrotic changes on her CT had not changed. She was started on Ofev in 2015 but was unable to tolerated do to significant nausea and vomiting. She was evaluated by the [facility] lung transplant clinic and was told that she was not a good candidate due to her esophageal dysmotility.  She has an esophageal stricture, but GI as of 2016 says she can't have another endoscopy due to lung disease.   HPI Chief Complaint  Patient presents with  . Follow-up    pt c/o inceased fatigue, prod cough with green mucus X2 weeks.     [name] says she stays tired all the time.  She says that she has no appetite. There are few foods that actually appeal to her.  She tried some ham again.  Yesterday she only ate about three teaspoons of her dinner. She takes ensure daily to help keep her weight up.  She tries to drink 2 of these per day. She would like to try Megace. She has been coughing up green mucus in the last few weeks.  No sick contacts recently.  No fever.  Rare chest pain, only if she has some back pain.  She pain all over more and more.  Morphine dose not as helpful, taking every 4 hours for dyspnea. She is really dependent on it.  She has some constipation. She is tolerating the Esbriet  Past Medical History  Diagnosis Date  . Fibromyalgia    . GERD (gastroesophageal reflux disease)   . CHF (congestive heart failure) ([place])   . Esophageal stricture   . Depression   . H/O hiatal hernia   . Oxygen dependent     07-05-13 oxygen 24/7 -3 l/m daytime, 4 l/m nighttime nasally.  Marland Kitchen COPD (chronic obstructive pulmonary disease) (HCC)     Dr. Lake Bells [facility]     Review of Systems  Constitutional: Negative for fever, chills and unexpected weight change.  HENT: Negative for congestion, nosebleeds, postnasal drip, rhinorrhea and sneezing.   Respiratory: Positive for shortness of breath. Negative for cough and choking.   Cardiovascular: Negative for chest pain and leg swelling.  Gastrointestinal: Positive for constipation.       Objective:   Physical Exam  Filed Vitals:   01/24/15 1424  BP: 120/64  Pulse: 83  Height: 4\' 11"  (1.499 m)  Weight: 131 lb (59.421 kg)  SpO2: 92%   4 L nasal cannula  Gen: chronically ill appearing, no acute distress HEENT: NCAT, OP clear, nasopharynx clear PULM:  No wheezing today, crackles in bases noted CV: RRR, systolic murmur noted, no JVD AB: BS+, soft, nontender Ext: Left leg excoriated rash improved since the last exam  150 pack year smoking history, quit 2001 05/2011 PFT [facility] >> Ratio 61%, FEV1 1.43 L 91% pred  TLC 4.05 L 106% pred DLCO 41% pred 06/2011 Overnight oximetry >> 96% of the time her O2 saturation was between 80 and 90% (94% under 88%) 06/2011 CT Chest [facility]>> marked emphysema and basilar fibrosis  08/2011 started on 3L O2 with exertion 08/2011 6MW >> 750 ft., O2 saturation 79% on exercise;  09/2011 [facility] 3 11/2011 Pulm rehab [facility] 01/2012 6MW 1010 ft, O2 sat 90% nadir 3L 05/2012 IgE > normal 05/2012 sputum AFB >> 05/2012 sputum fungal >> c. Albicans 07/2012 CT chest >> bilateral severe emphysema; question of honeycombing and interstitial thickening in the bases, not clearly UIP: Kathryn Ruiz read> no significant progression since the 06/2011 study 12/2012 PFT> ratio 65%, FEV1 1.29  L (85% pred), no change with BD, TLC 3.52L (93% pred), DLCO 6.2 (36% pred) 12/2012 ILD serology panel negative\ February 2015 barium swallow> mild narrowing of the lower cervical esophagus which results in transient obstruction of the 12 mm barium pill, high grade narrowing of the distal esophagus just proximal to a moderate sized partially reducible hiatal hernia. Maximal measured luminal diameter is 4 mm. 12 mm barium pill would not pass. 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 02/2013 Echo> Normal LVEF, mild LVH and diastolic dysfunction; normal RV and RVSP 03/2013 rejected for consideration for lung transplant [facility] (dysphagia) January 2016 6 minute walk> 299 feet, could only walk 3 minutes, O2 saturation try to 84% on 3 L nasal cannula December 2015 full pulmonary function test> ratio 64%, FEV1 1.22 L (82% predicted), total lung capacity 3.10 L (82% predicted), DLCO 4.7 (28% predicted). October 2016 full pulmonary function testing ratio 68%, FEV1 1.46 L (86% predicted), FVC 2.14 L (94% predicted), DLCO 7.85 (44% predicted).      Assessment & Plan:   Pulmonary fibrosis ([place]) Kathryn Ruiz has stable lung function testing in October 2016 but she continues to complain of debilitating dyspnea. She states that she "can't do anything" without becoming severely short of breath. She is using the morphine nearly around-the-clock at this point for relief of dyspnea but still has shortness of breath which is severe when just going to the bathroom or other activity. I'm a bit concerned that she's developing dependency to the morphine. It has provided some relief of symptoms which is important, but she stating that she's got constipation and it doesn't work as well as before. Her husband is asking for an increased dose for her to use which is understandable considering the severity of her symptoms.  She has been intolerant of the Esbriet. She has a mild elevation in her alkaline phosphatase but this is been  stable.  Plan: Increase morphine, cautiously I counseled the patient, her husband, and her daughter at length today that I only want him to use the morphine for dyspnea. If they're using the morphine for pain and I went in to discuss her pain issues with her primary care physician. I also strongly encouraged any sort of alternative type therapy for pain. Continue Esbriet We may have to start a formal hospice referral this year.  Lack of appetite She has a serious lack of appetite and has lost nearly 20 pounds in the last year. This goes along with the severity of her disease.  [place] her family is requesting Megace. I think this is not unreasonable. Will prescribe daily dosing.   Greater than 50% of time in a 25 minute visit spent face-to-face today.  Updated Medication List Outpatient Encounter Prescriptions as of 01/24/2015  Medication Sig  . amitriptyline (ELAVIL) 10 MG tablet  Take three tablets every day.  . diphenhydrAMINE (BENADRYL) 25 MG tablet Take 50 mg by mouth at bedtime as needed for sleep.  . DULoxetine (CYMBALTA) 30 MG capsule Take 2 capsules daily  . fluticasone (VERAMYST) 27.5 MCG/SPRAY nasal spray Place 2 sprays into the nose daily.  . furosemide (LASIX) 20 MG tablet TAKE ONE (1) TABLET EACH DAY  . guaiFENesin-codeine (CHERATUSSIN AC) 100-10 MG/5ML syrup Take 5 mLs by mouth 3 (three) times daily as needed for cough.  Marland Kitchen lisinopril (PRINIVIL,ZESTRIL) 10 MG tablet Take 1 tablet (10 mg total) by mouth daily.  Marland Kitchen morphine 20 MG/5ML solution Take 1.3 mLs (5.2 mg total) by mouth every 4 (four) hours as needed (shortness of breath).  . pantoprazole (PROTONIX) 40 MG tablet Take 1 tablet (40 mg total) by mouth daily.  . Pirfenidone 267 MG CAPS Take 3 capsules by mouth 3 (three) times daily.  . potassium chloride (KLOR-CON) 20 MEQ packet Take 20 mEq by mouth daily.  Marland Kitchen Umeclidinium-Vilanterol (ANORO ELLIPTA) 62.5-25 MCG/INH AEPB Inhale 1 puff into the lungs daily.  Marland Kitchen zolpidem  (AMBIEN CR) 6.25 MG CR tablet Take 1 tablet (6.25 mg total) by mouth at bedtime as needed for sleep.  . [DISCONTINUED] morphine 20 MG/5ML solution Take 0.6 mLs (2.4 mg total) by mouth every 4 (four) hours as needed (shortness of breath).  . megestrol (MEGACE ES) 625 MG/5ML suspension Take 5 mLs (625 mg total) by mouth daily.   No facility-administered encounter medications on file as of 01/24/2015.

## 2015-01-24 NOTE — Patient Instructions (Signed)
Use the morphine only for shortness of breath, be cautious using it for pain. One should to address your pain complaints with your primary care physician again Do not take the morphine anymore frequently than prescribed Keep taking the inhaled medications Use Mucinex over-the-counter (I like extended release guaifenesin 1200 mg twice a day) for the mucus production We will see you back in 3 months or sooner if needed

## 2015-01-24 NOTE — Assessment & Plan Note (Signed)
Kathryn Ruiz has stable lung function testing in October 2016 but she continues to complain of debilitating dyspnea. She states that she "can't do anything" without becoming severely short of breath. She is using the morphine nearly around-the-clock at this point for relief of dyspnea but still has shortness of breath which is severe when just going to the bathroom or other activity. I'm a bit concerned that she's developing dependency to the morphine. It has provided some relief of symptoms which is important, but she stating that she's got constipation and it doesn't work as well as before. Her husband is asking for an increased dose for her to use which is understandable considering the severity of her symptoms.  She has been intolerant of the Esbriet. She has a mild elevation in her alkaline phosphatase but this is been stable.  Plan: Increase morphine, cautiously I counseled the patient, her husband, and her daughter at length today that I only want him to use the morphine for dyspnea. If they're using the morphine for pain and I went in to discuss her pain issues with her primary care physician. I also strongly encouraged any sort of alternative type therapy for pain. Continue Esbriet We may have to start a formal hospice referral this year.

## 2015-02-24 ENCOUNTER — Telehealth: Payer: Self-pay | Admitting: Pulmonary Disease

## 2015-02-24 NOTE — Telephone Encounter (Signed)
lmtcb x1 for pt. 

## 2015-02-25 NOTE — Telephone Encounter (Signed)
atc X2, line rang a few times and went to fast busy signal. Wcb.

## 2015-02-26 NOTE — Telephone Encounter (Signed)
Pt cb requesting we call her husband at [phone]

## 2015-02-26 NOTE — Telephone Encounter (Signed)
Called and spoke with the patient's husband. He states that he received a letter stating that medicare no longer will use [facility] as of 03/25/15. He stated that in the letter it states you can call [phone] to discuss this with a rep [facility]. His concern is that the new pharmacy will not accept the [employer] grant that his wife receives for her Esbriet. I explained to the patient that I would contact this number and discuss this with a rep. He voiced understanding and had no further questions.   Called [phone] and spoke with Lenna Sciara at [facility]. She states that [facility] will be the pharmacy that the [place] is filled through and they will accept the grant. She states that the referral has not be placed yet and that the patient will need to receive refills up to 03/25/15 through [facility]. She is faxing the referral form to our office at [phone]. The form must be completed and any forms that pertain to the grant need to be faxed back to [facility]. Once received they will send them to [facility] to keep on file so that on 03/25/15 the patient will automacally be transferred into [employer]'s system. She states that a week before 03/25/15 the patient will need to call them to discuss the change.   Called and spoke with the patient's husband. Informed him of the above. He voiced understanding and had no further questions.  Will send message to [facility] for follow up.

## 2015-02-26 NOTE — Telephone Encounter (Signed)
Spoke with the pt  She prefers that her spouse "explain the situation" He is not available at this time, but she will have him call us back

## 2015-02-28 NOTE — Telephone Encounter (Signed)
No form has yet been received. Called [facility] and spoke with Raquel Sarna. She is going to send the referral form again to the fax upfront. She reports it can take up 24 hrs to receive. Will forward to Caryl Pina so she is aware

## 2015-02-28 NOTE — Telephone Encounter (Signed)
I have received this form off the fax and placed it in BQ look at folder upfront to be passed out at end of today

## 2015-03-04 NOTE — Telephone Encounter (Signed)
Kathryn Ruiz has this form been completed? Thanks.

## 2015-03-04 NOTE — Telephone Encounter (Signed)
Forms faxed. Pt aware.  Nothing further needed.

## 2015-04-18 ENCOUNTER — Telehealth: Payer: Self-pay | Admitting: Pulmonary Disease

## 2015-04-18 NOTE — Telephone Encounter (Signed)
Checked in BQ's lookat and checked with Caryl Pina - no sign of PA forms [employer] and spoke with Wells Guiles who reported they will refax the PA forms Will hold message in triage to await forms

## 2015-04-21 NOTE — Telephone Encounter (Signed)
Called [facility] and spoke with Levada Dy, answered additional questions for pt's PA for Esbriet.  PA has been resubmitted for clinical review.  Will await decision.

## 2015-04-21 NOTE — Telephone Encounter (Signed)
Now [facility] is calling about the PA forms they have additional questions for the PA   [phone] Ref#[id number]

## 2015-04-21 NOTE — Telephone Encounter (Signed)
Forms faxed from [facility] office. Submitted PA over the phone for at [phone]. Will await response. States it takes 24-72 hours. States she couldn't give a PA ref # and to call back tomorrow.

## 2015-04-24 ENCOUNTER — Ambulatory Visit: Payer: Medicare Other | Admitting: Pulmonary Disease

## 2015-04-24 NOTE — Telephone Encounter (Signed)
PA for Kathryn Ruiz has been approved through 04/16/2016.  Nothing further needed.

## 2015-05-22 ENCOUNTER — Ambulatory Visit: Payer: Medicare Other | Admitting: Family Medicine

## 2015-05-23 ENCOUNTER — Encounter (HOSPITAL_COMMUNITY): Payer: Self-pay

## 2015-05-23 ENCOUNTER — Inpatient Hospital Stay (HOSPITAL_COMMUNITY)
Admission: AD | Admit: 2015-05-23 | Discharge: 2015-05-26 | DRG: 682 | Disposition: A | Discharge status: Hospice | Payer: Medicare Other | Source: Ambulatory Visit | Attending: Pulmonary Disease | Admitting: Pulmonary Disease

## 2015-05-23 ENCOUNTER — Encounter: Payer: Self-pay | Admitting: Pulmonary Disease

## 2015-05-23 ENCOUNTER — Inpatient Hospital Stay (HOSPITAL_COMMUNITY): Payer: Medicare Other

## 2015-05-23 ENCOUNTER — Ambulatory Visit (INDEPENDENT_AMBULATORY_CARE_PROVIDER_SITE_OTHER): Payer: Medicare Other | Admitting: Pulmonary Disease

## 2015-05-23 VITALS — BP 124/60 | HR 75 | Ht 59.0 in | Wt 119.2 lb

## 2015-05-23 DIAGNOSIS — Z66 Do not resuscitate: Secondary | ICD-10-CM | POA: Diagnosis not present

## 2015-05-23 DIAGNOSIS — M797 Fibromyalgia: Secondary | ICD-10-CM | POA: Diagnosis present

## 2015-05-23 DIAGNOSIS — K5909 Other constipation: Secondary | ICD-10-CM | POA: Diagnosis not present

## 2015-05-23 DIAGNOSIS — Z79899 Other long term (current) drug therapy: Secondary | ICD-10-CM

## 2015-05-23 DIAGNOSIS — J9611 Chronic respiratory failure with hypoxia: Secondary | ICD-10-CM | POA: Diagnosis present

## 2015-05-23 DIAGNOSIS — K219 Gastro-esophageal reflux disease without esophagitis: Secondary | ICD-10-CM | POA: Diagnosis not present

## 2015-05-23 DIAGNOSIS — J449 Chronic obstructive pulmonary disease, unspecified: Secondary | ICD-10-CM | POA: Diagnosis present

## 2015-05-23 DIAGNOSIS — J8410 Pulmonary fibrosis, unspecified: Secondary | ICD-10-CM | POA: Diagnosis not present

## 2015-05-23 DIAGNOSIS — I509 Heart failure, unspecified: Secondary | ICD-10-CM | POA: Diagnosis not present

## 2015-05-23 DIAGNOSIS — Z7189 Other specified counseling: Secondary | ICD-10-CM

## 2015-05-23 DIAGNOSIS — M8580 Other specified disorders of bone density and structure, unspecified site: Secondary | ICD-10-CM | POA: Diagnosis not present

## 2015-05-23 DIAGNOSIS — J849 Interstitial pulmonary disease, unspecified: Secondary | ICD-10-CM

## 2015-05-23 DIAGNOSIS — K224 Dyskinesia of esophagus: Secondary | ICD-10-CM | POA: Diagnosis present

## 2015-05-23 DIAGNOSIS — F1120 Opioid dependence, uncomplicated: Secondary | ICD-10-CM | POA: Diagnosis present

## 2015-05-23 DIAGNOSIS — J84112 Idiopathic pulmonary fibrosis: Secondary | ICD-10-CM | POA: Diagnosis not present

## 2015-05-23 DIAGNOSIS — Z87891 Personal history of nicotine dependence: Secondary | ICD-10-CM

## 2015-05-23 DIAGNOSIS — Z9981 Dependence on supplemental oxygen: Secondary | ICD-10-CM | POA: Diagnosis not present

## 2015-05-23 DIAGNOSIS — J441 Chronic obstructive pulmonary disease with (acute) exacerbation: Secondary | ICD-10-CM | POA: Diagnosis not present

## 2015-05-23 DIAGNOSIS — R0600 Dyspnea, unspecified: Secondary | ICD-10-CM | POA: Insufficient documentation

## 2015-05-23 DIAGNOSIS — Z515 Encounter for palliative care: Secondary | ICD-10-CM | POA: Diagnosis not present

## 2015-05-23 DIAGNOSIS — G8929 Other chronic pain: Secondary | ICD-10-CM | POA: Diagnosis present

## 2015-05-23 DIAGNOSIS — R627 Adult failure to thrive: Secondary | ICD-10-CM | POA: Diagnosis present

## 2015-05-23 DIAGNOSIS — K5900 Constipation, unspecified: Secondary | ICD-10-CM

## 2015-05-23 DIAGNOSIS — I110 Hypertensive heart disease with heart failure: Secondary | ICD-10-CM | POA: Diagnosis not present

## 2015-05-23 DIAGNOSIS — E860 Dehydration: Secondary | ICD-10-CM | POA: Diagnosis present

## 2015-05-23 DIAGNOSIS — Z8249 Family history of ischemic heart disease and other diseases of the circulatory system: Secondary | ICD-10-CM | POA: Diagnosis not present

## 2015-05-23 DIAGNOSIS — E43 Unspecified severe protein-calorie malnutrition: Secondary | ICD-10-CM

## 2015-05-23 DIAGNOSIS — K222 Esophageal obstruction: Secondary | ICD-10-CM | POA: Diagnosis not present

## 2015-05-23 DIAGNOSIS — N179 Acute kidney failure, unspecified: Principal | ICD-10-CM

## 2015-05-23 DIAGNOSIS — R1310 Dysphagia, unspecified: Secondary | ICD-10-CM

## 2015-05-23 DIAGNOSIS — R29898 Other symptoms and signs involving the musculoskeletal system: Secondary | ICD-10-CM | POA: Diagnosis present

## 2015-05-23 DIAGNOSIS — R1319 Other dysphagia: Secondary | ICD-10-CM | POA: Diagnosis present

## 2015-05-23 DIAGNOSIS — F329 Major depressive disorder, single episode, unspecified: Secondary | ICD-10-CM | POA: Diagnosis not present

## 2015-05-23 DIAGNOSIS — R634 Abnormal weight loss: Secondary | ICD-10-CM | POA: Insufficient documentation

## 2015-05-23 DIAGNOSIS — J9610 Chronic respiratory failure, unspecified whether with hypoxia or hypercapnia: Secondary | ICD-10-CM | POA: Diagnosis not present

## 2015-05-23 LAB — COMPREHENSIVE METABOLIC PANEL
ALT: 11 U/L — ABNORMAL LOW (ref 14–54)
ANION GAP: 13 (ref 5–15)
AST: 18 U/L (ref 15–41)
Albumin: 3.9 g/dL (ref 3.5–5.0)
Alkaline Phosphatase: 91 U/L (ref 38–126)
BILIRUBIN TOTAL: 0.5 mg/dL (ref 0.3–1.2)
BUN: 63 mg/dL — ABNORMAL HIGH (ref 6–20)
CO2: 19 mmol/L — ABNORMAL LOW (ref 22–32)
Calcium: 9.3 mg/dL (ref 8.9–10.3)
Chloride: 105 mmol/L (ref 101–111)
Creatinine, Ser: 2.07 mg/dL — ABNORMAL HIGH (ref 0.44–1.00)
GFR, EST AFRICAN AMERICAN: 26 mL/min — AB (ref >60)
GFR, EST NON AFRICAN AMERICAN: 22 mL/min — AB (ref >60)
Glucose, Bld: 95 mg/dL (ref 65–99)
POTASSIUM: 5.4 mmol/L — AB (ref 3.5–5.1)
Sodium: 137 mmol/L (ref 135–145)
TOTAL PROTEIN: 7.8 g/dL (ref 6.5–8.1)

## 2015-05-23 LAB — URINALYSIS, ROUTINE W REFLEX MICROSCOPIC
Bilirubin Urine: NEGATIVE
Glucose, UA: NEGATIVE mg/dL
Hgb urine dipstick: NEGATIVE
KETONES UR: NEGATIVE mg/dL
LEUKOCYTES UA: NEGATIVE
NITRITE: NEGATIVE
PH: 5 (ref 5.0–8.0)
PROTEIN: NEGATIVE mg/dL
Specific Gravity, Urine: 1.011 (ref 1.005–1.030)

## 2015-05-23 LAB — CBC WITH DIFFERENTIAL/PLATELET
BASOS ABS: 0 10*3/uL (ref 0.0–0.1)
Basophils Relative: 0 %
EOS PCT: 1 %
Eosinophils Absolute: 0.2 10*3/uL (ref 0.0–0.7)
HEMATOCRIT: 37.2 % (ref 36.0–46.0)
Hemoglobin: 11.9 g/dL — ABNORMAL LOW (ref 12.0–15.0)
LYMPHS PCT: 17 %
Lymphs Abs: 1.9 10*3/uL (ref 0.7–4.0)
MCH: 27 pg (ref 26.0–34.0)
MCHC: 32 g/dL (ref 30.0–36.0)
MCV: 84.5 fL (ref 78.0–100.0)
Monocytes Absolute: 1.2 10*3/uL — ABNORMAL HIGH (ref 0.1–1.0)
Monocytes Relative: 10 %
NEUTROS ABS: 8.1 10*3/uL — AB (ref 1.7–7.7)
NEUTROS PCT: 72 %
PLATELETS: 309 10*3/uL (ref 150–400)
RBC: 4.4 MIL/uL (ref 3.87–5.11)
RDW: 14.2 % (ref 11.5–15.5)
WBC: 11.3 10*3/uL — AB (ref 4.0–10.5)

## 2015-05-23 LAB — TSH: TSH: 1.506 u[IU]/mL (ref 0.350–4.500)

## 2015-05-23 MED ORDER — PANTOPRAZOLE SODIUM 40 MG IV SOLR
40.0000 mg | INTRAVENOUS | Status: DC
  Administered 2015-05-23 – 2015-05-25 (×3): 40 mg via INTRAVENOUS
  Filled 2015-05-23 (×3): qty 40

## 2015-05-23 MED ORDER — BOOST / RESOURCE BREEZE PO LIQD
1.0000 | Freq: Two times a day (BID) | ORAL | Status: DC
  Administered 2015-05-23 – 2015-05-25 (×5): 1 via ORAL

## 2015-05-23 MED ORDER — ONDANSETRON HCL 4 MG PO TABS: 4.0000 mg | ORAL_TABLET | Freq: Four times a day (QID) | ORAL | Status: DC | PRN

## 2015-05-23 MED ORDER — ALBUTEROL SULFATE (2.5 MG/3ML) 0.083% IN NEBU: 2.5000 mg | INHALATION_SOLUTION | RESPIRATORY_TRACT | Status: DC | PRN

## 2015-05-23 MED ORDER — DEXTROSE-NACL 5-0.45 % IV SOLN
INTRAVENOUS | Status: DC
  Administered 2015-05-23: 14:00:00 via INTRAVENOUS
  Administered 2015-05-24: 50 mL/h via INTRAVENOUS
  Administered 2015-05-25 – 2015-05-26 (×2): 1000 mL via INTRAVENOUS

## 2015-05-23 MED ORDER — MORPHINE SULFATE 20 MG/5ML PO SOLN: 5.0000 mg | ORAL | Status: DC | PRN

## 2015-05-23 MED ORDER — ONDANSETRON HCL 4 MG/2ML IJ SOLN
4.0000 mg | Freq: Four times a day (QID) | INTRAMUSCULAR | Status: DC | PRN
  Administered 2015-05-24: 4 mg via INTRAVENOUS
  Filled 2015-05-23: qty 2

## 2015-05-23 MED ORDER — ENSURE ENLIVE PO LIQD: 237.0000 mL | Freq: Two times a day (BID) | ORAL | Status: DC

## 2015-05-23 MED ORDER — MORPHINE SULFATE 10 MG/5ML PO SOLN
5.0000 mg | ORAL | Status: DC | PRN
  Administered 2015-05-23 – 2015-05-24 (×4): 5 mg via ORAL
  Filled 2015-05-23 (×4): qty 5

## 2015-05-23 MED ORDER — UMECLIDINIUM BROMIDE 62.5 MCG/INH IN AEPB
1.0000 | INHALATION_SPRAY | Freq: Every day | RESPIRATORY_TRACT | Status: DC
  Administered 2015-05-24 – 2015-05-25 (×2): 1 via RESPIRATORY_TRACT
  Filled 2015-05-23: qty 7

## 2015-05-23 MED ORDER — PANTOPRAZOLE SODIUM 40 MG PO PACK
40.0000 mg | PACK | Freq: Every day | ORAL | Status: DC
  Filled 2015-05-23: qty 20

## 2015-05-23 MED ORDER — UMECLIDINIUM-VILANTEROL 62.5-25 MCG/INH IN AEPB
1.0000 | INHALATION_SPRAY | Freq: Every day | RESPIRATORY_TRACT | Status: DC
Start: 2015-05-23 — End: 2015-05-23

## 2015-05-23 MED ORDER — SODIUM CHLORIDE 0.9 % IV SOLN: 250.0000 mL | INTRAVENOUS | Status: DC | PRN

## 2015-05-23 MED ORDER — ARFORMOTEROL TARTRATE 15 MCG/2ML IN NEBU
15.0000 ug | INHALATION_SOLUTION | Freq: Two times a day (BID) | RESPIRATORY_TRACT | Status: DC
  Administered 2015-05-24 – 2015-05-25 (×4): 15 ug via RESPIRATORY_TRACT
  Filled 2015-05-23 (×9): qty 2

## 2015-05-23 MED ORDER — ENOXAPARIN SODIUM 30 MG/0.3ML ~~LOC~~ SOLN
30.0000 mg | SUBCUTANEOUS | Status: DC
  Administered 2015-05-24 – 2015-05-25 (×2): 30 mg via SUBCUTANEOUS
  Filled 2015-05-23 (×2): qty 0.3

## 2015-05-23 MED ORDER — PRO-STAT SUGAR FREE PO LIQD
30.0000 mL | Freq: Two times a day (BID) | ORAL | Status: DC
  Administered 2015-05-23 – 2015-05-25 (×2): 30 mL via ORAL
  Filled 2015-05-23 (×5): qty 30

## 2015-05-23 MED ORDER — ENOXAPARIN SODIUM 40 MG/0.4ML ~~LOC~~ SOLN: 40.0000 mg | SUBCUTANEOUS | Status: DC

## 2015-05-23 MED ORDER — SODIUM CHLORIDE 0.9% FLUSH
3.0000 mL | Freq: Two times a day (BID) | INTRAVENOUS | Status: DC
  Administered 2015-05-23 – 2015-05-25 (×3): 3 mL via INTRAVENOUS

## 2015-05-23 MED ORDER — UMECLIDINIUM-VILANTEROL 62.5-25 MCG/INH IN AEPB: 1.0000 | INHALATION_SPRAY | Freq: Every day | RESPIRATORY_TRACT | Status: DC

## 2015-05-23 MED ORDER — SODIUM CHLORIDE 0.9% FLUSH: 3.0000 mL | INTRAVENOUS | Status: DC | PRN

## 2015-05-23 MED ORDER — BOOST / RESOURCE BREEZE PO LIQD: 1.0000 | Freq: Three times a day (TID) | ORAL | Status: DC

## 2015-05-23 NOTE — Consult Note (Signed)
Consultation  Referring Provider:  Dr Lake Bells Primary Care Physician:  Dicky Doe, MD Primary Gastroenterologist:  Dr.Pyrtle  Reason for Consultation:  Severe dysphagia /FTT  HPI: Kathryn Ruiz is a 75 y.o. female Admitted today after office visit with  Pulmonary/Tammy. NP/Dr. Lake Bells. Patient has history of advanced COPD and pulmonary fibrosis. She has chronic hypoxic respiratory failure and is now maintained on 4 L of O2 continuously daytime and nighttime. Also with history of fibromyalgia, GERD, CHF, and depression. She has history of  Esophageal stricture and has undergone prior dilations. Her last procedure was done in June 2015 per Dr. Hilarie Fredrickson (with [facility]). She was noted to have a Schatzki's ring at 34 cm and a 4 cm hiatal hernia which was balloon dilated to 15 mm. She was also noted to have slight narrowing at the cervical esophagus. Patient states that her swallowing definitely improved after that procedure. She has had gradual recurrence of dysphagia over the past few months but over the past 1 month has had severe dysphagia to the point where she is really not eating or drinking anything other than  Cold milk. She says that everything seems to  Get stuck and even swallowing pills, sometimes the fluid regurgitates back up. She has stopped most of her oral medications over the past month. Her weight is down from 131-118 over the past 4-5 weeks. She denies any significant odynophagia.. She is  unable to tolerate Boost or Ensure because it makes her nauseated and sometimes gives her severe abdominal cramps. She denies any abdominal pain but says she has absolutely no appetite at times has been having dry heaves. No fever chills etc. She was admitted for failure to thrive today for further evaluation of the dysphagia. She and her family are very concerned about any further procedures because Dr. Hilarie Fredrickson had told them in 2015 that that would be the last endoscopy due to her advanced lung  disease.   Past Medical History  Diagnosis Date  . Fibromyalgia   . GERD (gastroesophageal reflux disease)   . CHF (congestive heart failure) ([place])   . Esophageal stricture   . Depression   . H/O hiatal hernia   . Oxygen dependent     07-05-13 oxygen 24/7 -3 l/m daytime, 4 l/m nighttime nasally.  Marland Kitchen COPD (chronic obstructive pulmonary disease) ([facility])     Dr. Lake Bells -[facility]    Past Surgical History  Procedure Laterality Date  . Sinus surgery  1999  . Appendectomy  1954  . Cataract extraction  1998    both  . Neuroma surgery  2001  . Foot irrigation  2002  . Disectomy  2003  . Vocal cord biopsy  2005  . Shoulder surgery Left 2010  . Varicose vein surgery Bilateral   . Esophagogastroduodenoscopy (egd) with propofol N/A 07/17/2013    Procedure: ESOPHAGOGASTRODUODENOSCOPY (EGD) WITH PROPOFOL;  Surgeon: Jerene Bears, MD;  Location: [facility];  Service: Gastroenterology;  Laterality: N/A;    Prior to Admission medications   Medication Sig Start Date End Date Taking? Authorizing Provider  diphenhydrAMINE (BENADRYL) 25 MG tablet Take 50 mg by mouth at bedtime as needed for sleep.    Historical Provider, MD  fluticasone (VERAMYST) 27.5 MCG/SPRAY nasal spray Place 2 sprays into the nose daily.    Historical Provider, MD  furosemide (LASIX) 20 MG tablet TAKE ONE (1) TABLET EACH DAY 12/13/14   Arlis Porta., MD  guaiFENesin-codeine [facility]) 100-10 MG/5ML syrup Take 5 mLs  by mouth 3 (three) times daily as needed for cough. 04/09/14   Juanito Doom, MD  lisinopril (PRINIVIL,ZESTRIL) 10 MG tablet Take 1 tablet (10 mg total) by mouth daily. 11/25/14   Arlis Porta., MD  megestrol (MEGACE ES) 625 MG/5ML suspension Take 5 mLs (625 mg total) by mouth daily. 01/24/15   Juanito Doom, MD  morphine 20 MG/5ML solution Take 1.3 mLs (5.2 mg total) by mouth every 4 (four) hours as needed (shortness of breath). 01/24/15   Juanito Doom, MD  pantoprazole  (PROTONIX) 40 MG tablet Take 1 tablet (40 mg total) by mouth daily. 08/02/14   Gatha Mayer, MD  Pirfenidone 267 MG CAPS Take 3 capsules by mouth 3 (three) times daily. Patient not taking: Reported on 05/23/2015 09/23/14   Juanito Doom, MD  potassium chloride (KLOR-CON) 20 MEQ packet Take 20 mEq by mouth daily. Reported on 05/23/2015    Historical Provider, MD  Umeclidinium-Vilanterol (ANORO ELLIPTA) 62.5-25 MCG/INH AEPB Inhale 1 puff into the lungs daily. 10/30/14   Juanito Doom, MD  zolpidem (AMBIEN CR) 6.25 MG CR tablet Take 1 tablet (6.25 mg total) by mouth at bedtime as needed for sleep. 06/27/13   Juanito Doom, MD    Current Facility-Administered Medications  Medication Dose Route Frequency Provider Last Rate Last Dose  . 0.9 %  sodium chloride infusion  250 mL Intravenous PRN Tammy S Parrett, NP      . albuterol (PROVENTIL) (2.5 MG/3ML) 0.083% nebulizer solution 2.5 mg  2.5 mg Nebulization Q4H PRN Tammy S Parrett, NP      . arformoterol (BROVANA) nebulizer solution 15 mcg  15 mcg Nebulization BID Juanito Doom, MD       And  . umeclidinium bromide (INCRUSE ELLIPTA) 62.5 MCG/INH 1 puff  1 puff Inhalation Daily Juanito Doom, MD      . dextrose 5 %-0.45 % sodium chloride infusion   Intravenous Continuous Tammy S Parrett, NP      . enoxaparin (LOVENOX) injection 40 mg  40 mg Subcutaneous Q24H Tammy S Parrett, NP      . feeding supplement (ENSURE ENLIVE) (ENSURE ENLIVE) liquid 237 mL  237 mL Oral BID BM Juanito Doom, MD      . morphine 10 MG/5ML solution 5 mg  5 mg Oral Q4H PRN Tammy S Parrett, NP      . ondansetron (ZOFRAN) tablet 4 mg  4 mg Oral Q6H PRN Tammy S Parrett, NP       Or  . ondansetron (ZOFRAN) injection 4 mg  4 mg Intravenous Q6H PRN Tammy S Parrett, NP      . pantoprazole sodium (PROTONIX) 40 mg/20 mL oral suspension 40 mg  40 mg Per Tube Daily Tammy S Parrett, NP      . sodium chloride flush (NS) 0.9 % injection 3 mL  3 mL Intravenous Q12H Tammy S  Parrett, NP      . sodium chloride flush (NS) 0.9 % injection 3 mL  3 mL Intravenous PRN Melvenia Needles, NP        Allergies as of 05/23/2015  . (No Known Allergies)    Family History  Problem Relation Age of Onset  . Heart attack Father 104  . Heart disease Mother     Social History   Social History  . Marital Status: Married    Spouse Name: N/A  . Number of Children: 5  . Years of Education: N/A  Occupational History  . Retired     Water engineer   Social History Main Topics  . Smoking status: Former Smoker -- 3.00 packs/day for 50 years    Types: Cigarettes    Quit date: 01/26/1999  . Smokeless tobacco: Never Used  . Alcohol Use: No  . Drug Use: No  . Sexual Activity: No   Other Topics Concern  . Not on file   Social History Narrative    Review of Systems: Pertinent positive and negative review of systems were noted in the above HPI section.  All other review of systems was otherwise negative.Marland Kitchen  Physical Exam: Vital signs in last 24 hours: Temp:  [98.6 F (37 C)] 98.6 F (37 C) (04/28 1355) Pulse Rate:  [75] 75 (04/28 1355) Resp:  [14] 14 (04/28 1355) BP: (113-124)/(60-78) 113/78 mmHg (04/28 1355) SpO2:  [96 %-100 %] 100 % (04/28 1355) Weight:  [118 lb (53.524 kg)-119 lb 3.2 oz (54.069 kg)] 118 lb (53.524 kg) (04/28 1355)   General:   Alert,  Well-developed, well-nourished, pleasant and cooperative in NAD- on 4 liters 02 Head:  Normocephalic and atraumatic. Eyes:  Sclera clear, no icterus.   Conjunctiva pink. Ears:  Normal auditory acuity. Nose:  No deformity, discharge,  or lesions. Mouth:  No deformity or lesions.   Neck:  Supple; no masses or thyromegaly. Lungs:  Clear throughout to auscultation.   Decreased BS bilat  Heart:  Regular rate and rhythm; no murmurs, clicks, rubs,  or gallops. Abdomen:  Soft,nontender, BS active,nonpalp mass or hsm.   Rectal:  Deferred  Msk:  Symmetrical without gross deformities. . Pulses:  Normal pulses  noted. Extremities:  Without clubbing or edema. Neurologic:  Alert and  oriented x4;  grossly normal neurologically. Skin:  Intact without significant lesions or rashes.. Psych:  Alert and cooperative. Normal mood and affect.  Intake/Output from previous day:   Intake/Output this shift:    Lab Results:  Recent Labs  05/23/15 1354  WBC 11.3*  HGB 11.9*  HCT 37.2  PLT 309   BMET No results for input(s): NA, K, CL, CO2, GLUCOSE, BUN, CREATININE, CALCIUM in the last 72 hours. LFT No results for input(s): PROT, ALBUMIN, AST, ALT, ALKPHOS, BILITOT, BILIDIR, IBILI in the last 72 hours. PT/INR No results for input(s): LABPROT, INR in the last 72 hours. Hepatitis Panel No results for input(s): HEPBSAG, HCVAB, HEPAIGM, HEPBIGM in the last 72 hours.     IMPRESSION:  #2 75 year old female with chronic hypoxic respiratory failure secondary to COPD and pulmonary fibrosis requiring 4 L of nasal O2 continuously chronically. 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 Patient has absolutely no appetite and states she can't stand smell of foods. This is not consistent with simple esophageal stricture #4 fibromyalgia #5 malnutrition   Plan; await baseline labs, chest x-ray and barium swallow. Am  concerned that she may have another process accounting for significant anorexia. Perhaps this is secondary to  severe COPD, ?Underlying malignancy  Will discuss and decide about feasibility of EGD with dilation based on barium swallow findings.. She is obviously at high risk for pulmonary complications with any type of anesthesia. Add resource/Clear liquid supplements 3 times daily She is being placed on Lovenox for prophylaxis and this will need  to be  held  prior to EGD     [name]  05/23/2015, 2:08 PM  GI ATTENDING  History, laboratories, x-rays, prior endoscopy report reviewed. Case discussed with advanced practitioner. Patient with severe advanced lung disease and failing to thrive. Extremely high risk for invasive procedures.Agree with plans for esophagram  Sheika Coutts N. Geri [place]., M.D. [facility] Division of Gastroenterology

## 2015-05-23 NOTE — H&P (Signed)
Name: Kathryn Ruiz MRN: [id number] DOB: 1940/05/26    ADMISSION DATE:  05/23/15    CHIEF COMPLAINT:  Cant eat.   BRIEF PATIENT DESCRIPTION: 75 yo female former heavy smoker with Severe COPD /Emphysema , Pulmonary Fibrosis on home O2, admitted from office with severe dysphagia, suspected dehydration and FTT.   SIGNIFICANT EVENTS  4/28 Admit from office  4/28 GI consult  4/28 Palliative Care consult   STUDIES:     HISTORY OF PRESENT ILLNESS:   Pt presented to office for follow up for her pulmonary fibroiss and COPD . She complains that she has been very weak over last month . She has not been able to eat or drink much of anything . She can not swallow any pills. Has severe nausea and dry heaves.. Complains of decreased urination .  Has constipation with little bowel movement. . She has a history of dysphagia with previous esophageal stricture requiring dilation but GI of 2016 says she cant have another endoscopy due to underlying issues. She will require admission for further evaluation and treatment .   Significant History /Events :   150 pack year smoking history, quit 2001 05/2011 PFT [facility] >> Ratio 61%, FEV1 1.43 L 91% pred TLC 4.05 L 106% pred DLCO 41% pred 06/2011 Overnight oximetry >> 96% of the time her O2 saturation was between 80 and 90% (94% under 88%) 06/2011 CT Chest [facility]>> marked emphysema and basilar fibrosis  08/2011 started on 3L O2 with exertion 08/2011 6MW >> 750 ft., O2 saturation 79% on exercise;  09/2011 MMRC 3 11/2011 Pulm rehab [facility] 01/2012 6MW 1010 ft, O2 sat 90% nadir 3L 05/2012 IgE > normal 05/2012 sputum AFB >> 05/2012 sputum fungal >> c. Albicans 07/2012 CT chest >> bilateral severe emphysema; question of honeycombing and interstitial thickening in the bases, not clearly UIP: McQuaid read> no significant progression since the 06/2011 study 12/2012 PFT> ratio 65%, FEV1 1.29 L (85% pred), no change with BD, TLC 3.52L (93% pred), DLCO 6.2 (36% pred) 12/2012  ILD serology panel negative\ February 2015 barium swallow> mild narrowing of the lower cervical esophagus which results in transient obstruction of the 12 mm barium pill, high grade narrowing of the distal esophagus just proximal to a moderate sized partially reducible hiatal hernia. Maximal measured luminal diameter is 4 mm. 12 mm barium pill would not pass. 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 02/2013 Echo> Normal LVEF, mild LVH and diastolic dysfunction; normal RV and RVSP 03/2013 rejected for consideration for lung transplant [facility] (dysphagia) January 2016 6 minute walk> 299 feet, could only walk 3 minutes, O2 saturation try to 84% on 3 L nasal cannula December 2015 full pulmonary function test> ratio 64%, FEV1 1.22 L (82% predicted), total lung capacity 3.10 L (82% predicted), DLCO 4.7 (28% predicted). October 2016 full pulmonary function testing ratio 68%, FEV1 1.46 L (86% predicted), FVC 2.14 L (94% predicted), DLCO 7.85 (44% predicted).    PAST MEDICAL HISTORY :   has a past medical history of Fibromyalgia; GERD (gastroesophageal reflux disease); CHF (congestive heart failure) ([place]); Esophageal stricture; Depression; H/O hiatal hernia; Oxygen dependent; and COPD (chronic obstructive pulmonary disease) ([place]).  has past surgical history that includes sinus surgery (1999); Appendectomy (1954); Cataract extraction (1998); Neuroma surgery (2001); Foot irrigation (2002); disectomy (2003); vocal cord biopsy (2005); Shoulder surgery (Left, 2010); Varicose vein surgery (Bilateral); and Esophagogastroduodenoscopy (egd) with propofol (N/A, 07/17/2013). Prior to Admission medications   Medication Sig Start Date End Date Taking? Authorizing Provider  diphenhydrAMINE (  BENADRYL) 25 MG tablet Take 50 mg by mouth at bedtime as needed for sleep.    Historical Provider, MD  fluticasone (VERAMYST) 27.5 MCG/SPRAY nasal spray Place 2 sprays into the nose daily.    Historical Provider, MD  furosemide (LASIX) 20  MG tablet TAKE ONE (1) TABLET EACH DAY 12/13/14   Arlis Porta., MD  guaiFENesin-codeine [facility]) 100-10 MG/5ML syrup Take 5 mLs by mouth 3 (three) times daily as needed for cough. 04/09/14   Juanito Doom, MD  lisinopril (PRINIVIL,ZESTRIL) 10 MG tablet Take 1 tablet (10 mg total) by mouth daily. 11/25/14   Arlis Porta., MD  megestrol (MEGACE ES) 625 MG/5ML suspension Take 5 mLs (625 mg total) by mouth daily. 01/24/15   Juanito Doom, MD  morphine 20 MG/5ML solution Take 1.3 mLs (5.2 mg total) by mouth every 4 (four) hours as needed (shortness of breath). 01/24/15   Juanito Doom, MD  pantoprazole (PROTONIX) 40 MG tablet Take 1 tablet (40 mg total) by mouth daily. 08/02/14   Gatha Mayer, MD  Pirfenidone 267 MG CAPS Take 3 capsules by mouth 3 (three) times daily. Patient not taking: Reported on 05/23/2015 09/23/14   Juanito Doom, MD  potassium chloride (KLOR-CON) 20 MEQ packet Take 20 mEq by mouth daily. Reported on 05/23/2015    Historical Provider, MD  Umeclidinium-Vilanterol (ANORO ELLIPTA) 62.5-25 MCG/INH AEPB Inhale 1 puff into the lungs daily. 10/30/14   Juanito Doom, MD  zolpidem (AMBIEN CR) 6.25 MG CR tablet Take 1 tablet (6.25 mg total) by mouth at bedtime as needed for sleep. 06/27/13   Juanito Doom, MD   No Known Allergies  FAMILY HISTORY:  family history includes Heart attack (age of onset: 25) in her father; Heart disease in her mother. SOCIAL HISTORY:  reports that she quit smoking about 16 years ago. Her smoking use included Cigarettes. She has a 150 pack-year smoking history. She has never used smokeless tobacco. She reports that she does not drink alcohol or use illicit drugs.  REVIEW OF SYSTEMS:   Constitutional:   + weight loss,  No night sweats,  Fevers, chills,  +fatigue, or  lassitude.  HEENT:   No headaches,  Difficulty swallowing,  Tooth/dental problems, or  Sore throat,                No sneezing, itching, ear ache, nasal  congestion, post nasal drip,   CV:  No chest pain,  Orthopnea, PND, swelling in lower extremities, anasarca, dizziness, palpitations, syncope.   GI  +dysphagia , ++nausea, + constipation  No vomiting, diarrhea, +++change in bowel habits, loss of appetite, bloody stools.   Resp: ++ shortness of breath with exertion or at rest.  No excess mucus, no productive cough,  No non-productive cough,  No coughing up of blood.  No change in color of mucus.  No wheezing.  No chest wall deformity  Skin: no rash or lesions.  GU: no dysuria, change in color of urine, no urgency or frequency.  No flank pain, no hematuria   MS:  No joint pain or swelling.  No decreased range of motion.  No back pain.  Psych:  No change in mood or affect. No depression or anxiety.  No memory loss.      SUBJECTIVE:  Cant eat , very weak.   VITAL SIGNS: Pulse Rate:  [75] 75 (04/28 1127) BP: (124)/(60) 124/60 mmHg (04/28 1127) SpO2:  [96 %] 96 % (04/28  1127) Weight:  [119 lb 3.2 oz (54.069 kg)] 119 lb 3.2 oz (54.069 kg) (04/28 1127)  PHYSICAL EXAMINATION: General:  Frail, elderly in wc on O2 , anxious  Neuro: a./ox 3 , anxious  HEENT:  NCAT , Dry mucosa  Cardiovascular:  RRR , 1-2/SM  Lungs:  Faint BB crackles  Abdomen:  Soft, Gen Tenderness, BS +  Musculoskeletal:  Intact  Skin:  Clear w/ no rash   No results for input(s): NA, K, CL, CO2, BUN, CREATININE, GLUCOSE in the last 168 hours. No results for input(s): HGB, HCT, WBC, PLT in the last 168 hours. No results found.  ASSESSMENT / PLAN:  1. Severe Dysphagia   Plan  GI consult  Check Barium Swallow.  D3 diet with full liquid only .  PPI   2. FTT   Plan  Pallative Care consult.  IV fluids D51/2 NS at 50cc/h    3. COPD/Emphysema   Plan  Continue on ANORO .  Albuterol Neb As needed     4. . Pulmonary Fibrosis   Plan  Esbriet is on hold  Check chest xray .   5. Chronic Hypoxic Resp Failure   Plan  Titrate FiO2 to keep O2 sats >90%.   Morphine As needed  For severe dyspnea.   6. HTN   Plan  Hold Lisinopril for now   Tammy Parrett NP-C  Pulmonary and [place] Pager: [phone]  05/23/2015, 12:26 PM

## 2015-05-23 NOTE — Assessment & Plan Note (Signed)
Kathryn Ruiz is not doing well. She has had severe worsening of her dysphagia along with nausea and vomiting in the last several weeks. She currently is showing signs of fatigue, she's noted significant constipation in oliguria. I'm very concerned about her current condition.  The best approach would be to try to treat the dysphagia again (esophageal stricture) but given her advanced lung disease this may not be feasible. I also think that tube feeding would be of limited benefit considering the severity of her chronic lung disease. I explained this to her family today at length.  Plan: Admit to the hospital Barium swallow GI consult Start IV fluids Palliative care consult to help with assistance in managing goals of care.

## 2015-05-23 NOTE — Progress Notes (Signed)
Initial Nutrition Assessment  DOCUMENTATION CODES:   Severe malnutrition in context of acute illness/injury  INTERVENTION:  -Discontinue Ensure Enlive. -Provide Prostat BID. Each supplement provides 100 kcals and 15 grams of protein. -Boost Breeze BID. Each supplement provides 250 kcals and 9 grams of protein.  -If pt is unable to tolerate PO intakes and within [place]; consider nutrition support.   -TF recommendations  -Initiate Osmolite 1.2 at 20 mls/hr and advance by 10 mls/hr every 12 hours to goal rate of 60 ml/hr. Recommend free water flushes of 150 ml every 8 hrs.  -This provides 1728 kcals, 79 g protein, 1630 mls free water. This will meet 100% of estimated needs.   -Monitor mag, phos, and K for refeeding syndrome -Continue to monitor for nutritional needs.   NUTRITION DIAGNOSIS:   Inadequate oral intake related to nausea, vomiting, poor appetite, dysphagia as evidenced by per patient/family report  GOAL:   Patient will meet greater than or equal to 90% of their needs  MONITOR:   PO intake, Supplement acceptance, Labs, Weight trends, Skin, I & O's, Diet advancement  REASON FOR ASSESSMENT:   Malnutrition Screening Tool    ASSESSMENT:   75 yo female former heavy smoker with Severe COPD /Emphysema , Pulmonary Fibrosis on home O2, admitted from office with severe dysphagia, suspected dehydration and FTT.   Pt seen for MST. Pt has experienced a 10% weight loss in the past 4 months which is significant for time frame.   Pt diet recall reveals eating nothing for the past week. Prior to acute illness, pt reports eating foods like cream of wheat. Pt does not eat anything she has to chew d/t dysphagia. Pt reports poor appetite. Pt reports dry heaving and some nausea. Pt refuses Ensure and Boost. Intern offered Prostat and pt is amenable to trying this. Pt states she is open to trying other nutritional supplements. Will order Prostat and Boost Breeze. May need to consider  nutrition support if pt is unable to tolerate PO diet.   NFPE: Mild muscle depletion, no fat depletion, no edema.   Labs reviewed; no BMP available Meds reviewed.  Diet Order:  Diet full liquid Room service appropriate?: Yes; Fluid consistency:: Thin  Skin:  Reviewed, no issues  Last BM:  unknown  Height:   Ht Readings from Last 1 Encounters:  05/23/15 4\' 11"  (1.499 m)    Weight:   Wt Readings from Last 1 Encounters:  05/23/15 118 lb (53.524 kg)    Ideal Body Weight:  44.5 kg  BMI:  Body mass index is 23.82 kg/(m^2).  Estimated Nutritional Needs:   Kcal:  1600-1800 kcals (30-33 kcals/kg)  Protein:  75-85 g (1.5 g/kg)  Fluid:  1.6-1.8 L  EDUCATION NEEDS:   No education needs identified at this time  Geoffery Lyons, [place] Dietetic Intern Pager [phone]

## 2015-05-23 NOTE — Patient Instructions (Signed)
We will have you admitted to [facility] long hospital for your dysphagia.

## 2015-05-23 NOTE — Progress Notes (Signed)
Subjective:    Patient ID: Kathryn Ruiz, female    DOB: [birth year], 75 y.o.   MRN: [id number]  Synopsis: Kathryn Ruiz was first followed by Dr. Lamonte Sakai in the winter of 2013 the [place] office and then came to the [facility] office in June 2013 for evaluation of COPD and pulmonary fibrosis. She smoked 3 packs a day for 50 years and quit in 2001. CT chest has shown nonspecific fibrotic changes in the bases as well as a significant amount of emphysema throughout her lungs. She has a history of esophageal dysphagia requiring balloon dilation on multiple times in the past. By 2015 her shortness of breath had progressed with a decreased total lung capacity to get the nonspecific fibrotic changes on her CT had not changed. She was started on Ofev in 2015 but was unable to tolerated do to significant nausea and vomiting. She was evaluated by the [facility] lung transplant clinic and was told that she was not a good candidate due to her esophageal dysmotility.  She has an esophageal stricture, but GI as of 2016 says she can't have another endoscopy due to lung disease.   HPI Chief Complaint  Patient presents with  . Follow-up    pt c/o increased sob with any exertion, difficulty eating/drinking anything or taking pills- states she tries to swallow and "it comes right back up" X1 wk.  also notes prod cough with green mucus- states this is normal.     [name] has not been doing well. For a month she hasn't been able to eat or drink much. She has no energy, and she has been constipated and has been having problems with her urine output.  She has been having problems getting any pills down and she feels she can't swallow anything. She hasn't been able to swallow.  She says that just a small tablespoon size of liquid will come back up quickly.  Her family is wondering if she is a candidate for nutrition.   Before this episode, her breathing was still a problem and had progressed before all these symptoms started.  She has  not been able to function for this.  She has not seen her PCP about any of this.    Past Medical History  Diagnosis Date  . Fibromyalgia   . GERD (gastroesophageal reflux disease)   . CHF (congestive heart failure) ([place])   . Esophageal stricture   . Depression   . H/O hiatal hernia   . Oxygen dependent     07-05-13 oxygen 24/7 -3 l/m daytime, 4 l/m nighttime nasally.  Marland Kitchen COPD (chronic obstructive pulmonary disease) ([facility])     Dr. [facility] B[facility]     Review of Systems  Constitutional: Negative for fever, chills and unexpected weight change.  HENT: Negative for congestion, nosebleeds, postnasal drip, rhinorrhea and sneezing.   Respiratory: Positive for shortness of breath. Negative for cough and choking.   Cardiovascular: Negative for chest pain and leg swelling.  Gastrointestinal: Positive for constipation.       Objective:   Physical Exam  Filed Vitals:   05/23/15 1127  BP: 124/60  Pulse: 75  Height: 4\' 11"  (1.499 m)  Weight: 119 lb 3.2 oz (54.069 kg)  SpO2: 96%   4 L nasal cannula  Gen: chronically ill appearing, tearful HEENT: NCAT, OP clear, nasopharynx clear PULM:  No wheezing, crackles in bases noted CV: RRR, systolic murmur noted, no JVD AB: BS+, soft, nontender Ext: Left leg excoriated rash improved since the last  exam  150 pack year smoking history, quit 2001 05/2011 PFT [facility] >> Ratio 61%, FEV1 1.43 L 91% pred TLC 4.05 L 106% pred DLCO 41% pred 06/2011 Overnight oximetry >> 96% of the time her O2 saturation was between 80 and 90% (94% under 88%) 06/2011 CT Chest [facility]>> marked emphysema and basilar fibrosis  08/2011 started on 3L O2 with exertion 08/2011 6MW >> 750 ft., O2 saturation 79% on exercise;  09/2011 MMRC 3 11/2011 Pulm rehab [facility] 01/2012 6MW 1010 ft, O2 sat 90% nadir 3L 05/2012 IgE > normal 05/2012 sputum AFB >> 05/2012 sputum fungal >> c. Albicans 07/2012 CT chest >> bilateral severe emphysema; question of honeycombing and interstitial  thickening in the bases, not clearly UIP: [name] read> no significant progression since the 06/2011 study 12/2012 PFT> ratio 65%, FEV1 1.29 L (85% pred), no change with BD, TLC 3.52L (93% pred), DLCO 6.2 (36% pred) 12/2012 ILD serology panel negative\ February 2015 barium swallow> mild narrowing of the lower cervical esophagus which results in transient obstruction of the 12 mm barium pill, high grade narrowing of the distal esophagus just proximal to a moderate sized partially reducible hiatal hernia. Maximal measured luminal diameter is 4 mm. 12 mm barium pill would not pass. 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 02/2013 Echo> Normal LVEF, mild LVH and diastolic dysfunction; normal RV and RVSP 03/2013 rejected for consideration for lung transplant [facility] (dysphagia) January 2016 6 minute walk> 299 feet, could only walk 3 minutes, O2 saturation try to 84% on 3 L nasal cannula December 2015 full pulmonary function test> ratio 64%, FEV1 1.22 L (82% predicted), total lung capacity 3.10 L (82% predicted), DLCO 4.7 (28% predicted). October 2016 full pulmonary function testing ratio 68%, FEV1 1.46 L (86% predicted), FVC 2.14 L (94% predicted), DLCO 7.85 (44% predicted).      Assessment & Plan:   Dysphagia Kathryn Ruiz is not doing well. She has had severe worsening of her dysphagia along with nausea and vomiting in the last several weeks. She currently is showing signs of fatigue, she's noted significant constipation in oliguria. I'm very concerned about her current condition.  The best approach would be to try to treat the dysphagia again (esophageal stricture) but given her advanced lung disease this may not be feasible. I also think that tube feeding would be of limited benefit considering the severity of her chronic lung disease. I explained this to her family today at length.  Plan: Admit to the hospital Barium swallow GI consult Start IV fluids Palliative care consult to help with assistance in  managing goals of care.  Chronic hypoxemic respiratory failure Continue oxygen as prescribed  Pulmonary fibrosis (HCC) She has advanced lung disease, primarily emphysema with an underlying interstitial lung disease as well. Her dyspnea has been progressing over the last year and we started morphine in the last 6 months for this. In the last visit I encouraged her and her husband to consider hospice but they were not ready at that time. Overall, her prognosis is poor. Again today I brought up the concept of hospice and it's clear that they have not thought about this. They're just focused on her current symptoms as well as other things going on in the family. I'm going to asked palliative medicine to help establish goals of care.   Greater than 50% of time in a 25 minute visit spent face-to-face today.  Updated Medication List Outpatient Encounter Prescriptions as of 05/23/2015  Medication Sig  . diphenhydrAMINE (BENADRYL) 25 MG tablet  Take 50 mg by mouth at bedtime as needed for sleep.  . fluticasone (VERAMYST) 27.5 MCG/SPRAY nasal spray Place 2 sprays into the nose daily.  . furosemide (LASIX) 20 MG tablet TAKE ONE (1) TABLET EACH DAY  . guaiFENesin-codeine (CHERATUSSIN AC) 100-10 MG/5ML syrup Take 5 mLs by mouth 3 (three) times daily as needed for cough.  Marland Kitchen lisinopril (PRINIVIL,ZESTRIL) 10 MG tablet Take 1 tablet (10 mg total) by mouth daily.  . megestrol (MEGACE ES) 625 MG/5ML suspension Take 5 mLs (625 mg total) by mouth daily.  Marland Kitchen morphine 20 MG/5ML solution Take 1.3 mLs (5.2 mg total) by mouth every 4 (four) hours as needed (shortness of breath).  . pantoprazole (PROTONIX) 40 MG tablet Take 1 tablet (40 mg total) by mouth daily.  Marland Kitchen Umeclidinium-Vilanterol (ANORO ELLIPTA) 62.5-25 MCG/INH AEPB Inhale 1 puff into the lungs daily.  . Pirfenidone 267 MG CAPS Take 3 capsules by mouth 3 (three) times daily. (Patient not taking: Reported on 05/23/2015)  . potassium chloride (KLOR-CON) 20 MEQ  packet Take 20 mEq by mouth daily. Reported on 05/23/2015  . zolpidem (AMBIEN CR) 6.25 MG CR tablet Take 1 tablet (6.25 mg total) by mouth at bedtime as needed for sleep.  . [DISCONTINUED] amitriptyline (ELAVIL) 10 MG tablet Take three tablets every day. (Patient not taking: Reported on 05/23/2015)  . [DISCONTINUED] DULoxetine (CYMBALTA) 30 MG capsule Take 2 capsules daily (Patient not taking: Reported on 05/23/2015)   No facility-administered encounter medications on file as of 05/23/2015.

## 2015-05-23 NOTE — Assessment & Plan Note (Signed)
Continue oxygen as prescribed 

## 2015-05-23 NOTE — Assessment & Plan Note (Signed)
She has advanced lung disease, primarily emphysema with an underlying interstitial lung disease as well. Her dyspnea has been progressing over the last year and we started morphine in the last 6 months for this. In the last visit I encouraged her and her husband to consider hospice but they were not ready at that time. Overall, her prognosis is poor. Again today I brought up the concept of hospice and it's clear that they have not thought about this. They're just focused on her current symptoms as well as other things going on in the family. I'm going to asked palliative medicine to help establish goals of care.

## 2015-05-24 DIAGNOSIS — R0600 Dyspnea, unspecified: Secondary | ICD-10-CM

## 2015-05-24 DIAGNOSIS — N179 Acute kidney failure, unspecified: Principal | ICD-10-CM

## 2015-05-24 DIAGNOSIS — Z7189 Other specified counseling: Secondary | ICD-10-CM

## 2015-05-24 DIAGNOSIS — E43 Unspecified severe protein-calorie malnutrition: Secondary | ICD-10-CM

## 2015-05-24 DIAGNOSIS — R634 Abnormal weight loss: Secondary | ICD-10-CM

## 2015-05-24 DIAGNOSIS — J449 Chronic obstructive pulmonary disease, unspecified: Secondary | ICD-10-CM

## 2015-05-24 DIAGNOSIS — J8410 Pulmonary fibrosis, unspecified: Secondary | ICD-10-CM

## 2015-05-24 DIAGNOSIS — K5909 Other constipation: Secondary | ICD-10-CM

## 2015-05-24 LAB — BASIC METABOLIC PANEL
ANION GAP: 9 (ref 5–15)
BUN: 54 mg/dL — ABNORMAL HIGH (ref 6–20)
CALCIUM: 9 mg/dL (ref 8.9–10.3)
CO2: 21 mmol/L — ABNORMAL LOW (ref 22–32)
CREATININE: 1.91 mg/dL — AB (ref 0.44–1.00)
Chloride: 108 mmol/L (ref 101–111)
GFR, EST AFRICAN AMERICAN: 29 mL/min — AB (ref >60)
GFR, EST NON AFRICAN AMERICAN: 25 mL/min — AB (ref >60)
Glucose, Bld: 119 mg/dL — ABNORMAL HIGH (ref 65–99)
Potassium: 5.3 mmol/L — ABNORMAL HIGH (ref 3.5–5.1)
SODIUM: 138 mmol/L (ref 135–145)

## 2015-05-24 LAB — CBC
HCT: 33.5 % — ABNORMAL LOW (ref 36.0–46.0)
HEMOGLOBIN: 10.9 g/dL — AB (ref 12.0–15.0)
MCH: 27.5 pg (ref 26.0–34.0)
MCHC: 32.5 g/dL (ref 30.0–36.0)
MCV: 84.6 fL (ref 78.0–100.0)
PLATELETS: 294 10*3/uL (ref 150–400)
RBC: 3.96 MIL/uL (ref 3.87–5.11)
RDW: 14.2 % (ref 11.5–15.5)
WBC: 11.6 10*3/uL — ABNORMAL HIGH (ref 4.0–10.5)

## 2015-05-24 LAB — GLUCOSE, CAPILLARY: GLUCOSE-CAPILLARY: 132 mg/dL — AB (ref 65–99)

## 2015-05-24 MED ORDER — BISACODYL 10 MG RE SUPP
10.0000 mg | Freq: Once | RECTAL | Status: AC
  Administered 2015-05-24: 10 mg via RECTAL
  Filled 2015-05-24: qty 1

## 2015-05-24 MED ORDER — METOCLOPRAMIDE HCL 5 MG/5ML PO SOLN
5.0000 mg | Freq: Three times a day (TID) | ORAL | Status: DC
  Administered 2015-05-24: 5 mg via ORAL
  Administered 2015-05-25: 10 mg via ORAL
  Administered 2015-05-25 – 2015-05-26 (×4): 5 mg via ORAL
  Filled 2015-05-24 (×11): qty 5

## 2015-05-24 MED ORDER — MORPHINE SULFATE 10 MG/5ML PO SOLN
5.0000 mg | ORAL | Status: DC
  Administered 2015-05-24 (×2): 5 mg via ORAL
  Filled 2015-05-24 (×2): qty 5

## 2015-05-24 MED ORDER — METOCLOPRAMIDE HCL 5 MG/5ML PO SOLN: 10.0000 mg | Freq: Three times a day (TID) | ORAL | Status: DC

## 2015-05-24 MED ORDER — ACETAMINOPHEN 325 MG PO TABS
650.0000 mg | ORAL_TABLET | Freq: Four times a day (QID) | ORAL | Status: DC | PRN
  Administered 2015-05-24: 650 mg via ORAL
  Filled 2015-05-24: qty 2

## 2015-05-24 MED ORDER — OXYCODONE HCL 20 MG/ML PO CONC
5.0000 mg | ORAL | Status: DC
  Administered 2015-05-24 – 2015-05-26 (×11): 5 mg via ORAL
  Filled 2015-05-24 (×11): qty 1

## 2015-05-24 NOTE — Progress Notes (Signed)
Name: Kathryn Ruiz MRN: [id number] DOB: Sep 01, 1940    ADMISSION DATE:  05/23/15    CHIEF COMPLAINT:  Cant eat.   BRIEF PATIENT DESCRIPTION: 75 yo female former heavy smoker with Severe COPD /Emphysema , Pulmonary Fibrosis on home O2, admitted from office with severe dysphagia, suspected dehydration and FTT.   SIGNIFICANT EVENTS  4/28 Admit from office  4/28 GI consult  4/28 Palliative Care consult   STUDIES:  4/28 barium swallow > didn't drink very much but no clear esophageal obstruction seen   Background information on lung disease:  150 pack year smoking history, quit 2001 05/2011 PFT [facility] >> Ratio 61%, FEV1 1.43 L 91% pred TLC 4.05 L 106% pred DLCO 41% pred 06/2011 Overnight oximetry >> 96% of the time her O2 saturation was between 80 and 90% (94% under 88%) 06/2011 CT Chest [facility]>> marked emphysema and basilar fibrosis  08/2011 started on 3L O2 with exertion 08/2011 6MW >> 750 ft., O2 saturation 79% on exercise;  09/2011 [facility] 3 11/2011 Pulm rehab [facility] 01/2012 6MW 1010 ft, O2 sat 90% nadir 3L 05/2012 IgE > normal 05/2012 sputum AFB >> 05/2012 sputum fungal >> c. Albicans 07/2012 CT chest >> bilateral severe emphysema; question of honeycombing and interstitial thickening in the bases, not clearly UIP: [place] read> no significant progression since the 06/2011 study 12/2012 PFT> ratio 65%, FEV1 1.29 L (85% pred), no change with BD, TLC 3.52L (93% pred), DLCO 6.2 (36% pred) 12/2012 ILD serology panel negative\ February 2015 barium swallow> mild narrowing of the lower cervical esophagus which results in transient obstruction of the 12 mm barium pill, high grade narrowing of the distal esophagus just proximal to a moderate sized partially reducible hiatal hernia. Maximal measured luminal diameter is 4 mm. 12 mm barium pill would not pass. 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 02/2013 Echo> Normal LVEF, mild LVH and diastolic dysfunction; normal RV and RVSP 03/2013 rejected for  consideration for lung transplant [facility] (dysphagia) January 2016 6 minute walk> 299 feet, could only walk 3 minutes, O2 saturation try to 84% on 3 L nasal cannula December 2015 full pulmonary function test> ratio 64%, FEV1 1.22 L (82% predicted), total lung capacity 3.10 L (82% predicted), DLCO 4.7 (28% predicted). October 2016 full pulmonary function testing ratio 68%, FEV1 1.46 L (86% predicted), FVC 2.14 L (94% predicted), DLCO 7.85 (44% predicted).   Subjective:  Complains of pain> headache, back pain, leg pain Multiple calls/lengthy email from daughter to Dr. Lake Bells at 1:44 AM regarding morphine dosing Ate a few bites of jello this morning  REVIEW OF SYSTEMS:   Constipation + No fever, chills Headache +    VITAL SIGNS: Temp:  [97.9 F (36.6 C)-98.6 F (37 C)] 97.9 F (36.6 C) (04/29 0539) Pulse Rate:  [57-75] 57 (04/29 0539) Resp:  [14] 14 (04/29 0539) BP: (95-124)/(57-93) 95/57 mmHg (04/29 0539) SpO2:  [93 %-100 %] 93 % (04/29 0841) Weight:  [53.524 kg (118 lb)-54.069 kg (119 lb 3.2 oz)] 53.524 kg (118 lb) (04/28 1355)  PHYSICAL EXAMINATION: General:  Frail, sitting up on side of bed HENT: NCAT OP clear PULM: CTA, no wheezing, few crackles bases CV: RRR, no mgr GI: BS+, soft, nontender Derm: normal bulk and tone Neuro: Awake, alert, following commands   Recent Labs Lab 05/23/15 1354 05/24/15 0336  NA 137 138  K 5.4* 5.3*  CL 105 108  CO2 19* 21*  BUN 63* 54*  CREATININE 2.07* 1.91*  GLUCOSE 95 119*    Recent  Labs Lab 05/23/15 1354 05/24/15 0336  HGB 11.9* 10.9*  HCT 37.2 33.5*  WBC 11.3* 11.6*  PLT 309 294   X-ray Chest Pa And Lateral  05/23/2015  CLINICAL DATA:  Emphysema, dyspnea, loss of appetite, COPD EXAM: CHEST  2 VIEW COMPARISON:  08/04/2012 FINDINGS: Cardiomediastinal silhouette is stable. Hyperinflation again noted. Stable emphysematous changes. No definite superimposed infiltrate or pulmonary edema. Osteopenia and mild degenerative changes  thoracic spine. IMPRESSION: No active cardiopulmonary disease.  Stable COPD. Electronically Signed   By: Lahoma Crocker M.D.   On: 05/23/2015 15:30   Abd 1 View (kub)  05/23/2015  CLINICAL DATA:  Dysphagia nausea vomiting and loss of appetite constipation for several weeks EXAM: ABDOMEN - 1 VIEW COMPARISON:  None. FINDINGS: No abnormally dilated loops of bowel. Mild to moderate fecal retention. No abnormal opacities. IMPRESSION: No acute findings Electronically Signed   By: Skipper Cliche M.D.   On: 05/23/2015 15:23   Dg Esophagus  05/23/2015  CLINICAL DATA:  Worsening dysphagia and anorexia. Nausea and vomiting for several weeks. Anorexia weight loss. Advanced emphysema. EXAM: ESOPHOGRAM/BARIUM SWALLOW TECHNIQUE: Single contrast examination was performed using  thin barium. FLUOROSCOPY TIME:  Fluoroscopy Time:  2 minutes 7 seconds Number of Acquired Images:  7 COMPARISON:  None. FINDINGS: Exam was technically suboptimal as patient could only swallow small amount of contrast without spitting up. No vestibular penetration or aspiration was seen during the study. Although esophagus was incompletely distended due to small swallows of barium, there is no evidence of esophageal obstruction or stricture. Intermittent tertiary contractions of the thoracic esophagus are noted. No hiatal hernia identified. IMPRESSION: Technically suboptimal exam as patient could only take small swallows of barium, with incomplete esophageal distention. No evidence of esophageal stricture or obstruction. No aspiration visualized. Esophageal dysmotility with intermittent tertiary esophageal contractions. Electronically Signed   By: Earle Gell M.D.   On: 05/23/2015 16:48    ASSESSMENT / PLAN:  1. Severe Dysphagia / Anorexia > no clear obstruction on esophogram Nausea/vomiting History of esophageal stricture Could her symptoms be morphine related? Esbriet related? Plan  GI to f/u post barium swallow D3 diet with full liquid only    PPI  Hold Esbriet  2. FTT > due to poor po intake  Plan  Palliative care consult PT consult IV fluids D51/2 NS at 50cc/h    3. COPD/Emphysema   Plan  Continue on ANORO  Albuterol Neb As needed     4. . Pulmonary Fibrosis > poorly understood, possibly aspiration related vs UIP  Plan  Esbriet is on hold    5. Chronic Hypoxic Resp Failure > stable  Plan  Titrate FiO2 to keep O2 sats >90%.  Morphine As needed  For severe dyspnea> see below  6. HTN   Plan  Hold Lisinopril for now   7. Chronic pain/opiate dependence Opiates started in 2016 for severe debilitating dyspnea which she uses every 4 hours. Daughter and patient requesting they be given more frequently > will give morphine q4h, offer, patient may refuse > monitor for over sedation  8. AKI > continue IVF  Palliative consult today: Specific requests are: 1) Please help address goals of care. She has mostly severe emphysema and some fibrosis which progressed between 2013 and 2015 (worsening PFT/oxygenation) but has been relatively stable since then.  Despite this, she has severe debilitating dyspnea.  Have suggested hospice, family doesn't seem ready for this. 2) Symptom management: she is definitely dependent on morphine since it was started in  2016.  She says it helps her dyspnea, but I worry it is contributing to her constipation and anorexia.  Is there an alternative way to treat her pain and dyspnea or would a different formulation of opiates be better?  GI and Palliative consults greatly appreciated.  Husband updated bedside 4/29  Roselie Awkward, MD [facility] Pager: [phone] Cell: [phone] After 3pm or if no response, call [phone]    05/24/2015, 9:17 AM

## 2015-05-24 NOTE — Progress Notes (Signed)
eLink Physician-Brief Progress Note Patient Name: TYNE MELOTT DOB: Jun 07, 1940 MRN: [id number]   Date of Service  05/24/2015  HPI/Events of Note  Patient c/o of headache.  Current meds not reliving pain.  Patient requesting Aleve but has creat of greater than 2.  eICU Interventions  Plan: Will try po tylenol PRN     Intervention Category Intermediate Interventions: Pain - evaluation and management  Drelyn Pistilli 05/24/2015, 1:22 AM

## 2015-05-24 NOTE — Consult Note (Signed)
Consultation Note Date: 05/24/2015   Patient Name: Kathryn Ruiz  DOB: 01/02/1941  MRN: [id number]  Age / Sex: 75 y.o., female  PCP: Arlis Porta., MD Referring Physician: Juanito Doom, MD  Reason for Consultation: Establishing goals of care and Non pain symptom management  HPI/Patient Profile: 75 y.o. female  with past medical history of heavy smoking with severe COPD/emphysema, pulmonary fibrosis on home O2, fibromyalgia, GERD, CHF, depression, and esophageal stricture status post dilatation admitted on 05/23/2015 with dehydration, dysphasia, and failure to thrive.   Clinical Assessment and Goals of Care: Met today with Kathryn Ruiz.    She reports that the most important things to her are her family, living as long as possible, and her garden.  She states that her doctors have been doing a good job explaining things to her. She understands that she has lung disease as well as esophageal dysmotility. At the same time, I do not think from talking with her that she understands the severity of her condition.  She reports that she continues to have shortness of breath that is relieved with morphine. Additionally, she reports having an increase in dysphagia over the past few months. She feels as though things get stuck and often she will have pills "get hung up." She has lost over 10 pounds in the last few weeks. She states that she often gets sick whenever she is trying to eat or drink. She denies having any abdominal pain reports that her appetite is completely gone. She does have a history of endoscopy with balloon dilatation in the past, but she reports being told by Dr. Hilarie Fredrickson that she should not have this done in the future.  She reports that she does not think she is constipated but that her last bowel movement was 2 weeks ago. She has had only small smears since that point in time.   SUMMARY OF  RECOMMENDATIONS   - Primary concern she reports today is lack of appetite and weight loss.  While I did not think that this is necessarily related to her morphine use, these can be symptoms of opioid-induced bowel dysfunction. Will plan for rotation to another opioid in order to see if this improves her symptoms. She reports taking small doses of oral liquids works best for her. We'll therefore transition to oxycodone concentrated solution 5 mg every 4 hours as needed. This would be a small increase in the total oral morphine daily equivalent, but I do not think that it will have a significantly different effect clinically and she will be able to tolerate it well.  Additionally, I'm worried there is a component of constipation. She states she does not feel she is constipated but reports last bowel movement 2 weeks ago. Her recent abdominal xray shows some stool. Will plan for suppository. If this is ineffective would recommend enema. Also talked about oral regimen and she stated that she not think she would be able to tolerate oral regiment this time. If no results with these, could consider  Relistor. - Additionally, she reports part of her nausea is related to early satiety. There is also some thought that Reglan can be helpful in treatment of symptoms of opioid-induced bowel dysfunction. We'll therefore plan for addition of Reglan 5 mg 4 times daily. - I attempted to begin discussion regarding long-term goals of care with patient this afternoon.  I'm not sure she really understands the severity of her illness. She reports that her family needs to be part of this conversation. We are planning for follow-up tomorrow at 8 AM when her husband can be present in order to continue conversation.  Code Status/Advance Care Planning:  Full code   Symptom Management:   As above  Palliative Prophylaxis:   Bowel Regimen  Psycho-social/Spiritual:   Desire for further Chaplaincy support:no  Prognosis:   <  6 months  Discharge Planning: To Be Determined      Primary Diagnoses: Present on Admission:  . FTT (failure to thrive) in adult  I have reviewed the medical record, interviewed the patient and family, and examined the patient. The following aspects are pertinent.  Past Medical History  Diagnosis Date  . Fibromyalgia   . GERD (gastroesophageal reflux disease)   . CHF (congestive heart failure) ([place])   . Esophageal stricture   . Depression   . H/O hiatal hernia   . Oxygen dependent     07-05-13 oxygen 24/7 -3 l/m daytime, 4 l/m nighttime nasally.  Marland Kitchen COPD (chronic obstructive pulmonary disease) ([facility])     Dr. Lake Bells -[facility]   Social History   Social History  . Marital Status: Married    Spouse Name: N/A  . Number of Children: 5  . Years of Education: N/A   Occupational History  . Retired     Water engineer   Social History Main Topics  . Smoking status: Former Smoker -- 3.00 packs/day for 50 years    Types: Cigarettes    Quit date: 01/26/1999  . Smokeless tobacco: Never Used  . Alcohol Use: No  . Drug Use: No  . Sexual Activity: No   Other Topics Concern  . None   Social History Narrative   Family History  Problem Relation Age of Onset  . Heart attack Father 40  . Heart disease Mother    Scheduled Meds: . arformoterol  15 mcg Nebulization BID   And  . umeclidinium bromide  1 puff Inhalation Daily  . bisacodyl  10 mg Rectal Once  . enoxaparin (LOVENOX) injection  30 mg Subcutaneous Q24H  . feeding supplement  1 Container Oral BID BM  . feeding supplement (PRO-STAT SUGAR FREE 64)  30 mL Oral BID  . metoCLOPramide  5 mg Oral TID AC & HS  . oxyCODONE  5 mg Oral Q4H  . pantoprazole (PROTONIX) IV  40 mg Intravenous Q24H  . sodium chloride flush  3 mL Intravenous Q12H   Continuous Infusions: . dextrose 5 % and 0.45% NaCl 50 mL/hr (05/24/15 0956)   PRN Meds:.sodium chloride, acetaminophen, albuterol, ondansetron **OR** ondansetron (ZOFRAN) IV,  sodium chloride flush Medications Prior to Admission:  Prior to Admission medications   Medication Sig Start Date End Date Taking? Authorizing Provider  diphenhydrAMINE (BENADRYL) 25 MG tablet Take 50 mg by mouth at bedtime as needed for sleep.   Yes Historical Provider, MD  fluticasone (VERAMYST) 27.5 MCG/SPRAY nasal spray Place 2 sprays into the nose daily as needed for rhinitis or allergies.    Yes Historical Provider, MD  furosemide (LASIX) 20 MG  tablet TAKE ONE (1) TABLET EACH DAY 12/13/14  Yes Arlis Porta., MD  guaiFENesin-codeine [facility]) 100-10 MG/5ML syrup Take 5 mLs by mouth 3 (three) times daily as needed for cough. 04/09/14  Yes Juanito Doom, MD  lisinopril (PRINIVIL,ZESTRIL) 10 MG tablet Take 1 tablet (10 mg total) by mouth daily. 11/25/14  Yes Arlis Porta., MD  morphine 20 MG/5ML solution Take 1.3 mLs (5.2 mg total) by mouth every 4 (four) hours as needed (shortness of breath). Patient taking differently: Take 5 mg by mouth every 4 (four) hours.  01/24/15  Yes Juanito Doom, MD  pantoprazole (PROTONIX) 40 MG tablet Take 1 tablet (40 mg total) by mouth daily. 08/02/14  Yes Gatha Mayer, MD  potassium chloride (KLOR-CON) 20 MEQ packet Take 20 mEq by mouth daily. Reported on 05/23/2015   Yes Historical Provider, MD  Umeclidinium-Vilanterol (ANORO ELLIPTA) 62.5-25 MCG/INH AEPB Inhale 1 puff into the lungs daily. Patient taking differently: Inhale 1 puff into the lungs daily as needed (shortness of breathe.).  10/30/14  Yes Juanito Doom, MD   No Known Allergies Review of Systems  Constitutional: Positive for activity change, appetite change, fatigue and unexpected weight change.  Respiratory: Positive for cough, shortness of breath and wheezing.   Gastrointestinal: Positive for nausea and constipation.  Musculoskeletal: Positive for back pain.     Physical Exam  General: Alert, awake, in no acute distress.  HEENT: No bruits, no goiter, no  JVD Heart: Regular rate and rhythm. No murmur appreciated. Lungs: Good air movement, clear Abdomen: Soft, nontender, nondistended, positive bowel sounds.  Ext: No significant edema Skin: Warm and dry Neuro: Grossly intact, nonfocal.  Vital Signs: BP 91/54 mmHg  Pulse 65  Temp(Src) 97.6 F (36.4 C) (Oral)  Resp 16  Ht _0  (1.499 m)  Wt 53.524 kg (118 lb)  BMI 23.82 kg/m2  SpO2 100% Pain Assessment: 0-10   Pain Score: 2    SpO2: SpO2: 100 % O2 Device:SpO2: 100 % O2 Flow Rate: .O2 Flow Rate (L/min): 4 L/min  IO: Intake/output summary:  Intake/Output Summary (Last 24 hours) at 05/24/15 2049 Last data filed at 05/24/15 1700  Gross per 24 hour  Intake   1670 ml  Output    700 ml  Net    970 ml    LBM: Last BM Date: 05/16/15 Baseline Weight: Weight: 53.524 kg (118 lb) Most recent weight: Weight: 53.524 kg (118 lb)     Palliative Assessment/Data:   Flowsheet Rows        Most Recent Value   Intake Tab    Referral Department  Pulmonary   Unit at Time of Referral  Oncology Unit   Palliative Care Primary Diagnosis  Pulmonary   Date Notified  05/23/15   Palliative Care Type  New Palliative care   Reason for referral  Clarify Goals of Care   Date of Admission  05/23/15   # of days IP prior to Palliative referral  0   Clinical Assessment    Psychosocial & Spiritual Assessment    Palliative Care Outcomes       Time In: 1630 Time Out: 1730 Time Total: 60 Greater than 50%  of this time was spent counseling and coordinating care related to the above assessment and plan.  Signed by: Micheline Rough, MD   Please contact Palliative Medicine Team phone at [phone] for questions and concerns.  For individual provider: See Shea Evans

## 2015-05-24 NOTE — Evaluation (Signed)
Physical Therapy Evaluation Patient Details Name: Kathryn Ruiz MRN: [id number] DOB: 09/23/1940 Today's Date: 05/24/2015   History of Present Illness  Pt admitted with FTT and dysphagia.  Pt with hx of fibromyalgia, CHF and COPD - O2 dependent at home  Clinical Impression  Pt admitted as above and presenting with functional mobility limitations 2* generalized weakness, ltd endurance and ambulatory balance deficits.  Pt should progress to dc home with assist of spouse.    Follow Up Recommendations No PT follow up    Equipment Recommendations  None recommended by PT    Recommendations for Other Services       Precautions / Restrictions Precautions Precautions: Fall Restrictions Weight Bearing Restrictions: No      Mobility  Bed Mobility               General bed mobility comments: Pt OOB on arrival  Transfers Overall transfer level: Needs assistance Equipment used: Rolling walker (2 wheeled) Transfers: Sit to/from Stand Sit to Stand: Min guard;Supervision         General transfer comment: min cues for transition position  Ambulation/Gait Ambulation/Gait assistance: Min guard;Supervision Ambulation Distance (Feet): 200 Feet Assistive device: Rolling walker (2 wheeled) Gait Pattern/deviations: Step-through pattern;Shuffle;Trunk flexed     General Gait Details: min cues for posture and position from [employer]            Wheelchair Mobility    Modified Rankin (Stroke Patients Only)       Balance Overall balance assessment: Needs assistance Sitting-balance support: No upper extremity supported;Feet supported Sitting balance-Leahy Scale: Good     Standing balance support: No upper extremity supported Standing balance-Leahy Scale: Fair                               Pertinent Vitals/Pain Pain Assessment: No/denies pain    Home Living Family/patient expects to be discharged to:: Private residence Living Arrangements:  Spouse/significant other Available Help at Discharge: Family Type of Home: House Home Access: Level entry     Home Layout: One level Home Equipment: Environmental consultant - 4 wheels      Prior Function Level of Independence: Needs assistance   Gait / Transfers Assistance Needed: 4wh RW     Comments: Spouse assists as needed     Hand Dominance        Extremity/Trunk Assessment   Upper Extremity Assessment: Generalized weakness           Lower Extremity Assessment: Generalized weakness      Cervical / Trunk Assessment: Kyphotic  Communication   Communication: No difficulties  Cognition Arousal/Alertness: Awake/alert Behavior During Therapy: WFL for tasks assessed/performed Overall Cognitive Status: Within Functional Limits for tasks assessed                      General Comments      Exercises        Assessment/Plan    PT Assessment Patient needs continued PT services  PT Diagnosis Difficulty walking   PT Problem List Decreased strength;Decreased activity tolerance;Decreased mobility;Decreased balance  PT Treatment Interventions DME instruction;Gait training;Stair training;Functional mobility training;Therapeutic activities;Therapeutic exercise;Balance training;Patient/family education   PT Goals (Current goals can be found in the Care Plan section) Acute Rehab PT Goals Patient Stated Goal: HOME PT Goal Formulation: With patient Time For Goal Achievement: 05/31/15 Potential to Achieve Goals: Good    Frequency Min 3X/week   Barriers to discharge  Co-evaluation               End of Session Equipment Utilized During Treatment: Oxygen Activity Tolerance: Patient tolerated treatment well Patient left: Other (comment) (sitting EOB) Nurse Communication: Mobility status         Time: [id number] PT Time Calculation (min) (ACUTE ONLY): 12 min   Charges:   PT Evaluation $PT Eval Low Complexity: 1 Procedure     PT G Codes:         Deontrae Drinkard 05/24/2015, 12:58 PM

## 2015-05-24 NOTE — Progress Notes (Addendum)
Patient ID: Kathryn Ruiz, female   DOB: 10/31/1940, 75 y.o.   MRN: [id number]    Progress Note   Subjective   Sitting up on side of bed sipping iced milk- going down if drinks slowly  Ba Swallow- somewhat limited as only took mall sips- no stricture seem some tertiary contractions   Objective   Vital signs in last 24 hours: Temp:  [97.9 F (36.6 C)-98.6 F (37 C)] 97.9 F (36.6 C) (04/29 0539) Pulse Rate:  [57-75] 57 (04/29 0539) Resp:  [14] 14 (04/29 0539) BP: (95-116)/(57-93) 95/57 mmHg (04/29 0539) SpO2:  [93 %-100 %] 93 % (04/29 0841) Weight:  [118 lb (53.524 kg)] 118 lb (53.524 kg) (04/28 1355) Last BM Date:  (PTA) General:  Older   white female in NAD Heart:  Regular rate and rhythm; no murmurs Lungs: Respirations even and unlabored, lungs decreased bilaterally Abdomen:  Soft, nontender and nondistended. Normal bowel sounds. Extremities:  Without edema. Neurologic:  Alert and oriented,  grossly normal neurologically. Psych:  Cooperative. Normal mood and affect.  Intake/Output from previous day: 04/28 0701 - 04/29 0700 In: 640 [P.O.:640] Out: 1050 [Urine:1050] Intake/Output this shift: Total I/O In: 480 [P.O.:480] Out: 200 [Urine:200]  Lab Results:  Recent Labs  05/23/15 1354 05/24/15 0336  WBC 11.3* 11.6*  HGB 11.9* 10.9*  HCT 37.2 33.5*  PLT 309 294   BMET  Recent Labs  05/23/15 1354 05/24/15 0336  NA 137 138  K 5.4* 5.3*  CL 105 108  CO2 19* 21*  GLUCOSE 95 119*  BUN 63* 54*  CREATININE 2.07* 1.91*  CALCIUM 9.3 9.0   LFT  Recent Labs  05/23/15 1354  PROT 7.8  ALBUMIN 3.9  AST 18  ALT 11*  ALKPHOS 91  BILITOT 0.5   PT/INR No results for input(s): LABPROT, INR in the last 72 hours.  Studies/Results: X-ray Chest Pa And Lateral  05/23/2015  CLINICAL DATA:  Emphysema, dyspnea, loss of appetite, COPD EXAM: CHEST  2 VIEW COMPARISON:  08/04/2012 FINDINGS: Cardiomediastinal silhouette is stable. Hyperinflation again noted. Stable  emphysematous changes. No definite superimposed infiltrate or pulmonary edema. Osteopenia and mild degenerative changes thoracic spine. IMPRESSION: No active cardiopulmonary disease.  Stable COPD. Electronically Signed   By: Lahoma Crocker M.D.   On: 05/23/2015 15:30   Abd 1 View (kub)  05/23/2015  CLINICAL DATA:  Dysphagia nausea vomiting and loss of appetite constipation for several weeks EXAM: ABDOMEN - 1 VIEW COMPARISON:  None. FINDINGS: No abnormally dilated loops of bowel. Mild to moderate fecal retention. No abnormal opacities. IMPRESSION: No acute findings Electronically Signed   By: Skipper Cliche M.D.   On: 05/23/2015 15:23   Dg Esophagus  05/23/2015  CLINICAL DATA:  Worsening dysphagia and anorexia. Nausea and vomiting for several weeks. Anorexia weight loss. Advanced emphysema. EXAM: ESOPHOGRAM/BARIUM SWALLOW TECHNIQUE: Single contrast examination was performed using  thin barium. FLUOROSCOPY TIME:  Fluoroscopy Time:  2 minutes 7 seconds Number of Acquired Images:  7 COMPARISON:  None. FINDINGS: Exam was technically suboptimal as patient could only swallow small amount of contrast without spitting up. No vestibular penetration or aspiration was seen during the study. Although esophagus was incompletely distended due to small swallows of barium, there is no evidence of esophageal obstruction or stricture. Intermittent tertiary contractions of the thoracic esophagus are noted. No hiatal hernia identified. IMPRESSION: Technically suboptimal exam as patient could only take small swallows of barium, with incomplete esophageal distention. No evidence of esophageal stricture or obstruction. No  aspiration visualized. Esophageal dysmotility with intermittent tertiary esophageal contractions. Electronically Signed   By: Earle Gell M.D.   On: 05/23/2015 16:48       Assessment / Plan:    #1 75 yo WF with severe COPD/pulm fibrosis on 4l nasal  02  Chronically #2 dysphagia/anorexia/wt loss Hx of  esophageal stricture/schatzkis ring-  No stricture or obstruction noted on barium swallow Suspect anorexia is most of issue and fear of regurgitation/etc Agree with reviewing meds carefully - Esbrient stopped today Full liquid diet Convert meds to liquid form  Consider trial of megace   Active Problems:   FTT (failure to thrive) in adult   Protein-calorie malnutrition, severe     LOS: 1 day   [name]  05/24/2015, 1:00 PM  GI ATTENDING  Interval history data reviewed. Patient seen and examined. Tough situation. No significant obstructive lesion of the esophagus.Not clear that endoscopy would be at all helpful but it is certainly risky.Her failure to thrive is most likely multifactorial. Drugs certainly a consideration. End-stage lung disease also a consideration. I truly do not feel she has a primary gastrointestinal disorder to explain things. Trial of Megace is reasonable. Will sign off, but are available for questions and relevant clinical issues. Thanks  Docia Chuck. Geri Seminole., M.D. [facility] Division of Gastroenterology

## 2015-05-25 DIAGNOSIS — R0600 Dyspnea, unspecified: Secondary | ICD-10-CM | POA: Insufficient documentation

## 2015-05-25 DIAGNOSIS — Z7189 Other specified counseling: Secondary | ICD-10-CM

## 2015-05-25 DIAGNOSIS — J849 Interstitial pulmonary disease, unspecified: Secondary | ICD-10-CM

## 2015-05-25 DIAGNOSIS — Z515 Encounter for palliative care: Secondary | ICD-10-CM

## 2015-05-25 LAB — BASIC METABOLIC PANEL
Anion gap: 9 (ref 5–15)
BUN: 50 mg/dL — AB (ref 6–20)
CO2: 22 mmol/L (ref 22–32)
Calcium: 8.9 mg/dL (ref 8.9–10.3)
Chloride: 109 mmol/L (ref 101–111)
Creatinine, Ser: 2.15 mg/dL — ABNORMAL HIGH (ref 0.44–1.00)
GFR, EST AFRICAN AMERICAN: 25 mL/min — AB (ref >60)
GFR, EST NON AFRICAN AMERICAN: 21 mL/min — AB (ref >60)
Glucose, Bld: 127 mg/dL — ABNORMAL HIGH (ref 65–99)
POTASSIUM: 4.9 mmol/L (ref 3.5–5.1)
SODIUM: 140 mmol/L (ref 135–145)

## 2015-05-25 LAB — CBC WITH DIFFERENTIAL/PLATELET
BASOS ABS: 0 10*3/uL (ref 0.0–0.1)
Basophils Relative: 0 %
EOS ABS: 0.5 10*3/uL (ref 0.0–0.7)
EOS PCT: 4 %
HCT: 31.4 % — ABNORMAL LOW (ref 36.0–46.0)
Hemoglobin: 10 g/dL — ABNORMAL LOW (ref 12.0–15.0)
LYMPHS PCT: 15 %
Lymphs Abs: 1.7 10*3/uL (ref 0.7–4.0)
MCH: 26.7 pg (ref 26.0–34.0)
MCHC: 31.8 g/dL (ref 30.0–36.0)
MCV: 83.7 fL (ref 78.0–100.0)
Monocytes Absolute: 1.3 10*3/uL — ABNORMAL HIGH (ref 0.1–1.0)
Monocytes Relative: 12 %
NEUTROS PCT: 69 %
Neutro Abs: 7.6 10*3/uL (ref 1.7–7.7)
Platelets: 272 10*3/uL (ref 150–400)
RBC: 3.75 MIL/uL — AB (ref 3.87–5.11)
RDW: 14 % (ref 11.5–15.5)
WBC: 11.2 10*3/uL — AB (ref 4.0–10.5)

## 2015-05-25 LAB — GLUCOSE, CAPILLARY: Glucose-Capillary: 99 mg/dL (ref 65–99)

## 2015-05-25 NOTE — Progress Notes (Signed)
Name: Kathryn Ruiz MRN: [id number] DOB: 1940/09/07    ADMISSION DATE:  05/23/15    CHIEF COMPLAINT:  Cant eat.   BRIEF PATIENT DESCRIPTION: 75 yo female former heavy smoker with Severe COPD /Emphysema , Pulmonary Fibrosis on home O2, admitted from office with severe dysphagia, suspected dehydration and FTT.   SIGNIFICANT EVENTS  4/28 Admit from office  4/28 GI consult  4/28 Palliative Care consult   STUDIES:  4/28 barium swallow > didn't drink very much but no clear esophageal obstruction seen   Background information on lung disease:  150 pack year smoking history, quit 2001 05/2011 PFT [facility] >> Ratio 61%, FEV1 1.43 L 91% pred TLC 4.05 L 106% pred DLCO 41% pred 06/2011 Overnight oximetry >> 96% of the time her O2 saturation was between 80 and 90% (94% under 88%) 06/2011 CT Chest [facility]>> marked emphysema and basilar fibrosis  08/2011 started on 3L O2 with exertion 08/2011 6MW >> 750 ft., O2 saturation 79% on exercise;  09/2011 [facility] 3 11/2011 Pulm rehab [facility] 01/2012 6MW 1010 ft, O2 sat 90% nadir 3L 05/2012 IgE > normal 05/2012 sputum AFB >> 05/2012 sputum fungal >> c. Albicans 07/2012 CT chest >> bilateral severe emphysema; question of honeycombing and interstitial thickening in the bases, not clearly UIP: [place] read> no significant progression since the 06/2011 study 12/2012 PFT> ratio 65%, FEV1 1.29 L (85% pred), no change with BD, TLC 3.52L (93% pred), DLCO 6.2 (36% pred) 12/2012 ILD serology panel negative\ February 2015 barium swallow> mild narrowing of the lower cervical esophagus which results in transient obstruction of the 12 mm barium pill, high grade narrowing of the distal esophagus just proximal to a moderate sized partially reducible hiatal hernia. Maximal measured luminal diameter is 4 mm. 12 mm barium pill would not pass. 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 02/2013 Echo> Normal LVEF, mild LVH and diastolic dysfunction; normal RV and RVSP 03/2013 rejected for  consideration for lung transplant [facility] (dysphagia) January 2016 6 minute walk> 299 feet, could only walk 3 minutes, O2 saturation try to 84% on 3 L nasal cannula December 2015 full pulmonary function test> ratio 64%, FEV1 1.22 L (82% predicted), total lung capacity 3.10 L (82% predicted), DLCO 4.7 (28% predicted). October 2016 full pulmonary function testing ratio 68%, FEV1 1.46 L (86% predicted), FVC 2.14 L (94% predicted), DLCO 7.85 (44% predicted).   Subjective:  Saw palliative medicine and GI yesterday Feels better today Walked yesterday  REVIEW OF SYSTEMS:   Constipation persists No fever, chills Headache gone Dyspnea baseline    VITAL SIGNS: Temp:  [97.6 F (36.4 C)-98 F (36.7 C)] 97.9 F (36.6 C) (04/30 0511) Pulse Rate:  [61-77] 61 (04/30 0511) Resp:  [14-16] 14 (04/30 0511) BP: (66-128)/(40-67) 93/67 mmHg (04/30 0547) SpO2:  [97 %-100 %] 97 % (04/30 0807)  PHYSICAL EXAMINATION: General:  Frail, sitting up on side of bed HENT: NCAT OP clear PULM: CTA, no wheezing, few crackles bases CV: RRR, no mgr GI: BS+, soft, nontender Derm: normal bulk and tone Neuro: Awake, alert, following commands   Recent Labs Lab 05/23/15 1354 05/24/15 0336 05/25/15 0355  NA 137 138 140  K 5.4* 5.3* 4.9  CL 105 108 109  CO2 19* 21* 22  BUN 63* 54* 50*  CREATININE 2.07* 1.91* 2.15*  GLUCOSE 95 119* 127*    Recent Labs Lab 05/23/15 1354 05/24/15 0336 05/25/15 0355  HGB 11.9* 10.9* 10.0*  HCT 37.2 33.5* 31.4*  WBC 11.3* 11.6* 11.2*  PLT  309 294 272   X-ray Chest Pa And Lateral  05/23/2015  CLINICAL DATA:  Emphysema, dyspnea, loss of appetite, COPD EXAM: CHEST  2 VIEW COMPARISON:  08/04/2012 FINDINGS: Cardiomediastinal silhouette is stable. Hyperinflation again noted. Stable emphysematous changes. No definite superimposed infiltrate or pulmonary edema. Osteopenia and mild degenerative changes thoracic spine. IMPRESSION: No active cardiopulmonary disease.  Stable COPD.  Electronically Signed   By: Lahoma Crocker M.D.   On: 05/23/2015 15:30   Abd 1 View (kub)  05/23/2015  CLINICAL DATA:  Dysphagia nausea vomiting and loss of appetite constipation for several weeks EXAM: ABDOMEN - 1 VIEW COMPARISON:  None. FINDINGS: No abnormally dilated loops of bowel. Mild to moderate fecal retention. No abnormal opacities. IMPRESSION: No acute findings Electronically Signed   By: Skipper Cliche M.D.   On: 05/23/2015 15:23   Dg Esophagus  05/23/2015  CLINICAL DATA:  Worsening dysphagia and anorexia. Nausea and vomiting for several weeks. Anorexia weight loss. Advanced emphysema. EXAM: ESOPHOGRAM/BARIUM SWALLOW TECHNIQUE: Single contrast examination was performed using  thin barium. FLUOROSCOPY TIME:  Fluoroscopy Time:  2 minutes 7 seconds Number of Acquired Images:  7 COMPARISON:  None. FINDINGS: Exam was technically suboptimal as patient could only swallow small amount of contrast without spitting up. No vestibular penetration or aspiration was seen during the study. Although esophagus was incompletely distended due to small swallows of barium, there is no evidence of esophageal obstruction or stricture. Intermittent tertiary contractions of the thoracic esophagus are noted. No hiatal hernia identified. IMPRESSION: Technically suboptimal exam as patient could only take small swallows of barium, with incomplete esophageal distention. No evidence of esophageal stricture or obstruction. No aspiration visualized. Esophageal dysmotility with intermittent tertiary esophageal contractions. Electronically Signed   By: Earle Gell M.D.   On: 05/23/2015 16:48    ASSESSMENT / PLAN:  1. Severe Dysphagia / Anorexia > no clear obstruction on esophogram; I worry the Esbriet is a big factor here leading to anorexia History of esophageal stricture Plan  Hold Esbriet indefinitely D3 diet with full liquid only  PPI   2. FTT > due to poor po intake  Plan  Continue oxycodone per palliative  medicine PT consult  3. AKI  Plan: Increase IVF to 75cc/hr Repeat BMET in AM At discharge would use lasix prn only  4. COPD/Emphysema   Plan  Continue on ANORO  Albuterol Neb as needed     5. . Pulmonary Fibrosis > poorly understood, possibly aspiration related vs UIP  Plan  Esbriet is on hold    6. Chronic Hypoxic Resp Failure > stable  Plan  Titrate FiO2 to keep O2 sats >90%.  Morphine As needed  For severe dyspnea> see below  7. HTN   Plan  Hold Lisinopril for now   8. Chronic pain/opiate dependence Continue oxycodone per palliative recs monitor for over sedation  Dispo:  Plan home hospice this week  GI and Palliative consults greatly appreciated.  Husband and daughter updated bedside 4/30  Roselie Awkward, MD [facility] Pager: [phone] Cell: [phone] After 3pm or if no response, call [phone]    05/25/2015, 9:59 AM

## 2015-05-25 NOTE — Care Management Note (Signed)
Case Management Note  Patient Details  Name: Kathryn Ruiz MRN: [id number] Date of Birth: 04/20/1940  Subjective/Objective:     advanced emphysema                Action/Plan: Discharge Planning:  NCM spoke to pt and husband at bedside. Offered choice for Home Hospice/provided list. Pt requested [facility], # [phone] fax [phone]. Faxed referral to Hospice. Contacted referral center. Waiting call back. Will have NCM fax dc summary on 05/26/2015 and follow up with Hospice on acceptance of referral. Pt has oxygen at home. Will need small manual wheelchair. Explained to pt Hospice will arrange DME for home.   PCP- Dicky Doe  MD  Expected Discharge Date: 05/26/2015              Expected Discharge Plan:  [place]  In-House Referral:  NA  Discharge planning Services  CM Consult  Post Acute Care Choice:  Hospice Choice offered to:  Patient, Spouse  DME Arranged:  N/A DME Agency:  NA  HH Arranged:  RN [place] Agency:  [facility]  Status of Service:  In process, will continue to follow  Medicare Important Message Given:    Date Medicare IM Given:    Medicare IM give by:    Date Additional Medicare IM Given:    Additional Medicare Important Message give by:     If discussed at [place] of Stay Meetings, dates discussed:    Additional Comments:  Erenest Rasher, RN 05/25/2015, 5:37 PM

## 2015-05-25 NOTE — Progress Notes (Signed)
Daily Progress Note   Patient Name: Kathryn Ruiz       Date: 05/25/2015 DOB: [birth year]  Age: 75 y.o. MRN#: [id number] Attending Physician: Juanito Doom, MD Primary Care Physician: Dicky Doe, MD Admit Date: 05/23/2015  Reason for Consultation/Follow-up: Disposition, Establishing goals of care, Non pain symptom management and Pain control  Subjective: Met this morning with Kathryn Ruiz, Kathryn Ruiz, Kathryn Ruiz, and Kathryn Ruiz.  We discussed again what is important to Kathryn and Kathryn long-term goal of feeling as well as possible presence long as possible, being at home, staying out of the hospital, spending time with family, and being able to spend time in Kathryn garden.  We also talked about the chronic nature of Kathryn medical condition and the fact that she has been having changes in Kathryn nutrition and functional status over the past several weeks to months. We discussed nutrition and how she has not been feeling hungry and this is been difficult for Kathryn and Kathryn family to process emotionally.  We discussed that the hospital can be useful as long as she is getting well enough from care she receives at the hospital to enjoy time at home, but we have reached a place where, if Kathryn goal is to be at home, she may be better served to plan on being at home and bringing care to Kathryn at home rather repeated trips to the hospital. We discussed hospice as a tool that may be beneficial in this goal  We have reached a point where we are trying to fix problems that are not fixable and Kathryn goal is to remain at home and live as well as possible understanding that Kathryn time is limited by the progression of Kathryn chronic illnesses.  Kathryn family was very open to this conversation today. They report that they would  like for Kathryn to be discharged as soon as she is medically stable to home with hospice support. They live in [place] and noted Kathryn Ruiz is a hospice that they were familiar with.   Length of Stay: 2  Current Medications: Scheduled Meds:  . arformoterol  15 mcg Nebulization BID   And  . umeclidinium bromide  1 puff Inhalation Daily  . enoxaparin (LOVENOX) injection  30 mg Subcutaneous Q24H  . feeding supplement  1 Container Oral  BID BM  . feeding supplement (PRO-STAT SUGAR FREE 64)  30 mL Oral BID  . metoCLOPramide  5 mg Oral TID AC & HS  . oxyCODONE  5 mg Oral Q4H  . pantoprazole (PROTONIX) IV  40 mg Intravenous Q24H  . sodium chloride flush  3 mL Intravenous Q12H    Continuous Infusions: . dextrose 5 % and 0.45% NaCl 50 mL/hr (05/24/15 0956)    PRN Meds: sodium chloride, acetaminophen, albuterol, ondansetron **OR** ondansetron (ZOFRAN) IV, sodium chloride flush  Physical Exam      General: Alert, awake, in no acute distress. Frail.  HEENT: No bruits, no goiter, no JVD Heart: Regular rate and rhythm. No murmur appreciated. Lungs: Fair air movement, clear other than some crackles at bases Abdomen: Soft, nontender, nondistended, positive bowel sounds.  Ext: No significant edema Skin: Warm and dry Neuro: Grossly intact, nonfocal.       Vital Signs: BP 93/67 mmHg  Pulse 61  Temp(Src) 97.9 F (36.6 C) (Oral)  Resp 14  Ht '4\' 11"'$  (1.499 m)  Wt 53.524 kg (118 lb)  BMI 23.82 kg/m2  SpO2 97% SpO2: SpO2: 97 % O2 Device: O2 Device: Nasal Cannula O2 Flow Rate: O2 Flow Rate (L/min): 4 L/min  Intake/output summary:  Intake/Output Summary (Last 24 hours) at 05/25/15 1018 Last data filed at 05/25/15 0513  Gross per 24 hour  Intake   1425 ml  Output      0 ml  Net   1425 ml   LBM: Last BM Date: 05/16/15 Baseline Weight: Weight: 53.524 kg (118 lb) Most recent weight: Weight: 53.524 kg (118 lb)       Palliative Assessment/Data:    Flowsheet Rows        Most  Recent Value   Intake Tab    Referral Department  Pulmonary   Unit at Time of Referral  Oncology Unit   Palliative Care Primary Diagnosis  Pulmonary   Date Notified  05/23/15   Palliative Care Type  New Palliative care   Reason for referral  Clarify Goals of Care   Date of Admission  05/23/15   # of days IP prior to Palliative referral  0   Clinical Assessment    Psychosocial & Spiritual Assessment    Palliative Care Outcomes       Patient Active Problem List   Diagnosis Date Noted  . Loss of weight   . Dysphagia 05/23/2015  . FTT (failure to thrive) in adult 05/23/2015  . Protein-calorie malnutrition, severe 05/23/2015  . Lack of appetite 01/24/2015  . HBP (high blood pressure) 01/21/2015  . Fibromyalgia 10/18/2014  . Fatigue due to depression 06/26/2014  . Constipation 04/16/2014  . Rash and nonspecific skin eruption 04/16/2014  . Muscular deconditioning 01/30/2014  . COPD exacerbation ([place]) 12/10/2013  . Schatzki's ring 07/17/2013  . Hiatal hernia 07/17/2013  . Esophageal dysphagia 04/12/2013  . Osteopenia 02/05/2013  . Allergic rhinitis 05/30/2012  . Cough 02/07/2012  . Chronic hypoxemic respiratory failure ([place]) 08/06/2011  . COPD (chronic obstructive pulmonary disease) ([place]) 07/09/2011  . Pulmonary fibrosis ([place]) 07/09/2011  . Dyspnea on exertion 06/08/2011    Palliative Care Assessment & Plan   Patient Profile: 75 y.o. female with past medical history of heavy smoking with severe COPD/emphysema, pulmonary fibrosis on home O2, fibromyalgia, GERD, CHF, depression, and esophageal stricture status post dilatation admitted on 05/23/2015 with dehydration, dysphasia, and failure to thrive.  Recommendations/Plan: - Dysphasia/anorexia: While I did not think that this is necessarily related  to Kathryn morphine use, anorexia can be symptom of opioid-induced bowel dysfunction. Rotated to oxycodone concentrated solution 5 mg every 4 hours as needed to see if there is any  benefit. She does report feeling a little less jittery this morning. There is also some thought that Reglan can be helpful in treatment of symptoms of opioid-induced bowel dysfunction. Recommend continue trial of Reglan 5 mg 4 times daily. - Constipation: She reports bowel movement after suppository. Will need to continue to develop home bowel regimen when she is discharged with hospice support. She reports that she does not think she'll be able to take anything extra orally today for bowel regimen. - Shortness of breath: Well managed on oxycodone. Recommend continue same on discharge.  Goals of Care and Additional Recommendations:  Discussed at length with patient and Kathryn family. They are in agreement that she will be well served by transitioning home with hospice support. I placed a referral to care management to present options.  Code Status:    Code Status Orders        Start     Ordered   05/25/15 [phone]  Do not attempt resuscitation (DNR)   Continuous    Question Answer Comment  In the event of cardiac or respiratory ARREST Do not call a "code blue"   In the event of cardiac or respiratory ARREST Do not perform Intubation, CPR, defibrillation or ACLS   In the event of cardiac or respiratory ARREST Use medication by any route, position, wound care, and other measures to relive pain and suffering. May use oxygen, suction and manual treatment of airway obstruction as needed for comfort.      05/25/15 2694    Code Status History    Date Active Date Inactive Code Status Order ID Comments User Context   05/23/2015  1:25 PM 05/25/2015  9:51 AM Full Code [id number]  Melvenia Needles, NP Inpatient    Advance Directive Documentation        Most Recent Value   Type of Advance Directive  Healthcare Power of Attorney, Living will   Pre-existing out of facility DNR order (yellow form or pink MOST form)     "MOST" Form in Place?         Prognosis:   < 6 months. She has chronic progressive  lung disease and has also developed anorexia with little nutritional intake over the past several weeks to months. She is developed weight loss as well as dehydration and kidney injury related to this. If Kathryn disease continues along its natural course, I would anticipate Kathryn prognosis to be less than 6 months and she should qualify for hospice support.  Discharge Planning:  Home with Hospice  Care plan was discussed with patient, family, and Dr. Lake Bells  Thank you for allowing the Palliative Medicine Team to assist in the care of this patient.   Time In: 0815 Time Out: 0925 Total Time 70 Prolonged Time Billed yes      Greater than 50%  of this time was spent counseling and coordinating care related to the above assessment and plan.  Micheline Rough, MD  Please contact Palliative Medicine Team phone at [phone] for questions and concerns.

## 2015-05-26 ENCOUNTER — Telehealth: Payer: Self-pay | Admitting: Internal Medicine

## 2015-05-26 DIAGNOSIS — Z515 Encounter for palliative care: Secondary | ICD-10-CM

## 2015-05-26 DIAGNOSIS — N179 Acute kidney failure, unspecified: Secondary | ICD-10-CM

## 2015-05-26 DIAGNOSIS — R29898 Other symptoms and signs involving the musculoskeletal system: Secondary | ICD-10-CM

## 2015-05-26 DIAGNOSIS — J9611 Chronic respiratory failure with hypoxia: Secondary | ICD-10-CM

## 2015-05-26 DIAGNOSIS — J84112 Idiopathic pulmonary fibrosis: Secondary | ICD-10-CM

## 2015-05-26 LAB — BASIC METABOLIC PANEL
Anion gap: 8 (ref 5–15)
BUN: 36 mg/dL — ABNORMAL HIGH (ref 6–20)
CALCIUM: 8.5 mg/dL — AB (ref 8.9–10.3)
CO2: 21 mmol/L — AB (ref 22–32)
CREATININE: 1.81 mg/dL — AB (ref 0.44–1.00)
Chloride: 110 mmol/L (ref 101–111)
GFR calc non Af Amer: 26 mL/min — ABNORMAL LOW (ref >60)
GFR, EST AFRICAN AMERICAN: 31 mL/min — AB (ref >60)
GLUCOSE: 113 mg/dL — AB (ref 65–99)
Potassium: 4.9 mmol/L (ref 3.5–5.1)
Sodium: 139 mmol/L (ref 135–145)

## 2015-05-26 LAB — GLUCOSE, CAPILLARY: Glucose-Capillary: 88 mg/dL (ref 65–99)

## 2015-05-26 MED ORDER — OXYCODONE HCL 10 MG/0.5ML PO CONC: 5.0000 mg | ORAL | Status: DC | PRN

## 2015-05-26 MED ORDER — HALOPERIDOL LACTATE 2 MG/ML PO CONC
0.6000 mg | Freq: Four times a day (QID) | ORAL | Status: DC | PRN
Start: 2015-05-26 — End: 2015-05-29

## 2015-05-26 MED ORDER — METOCLOPRAMIDE HCL 5 MG/5ML PO SOLN: 5.0000 mg | Freq: Three times a day (TID) | ORAL | Status: AC

## 2015-05-26 MED ORDER — LORAZEPAM 2 MG/ML PO CONC: 1.0000 mg | ORAL | Status: DC | PRN

## 2015-05-26 NOTE — Telephone Encounter (Signed)
Called spoke with Clair Gulling at [facility]. He states that the hospice doctor has already faxed over a prescription for the medications. He states that nothing further is needed from our office.

## 2015-05-26 NOTE — Telephone Encounter (Signed)
Spoke with pharmacist at [facility], pt was discharged today from hospital with hospice and Oxycodone concentrate #46mL signed by Marni Griffon.  Pharmacist states that the smallest dispense they can do of this medication is #53mL.   Pt needs a new refill of this medication asap, can have a hard copy faxed to pharmacy.  Pharmacy does close at 6:00, and pt is requesting this asap. Sending to DOD as BQ is in hospital and is unable to sign for this rx.  AD are you ok with resigning order for 15mL of oxycodone concentrate?  Thanks.

## 2015-05-26 NOTE — Discharge Instructions (Signed)
Alert Dr Anastasia Pall office if you develop tremor or twitching now that you are on reglan and haldol

## 2015-05-26 NOTE — Discharge Summary (Signed)
Physician Discharge Summary       Patient ID: Kathryn Ruiz MRN: [id number] DOB/AGE: 30-May-1940 75 y.o.  Admit date: 05/23/2015 Discharge date: 05/26/2015  Discharge Diagnoses:   Severe Dysphagia / Anorexia History of esophageal stricture FTT AKI COPD/Emphysema Pulmonary Fibrosis Chronic Hypoxic Resp Failure  HTN  Chronic pain/opiate dependence  Detailed Hospital Course:  Pt presented to office for follow up for her pulmonary fibroiss and COPD . She complained that she has been very weak over last month . She had not been able to eat or drink much of anything . She could not swallow any pills. Had severe nausea and dry heaves. Complained of decreased urination, constipation with little bowel movement. . She had a history of dysphagia with previous esophageal stricture requiring dilation but GI of 2016 says she cant have another endoscopy due to underlying issues. She was admitted for further evaluation and treatment. She was fould to have AKI and dehydration which was treated w/ IV fluid resuscitation.  GI was consulted. She had a barium swallow that did not show obstruction. It was felt that perhaps her Esbriet was a contributing factor and because of this it was decided to d/c this treatment. Palliative care was consulted. It was decided that she would go home w/ hospice. At time of d/c she feels better and is ready for d/c with the plan as outlined below.    Discharge Plan by active problems    Chronic respiratory failure COPD Pulmonary Fibrosis > poorly understood, possibly aspiration related vs UIP FTT Plan  Esbriet is on hold  Continue on ANORO  Albuterol Neb as needed   Severe Dysphagia / Anorexia > no clear obstruction on esophogram; I worry the Esbriet is a big factor here leading to anorexia History of esophageal stricture Plan  Hold Esbriet indefinitely D3 diet with full liquid only  PPI   HTN  Plan  Hold Lisinopril for now   Chronic pain/opiate  dependence Plan Oxycodone Concentrate 10mg /0.54ml: 5mg  (0.70ml) sublingual every 4 hours scheduled with additional dose every 1 hour as needed for pain or shortness of breath - Lorazepam 2mg /ml concentrated solution: 1mg  (0.52ml) sublingual every 4 hours as needed for anxiety - Haldol 2mg /ml solution: 0.5mg  (0.80ml) sublingual every 4 hours as needed for nausea    Significant Hospital tests/ studies  Consults: GI and palliative   Discharge Exam: BP 100/40 mmHg  Pulse 67  Temp(Src) 98.3 F (36.8 C) (Oral)  Resp 18  Ht 4\' 11"  (1.499 m)  Wt 118 lb (53.524 kg)  BMI 23.82 kg/m2  SpO2 97% 5 liters  General: Frail, sitting up on side of bed HENT: NCAT OP clear PULM: CTA, no wheezing, few crackles bases CV: RRR, no mgr5GI: BS+, soft, nontender Derm: normal bulk and tone Neuro: Awake, alert, following commands  Labs at discharge Lab Results  Component Value Date   CREATININE 1.81* 05/26/2015   BUN 36* 05/26/2015   NA 139 05/26/2015   K 4.9 05/26/2015   CL 110 05/26/2015   CO2 21* 05/26/2015   Lab Results  Component Value Date   WBC 11.2* 05/25/2015   HGB 10.0* 05/25/2015   HCT 31.4* 05/25/2015   MCV 83.7 05/25/2015   PLT 272 05/25/2015   Lab Results  Component Value Date   ALT 11* 05/23/2015   AST 18 05/23/2015   ALKPHOS 91 05/23/2015   BILITOT 0.5 05/23/2015   No results found for: INR, PROTIME  Current radiology studies No results found.  Disposition:  01-Home  or Self Care      Discharge Instructions    Diet - low sodium heart healthy    Complete by:  As directed      Increase activity slowly    Complete by:  As directed             Medication List    STOP taking these medications        furosemide 20 MG tablet  Commonly known as:  LASIX     lisinopril 10 MG tablet  Commonly known as:  PRINIVIL,ZESTRIL     morphine 20 MG/5ML solution     potassium chloride 20 MEQ packet  Commonly known as:  KLOR-CON      TAKE these medications         diphenhydrAMINE 25 MG tablet  Commonly known as:  BENADRYL  Take 50 mg by mouth at bedtime as needed for sleep.     fluticasone 27.5 MCG/SPRAY nasal spray  Commonly known as:  VERAMYST  Place 2 sprays into the nose daily as needed for rhinitis or allergies.     guaiFENesin-codeine 100-10 MG/5ML syrup  Commonly known as:  CHERATUSSIN AC  Take 5 mLs by mouth 3 (three) times daily as needed for cough.     haloperidol 2 MG/ML solution  Commonly known as:  HALDOL  Place 0.3 mLs (0.6 mg total) under the tongue every 6 (six) hours as needed (nausea).     LORazepam 2 MG/ML concentrated solution  Commonly known as:  ATIVAN  Place 0.5 mLs (1 mg total) under the tongue every 4 (four) hours as needed for anxiety.     metoCLOPramide 5 MG/5ML solution  Commonly known as:  REGLAN  Take 5 mLs (5 mg total) by mouth 4 (four) times daily -  before meals and at bedtime.     OxyCODONE HCl 10 MG/0.5ML Conc  Place 5 mg under the tongue every 4 (four) hours as needed (shortness of breath).     pantoprazole 40 MG tablet  Commonly known as:  PROTONIX  Take 1 tablet (40 mg total) by mouth daily.     umeclidinium-vilanterol 62.5-25 MCG/INH Aepb  Commonly known as:  ANORO ELLIPTA  Inhale 1 puff into the lungs daily.       Follow-up Information    Follow up with [facility].   Specialty:  Hospice and Palliative Medicine   Why:  RN will contact you to arrange appointment time   Contact information:   [place] [address] [phone]       Follow up with Rexene Edison, NP On 06/09/2015.   Specialty:  Pulmonary Disease   Why:  1030 am    Contact information:   [address] [address] [phone]       Discharged Condition: good  Physician Statement:   The Patient was personally examined, the discharge assessment and plan has been personally reviewed and I agree with ACNP Vick Filter's assessment and plan. > 30 minutes of time have been  dedicated to discharge assessment, planning and discharge instructions.   Signed: Clementeen Graham 05/26/2015, 12:36 PM

## 2015-05-26 NOTE — Care Management Important Message (Signed)
Important Message  Patient Details  Name: Kathryn Ruiz MRN: [id number] Date of Birth: January 31, 1940   Medicare Important Message Given:  Yes    Camillo Flaming 05/26/2015, 9:30 AMImportant Message  Patient Details  Name: Kathryn Ruiz MRN: [id number] Date of Birth: 06-05-1940   Medicare Important Message Given:  Yes    Camillo Flaming 05/26/2015, 9:30 AM

## 2015-05-26 NOTE — Progress Notes (Signed)
   Staff MD note  S: -Her expectations are that she is going home today. D/w Dr Domingo Cocking - AKI improvement was one of goals prior to dc today  O Looks stable. She is on 5 L oxygen. Bilateral crackles present. No distress.   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 AKI - improving with fluids  Plan -Palliative and hospice support hopefully home daily  - APP wuuill do dc plan today  Dr. Brand Males, M.D., [facility].C.P Pulmonary and Critical Care Medicine Staff Physician [facility] Pager: [phone], If no answer or between  15:00h - 7:00h: call [phone]  05/26/2015 9:23 AM

## 2015-05-26 NOTE — Progress Notes (Signed)
05/25/2015 1830 Called received from [facility] on call RN accepting referral. [facility] RN will follow up with pt on 5/1. Jonnie Finner RN CCM Case Mgmt phone [phone]

## 2015-05-26 NOTE — Telephone Encounter (Signed)
Yes.   AD.

## 2015-05-26 NOTE — Care Management Note (Signed)
Case Management Note  Patient Details  Name: JAYLEIGH ZEIGER MRN: [id number] Date of Birth: [birth year]  Subjective/Objective:  Faxed d/c summary to [phone] w/confirmation,tc rep Kara [phone] confirmed they have all info needed. Home visit scheduled today @ 4p, they have already made arrangements for dme delivery.                  Action/Plan:d/c home w/hospice.   Expected Discharge Date:   (unknown)               Expected Discharge Plan:  Home w Hospice Care  In-House Referral:  NA  Discharge planning Services  CM Consult  Post Acute Care Choice:  Hospice Choice offered to:  Patient, Spouse  DME Arranged:  N/A DME Agency:  NA  HH Arranged:  RN [place] Agency:  [facility]  Status of Service:  Completed, signed off  Medicare Important Message Given:  Yes Date Medicare IM Given:    Medicare IM give by:    Date Additional Medicare IM Given:    Additional Medicare Important Message give by:     If discussed at [place] of Stay Meetings, dates discussed:    Additional Comments:  Dessa Phi, RN 05/26/2015, 2:21 PM

## 2015-05-26 NOTE — Progress Notes (Signed)
Daily Progress Note   Patient Name: Kathryn Ruiz       Date: 05/26/2015 DOB: 01-19-1941  Age: 75 y.o. MRN#: [id number] Attending Physician: Juanito Doom, MD Primary Care Physician: Dicky Doe, MD Admit Date: 05/23/2015  Reason for Consultation/Follow-up: Disposition, Establishing goals of care, Non pain symptom management and Pain control  Subjective: Met this morning with [name], her husband, her daughter, and her son-in-law.  She reports feeling "pretty well, but ready to get out of here."  She is still having poor appetite.  She thinks that the oxycodone has been working well for SOB and that she may have some improvement in her anorexia since starting reglan.    Plan is for d/c with home hospice through [facility].  Referral has been placed and patient accepted per CM, but family still waiting to hear from hospice agency.   Length of Stay: 3  Current Medications: Scheduled Meds:  . arformoterol  15 mcg Nebulization BID   And  . umeclidinium bromide  1 puff Inhalation Daily  . enoxaparin (LOVENOX) injection  30 mg Subcutaneous Q24H  . feeding supplement  1 Container Oral BID BM  . feeding supplement (PRO-STAT SUGAR FREE 64)  30 mL Oral BID  . metoCLOPramide  5 mg Oral TID AC & HS  . oxyCODONE  5 mg Oral Q4H  . pantoprazole (PROTONIX) IV  40 mg Intravenous Q24H  . sodium chloride flush  3 mL Intravenous Q12H    Continuous Infusions: . dextrose 5 % and 0.45% NaCl 1,000 mL (05/26/15 0042)    PRN Meds: sodium chloride, acetaminophen, albuterol, ondansetron **OR** ondansetron (ZOFRAN) IV, sodium chloride flush  Physical Exam      General: Alert, awake, in no acute distress. Frail.  HEENT: No bruits, no goiter, no JVD Heart: Regular rate and rhythm. No  murmur appreciated. Lungs: Fair air movement, clear other than some crackles at bases Abdomen: Soft, nontender, nondistended, positive bowel sounds.  Ext: No significant edema Skin: Warm and dry Neuro: Grossly intact, nonfocal.       Vital Signs: BP 100/40 mmHg  Pulse 67  Temp(Src) 98.3 F (36.8 C) (Oral)  Resp 18  Ht '4\' 11"'  (1.499 m)  Wt 53.524 kg (118 lb)  BMI 23.82 kg/m2  SpO2 97% SpO2: SpO2: 97 % O2 Device: O2  Device: Nasal Cannula O2 Flow Rate: O2 Flow Rate (L/min): 4 L/min  Intake/output summary:   Intake/Output Summary (Last 24 hours) at 05/26/15 1105 Last data filed at 05/26/15 4944  Gross per 24 hour  Intake    720 ml  Output    400 ml  Net    320 ml   LBM: Last BM Date: 05/16/15 (per report/MD aware,pt is not eating ) Baseline Weight: Weight: 53.524 kg (118 lb) Most recent weight: Weight: 53.524 kg (118 lb)       Palliative Assessment/Data:    Flowsheet Rows        Most Recent Value   Intake Tab    Referral Department  Pulmonary   Unit at Time of Referral  Oncology Unit   Palliative Care Primary Diagnosis  Pulmonary   Date Notified  05/23/15   Palliative Care Type  New Palliative care   Reason for referral  Clarify Goals of Care   Date of Admission  05/23/15   # of days IP prior to Palliative referral  0   Clinical Assessment    Psychosocial & Spiritual Assessment    Palliative Care Outcomes       Patient Active Problem List   Diagnosis Date Noted  . IPF (idiopathic pulmonary fibrosis) ([place]) 05/26/2015  . AKI (acute kidney injury) ([place]) 05/26/2015  . Dyspnea   . Interstitial lung disease ([place])   . Palliative care encounter   . Goals of care, counseling/discussion   . Loss of weight   . Dysphagia 05/23/2015  . FTT (failure to thrive) in adult 05/23/2015  . Protein-calorie malnutrition, severe 05/23/2015  . Lack of appetite 01/24/2015  . HBP (high blood pressure) 01/21/2015  . Fibromyalgia 10/18/2014  . Fatigue due to depression  06/26/2014  . Constipation 04/16/2014  . Rash and nonspecific skin eruption 04/16/2014  . Muscular deconditioning 01/30/2014  . COPD exacerbation ([place]) 12/10/2013  . Schatzki's ring 07/17/2013  . Hiatal hernia 07/17/2013  . Esophageal dysphagia 04/12/2013  . Osteopenia 02/05/2013  . Allergic rhinitis 05/30/2012  . Cough 02/07/2012  . Chronic hypoxemic respiratory failure ([place]) 08/06/2011  . COPD (chronic obstructive pulmonary disease) ([place]) 07/09/2011  . Pulmonary fibrosis ([place]) 07/09/2011  . Dyspnea on exertion 06/08/2011    Palliative Care Assessment & Plan   Patient Profile: 75 y.o. female with past medical history of heavy smoking with severe COPD/emphysema, pulmonary fibrosis on home O2, fibromyalgia, GERD, CHF, depression, and esophageal stricture status post dilatation admitted on 05/23/2015 with dehydration, dysphasia, and failure to thrive.  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 Continue oxycodone concentrated solution 5 mg every 4 hours on discharge. There is also some thought that Reglan can be helpful in treatment of symptoms of opioid-induced bowel dysfunction. Recommend continue Reglan 5 mg 4 times daily on discharge. - Constipation: She reports bowel movement after suppository. Will need to continue to develop home bowel regimen when she is discharged with hospice support. She reports that she does not think she'll be able to take anything extra orally today for bowel regimen. - Shortness of breath: Well managed on oxycodone. Recommend continue same on discharge.  If additional scripts requested by hospice for comfort meds, would recommend scripts for: - Oxycodone Concentrate 22m/0.5ml: 547m(0.2549msublingual every 4 hours scheduled with additional dose every 1 hour as needed for pain or shortness of breath - Lorazepam 2mg10m  concentrated solution:  76m (0.523m sublingual every 4 hours as needed for anxiety - Haldol 8m23ml solution: 0.5mg38m.25ml16mblingual every 4 hours as needed for agitation or nausea    Goals of Care and Additional Recommendations:  Discussed at length with patient and her family. They are in agreement that she will be well served by transitioning home with hospice support. I placed a referral to care management to present options.  Code Status:    Code Status Orders        Start     Ordered   05/25/15 0952 [phone] attempt resuscitation (DNR)   Continuous    Question Answer Comment  In the event of cardiac or respiratory ARREST Do not call a "code blue"   In the event of cardiac or respiratory ARREST Do not perform Intubation, CPR, defibrillation or ACLS   In the event of cardiac or respiratory ARREST Use medication by any route, position, wound care, and other measures to relive pain and suffering. May use oxygen, suction and manual treatment of airway obstruction as needed for comfort.      05/25/15 0951 7939ode Status History    Date Active Date Inactive Code Status Order ID Comments User Context   05/23/2015  1:25 PM 05/25/2015  9:51 AM Full Code [id number] NeedlesInpatient    Advance Directive Documentation        Most Recent Value   Type of Advance Directive  Healthcare Power of Attorney, Living will   Pre-existing out of facility DNR order (yellow form or pink MOST form)     "MOST" Form in Place?         Prognosis:   < 6 months. She has chronic progressive lung disease and has also developed anorexia with little nutritional intake over the past several weeks to months. She is developed weight loss as well as dehydration and kidney injury related to this. If her disease continues along its natural course, I would anticipate her prognosis to be less than 6 months and she should qualify for hospice support.  Discharge Planning:  Home with Hospice  Care plan  was discussed with patient, family, and Dr. McQuaLake Bellsnk you for allowing the Palliative Medicine Team to assist in the care of this patient.   Time In: 1050 Time Out: 1110 Total Time 20 Prolonged Time Billed no      Greater than 50%  of this time was spent counseling and coordinating care related to the above assessment and plan.  [place] Micheline Rough Please contact Palliative Medicine Team phone at [phone] and concerns.

## 2015-05-27 ENCOUNTER — Telehealth: Payer: Self-pay | Admitting: Family Medicine

## 2015-05-27 NOTE — Telephone Encounter (Signed)
Ok to order both.  I have signed rest of Hospice orders and they are to be faxed back.-jh

## 2015-05-27 NOTE — Telephone Encounter (Signed)
Ms. Kathryn Ruiz called from  [facility]  [phone]   Need prescription  (order) for  Sorbiaol, dulcolax,  Duo nebs

## 2015-05-27 NOTE — Telephone Encounter (Signed)
Duo neb qid routine and every 4 hrs prn. D/C Haldol and replace with Zofran q4 prn nausea and they can crush and give.

## 2015-05-28 ENCOUNTER — Other Ambulatory Visit: Payer: Self-pay | Admitting: Family Medicine

## 2015-05-28 MED ORDER — ONDANSETRON HCL 4 MG PO TABS: 4.0000 mg | ORAL_TABLET | ORAL | Status: DC | PRN

## 2015-05-28 MED ORDER — IPRATROPIUM-ALBUTEROL 0.5-2.5 (3) MG/3ML IN SOLN: 3.0000 mL | RESPIRATORY_TRACT | Status: AC | PRN

## 2015-05-28 NOTE — Telephone Encounter (Signed)
Orders have been faxed to Hospice. AK signed orders for Zofran and Duoneb.

## 2015-05-29 ENCOUNTER — Ambulatory Visit
Admission: RE | Admit: 2015-05-29 | Discharge: 2015-05-29 | Disposition: A | Discharge status: Home / Self Care | Source: Ambulatory Visit | Attending: Family Medicine | Admitting: Family Medicine

## 2015-05-29 ENCOUNTER — Other Ambulatory Visit: Payer: Self-pay | Admitting: Family Medicine

## 2015-05-29 ENCOUNTER — Ambulatory Visit (INDEPENDENT_AMBULATORY_CARE_PROVIDER_SITE_OTHER): Admitting: Family Medicine

## 2015-05-29 ENCOUNTER — Encounter: Payer: Self-pay | Admitting: Family Medicine

## 2015-05-29 VITALS — BP 133/76 | HR 76 | Temp 97.7°F | Resp 16 | Ht 59.0 in | Wt 119.0 lb

## 2015-05-29 DIAGNOSIS — M25552 Pain in left hip: Secondary | ICD-10-CM

## 2015-05-29 DIAGNOSIS — I739 Peripheral vascular disease, unspecified: Secondary | ICD-10-CM | POA: Insufficient documentation

## 2015-05-29 DIAGNOSIS — M160 Bilateral primary osteoarthritis of hip: Secondary | ICD-10-CM | POA: Diagnosis not present

## 2015-05-29 NOTE — Progress Notes (Signed)
Name: Kathryn Ruiz   MRN: [id number]    DOB: 08-May-1940   Date:05/29/2015       Progress Note  Subjective  Chief Complaint  Chief Complaint  Patient presents with  . Fibromyalgia  . Hip Pain    HPI Here c /o severe L hip pain.  She is terminal with COPD and esophageal stricture.  She can swallow liquids only.  She is under Hospice care.  She is currently taking Oxycodone but she would like to get he back on Morphine for pain control.  She  Feels that she does better on Morphine than on Oxycodine. No problem-specific assessment & plan notes found for this encounter.   Past Medical History  Diagnosis Date  . Fibromyalgia   . GERD (gastroesophageal reflux disease)   . CHF (congestive heart failure) ([place])   . Esophageal stricture   . Depression   . H/O hiatal hernia   . Oxygen dependent     07-05-13 oxygen 24/7 -3 l/m daytime, 4 l/m nighttime nasally.  Marland Kitchen COPD (chronic obstructive pulmonary disease) (H[facility])     Dr. Lake Bells -[facility]    Past Surgical History  Procedure Laterality Date  . Sinus surgery  1999  . Appendectomy  1954  . Cataract extraction  1998    both  . Neuroma surgery  2001  . Foot irrigation  2002  . Disectomy  2003  . Vocal cord biopsy  2005  . Shoulder surgery Left 2010  . Varicose vein surgery Bilateral   . Esophagogastroduodenoscopy (egd) with propofol N/A 07/17/2013    Procedure: ESOPHAGOGASTRODUODENOSCOPY (EGD) WITH PROPOFOL;  Surgeon: Jerene Bears, MD;  Location: [facility];  Service: Gastroenterology;  Laterality: N/A;    Family History  Problem Relation Age of Onset  . Heart attack Father 44  . Heart disease Mother     Social History   Social History  . Marital Status: Married    Spouse Name: N/A  . Number of Children: 5  . Years of Education: N/A   Occupational History  . Retired     Water engineer   Social History Main Topics  . Smoking status: Former Smoker -- 3.00 packs/day for 50 years    Types: Cigarettes    Quit date:  01/26/1999  . Smokeless tobacco: Never Used  . Alcohol Use: No  . Drug Use: No  . Sexual Activity: No   Other Topics Concern  . Not on file   Social History Narrative     Current outpatient prescriptions:  .  diphenhydrAMINE (BENADRYL) 25 MG tablet, Take 50 mg by mouth at bedtime as needed for sleep., Disp: , Rfl:  .  fluticasone (VERAMYST) 27.5 MCG/SPRAY nasal spray, Place 2 sprays into the nose daily as needed for rhinitis or allergies. , Disp: , Rfl:  .  guaiFENesin-codeine (CHERATUSSIN AC) 100-10 MG/5ML syrup, Take 5 mLs by mouth 3 (three) times daily as needed for cough., Disp: 120 mL, Rfl: 0 .  ipratropium-albuterol (DUONEB) 0.5-2.5 (3) MG/3ML SOLN, Take 3 mLs by nebulization every 4 (four) hours as needed., Disp: 360 mL, Rfl: 11 .  LORazepam (ATIVAN) 2 MG/ML concentrated solution, Place 0.5 mLs (1 mg total) under the tongue every 4 (four) hours as needed for anxiety., Disp: 30 mL, Rfl: 0 .  metoCLOPramide (REGLAN) 5 MG/5ML solution, Take 5 mLs (5 mg total) by mouth 4 (four) times daily -  before meals and at bedtime., Disp: 120 mL, Rfl: 6 .  ondansetron (ZOFRAN) 4 MG tablet,  Take 1 tablet (4 mg total) by mouth every 4 (four) hours as needed for nausea or vomiting., Disp: 30 tablet, Rfl: 11 .  OxyCODONE HCl 10 MG/0.5ML CONC, Place 5 mg under the tongue every 4 (four) hours as needed (shortness of breath)., Disp: 15 mL, Rfl: 0 .  pantoprazole (PROTONIX) 40 MG tablet, Take 1 tablet (40 mg total) by mouth daily., Disp: 90 tablet, Rfl: 3 .  Umeclidinium-Vilanterol (ANORO ELLIPTA) 62.5-25 MCG/INH AEPB, Inhale 1 puff into the lungs daily. (Patient taking differently: Inhale 1 puff into the lungs daily as needed (shortness of breathe.). ), Disp: 60 each, Rfl: 5  Not on File   Review of Systems  Constitutional: Positive for weight loss and malaise/fatigue. Negative for fever and chills.  HENT: Negative for hearing loss.   Eyes: Negative for blurred vision and double vision.   Respiratory: Positive for cough, shortness of breath and wheezing.   Cardiovascular: Negative for chest pain, palpitations and leg swelling.  Gastrointestinal: Negative for heartburn, abdominal pain and blood in stool.  Genitourinary: Negative for dysuria, urgency and frequency.  Musculoskeletal: Positive for joint pain (L hip pain).  Skin: Negative for rash.  Neurological: Positive for weakness. Negative for headaches.      Objective  Filed Vitals:   05/29/15 1042  BP: 133/76  Pulse: 76  Temp: 97.7 F (36.5 C)  TempSrc: Oral  Resp: 16  Height: 4' 11" (1.499 m)  Weight: 119 lb (53.978 kg)  SpO2: 95%    Physical Exam  Constitutional: She is oriented to person, place, and time and well-developed, well-nourished, and in no distress. No distress.  HENT:  Head: Normocephalic and atraumatic.  Cardiovascular: Normal rate, regular rhythm and normal heart sounds.   Pulmonary/Chest: Effort normal and breath sounds normal.  Wears O2 via nasal canula constantly  Musculoskeletal:  Pain with ROM of L hip and L lower back.  Neurological: She is alert and oriented to person, place, and time.  Vitals reviewed.      Recent Results (from the past 2160 hour(s))  Comprehensive metabolic panel     Status: Abnormal   Collection Time: 05/23/15  1:54 PM  Result Value Ref Range   Sodium 137 135 - 145 mmol/L   Potassium 5.4 (H) 3.5 - 5.1 mmol/L   Chloride 105 101 - 111 mmol/L   CO2 19 (L) 22 - 32 mmol/L   Glucose, Bld 95 65 - 99 mg/dL   BUN 63 (H) 6 - 20 mg/dL   Creatinine, Ser 2.07 (H) 0.44 - 1.00 mg/dL   Calcium 9.3 8.9 - 10.3 mg/dL   Total Protein 7.8 6.5 - 8.1 g/dL   Albumin 3.9 3.5 - 5.0 g/dL   AST 18 15 - 41 U/L   ALT 11 (L) 14 - 54 U/L   Alkaline Phosphatase 91 38 - 126 U/L   Total Bilirubin 0.5 0.3 - 1.2 mg/dL   GFR calc non Af Amer 22 (L) >60 mL/min   GFR calc Af Amer 26 (L) >60 mL/min    Comment: (NOTE) The eGFR has been calculated using the CKD EPI equation. This  calculation has not been validated in all clinical situations. eGFR's persistently <60 mL/min signify possible Chronic Kidney Disease.    Anion gap 13 5 - 15  CBC WITH DIFFERENTIAL     Status: Abnormal   Collection Time: 05/23/15  1:54 PM  Result Value Ref Range   WBC 11.3 (H) 4.0 - 10.5 K/uL   RBC 4.40 3.87 - 5.11  MIL/uL   Hemoglobin 11.9 (L) 12.0 - 15.0 g/dL   HCT 37.2 36.0 - 46.0 %   MCV 84.5 78.0 - 100.0 fL   MCH 27.0 26.0 - 34.0 pg   MCHC 32.0 30.0 - 36.0 g/dL   RDW 14.2 11.5 - 15.5 %   Platelets 309 150 - 400 K/uL   Neutrophils Relative % 72 %   Neutro Abs 8.1 (H) 1.7 - 7.7 K/uL   Lymphocytes Relative 17 %   Lymphs Abs 1.9 0.7 - 4.0 K/uL   Monocytes Relative 10 %   Monocytes Absolute 1.2 (H) 0.1 - 1.0 K/uL   Eosinophils Relative 1 %   Eosinophils Absolute 0.2 0.0 - 0.7 K/uL   Basophils Relative 0 %   Basophils Absolute 0.0 0.0 - 0.1 K/uL  TSH     Status: None   Collection Time: 05/23/15  1:54 PM  Result Value Ref Range   TSH 1.506 0.350 - 4.500 uIU/mL  Urinalysis, Routine w reflex microscopic (not at [facility])     Status: None   Collection Time: 05/23/15  4:14 PM  Result Value Ref Range   Color, Urine YELLOW YELLOW   APPearance CLEAR CLEAR   Specific Gravity, Urine 1.011 1.005 - 1.030   pH 5.0 5.0 - 8.0   Glucose, UA NEGATIVE NEGATIVE mg/dL   Hgb urine dipstick NEGATIVE NEGATIVE   Bilirubin Urine NEGATIVE NEGATIVE   Ketones, ur NEGATIVE NEGATIVE mg/dL   Protein, ur NEGATIVE NEGATIVE mg/dL   Nitrite NEGATIVE NEGATIVE   Leukocytes, UA NEGATIVE NEGATIVE    Comment: MICROSCOPIC NOT DONE ON URINES WITH NEGATIVE PROTEIN, BLOOD, LEUKOCYTES, NITRITE, OR GLUCOSE <1000 mg/dL.  Basic metabolic panel     Status: Abnormal   Collection Time: 05/24/15  3:36 AM  Result Value Ref Range   Sodium 138 135 - 145 mmol/L   Potassium 5.3 (H) 3.5 - 5.1 mmol/L   Chloride 108 101 - 111 mmol/L   CO2 21 (L) 22 - 32 mmol/L   Glucose, Bld 119 (H) 65 - 99 mg/dL   BUN 54 (H) 6 - 20 mg/dL    Creatinine, Ser 1.91 (H) 0.44 - 1.00 mg/dL   Calcium 9.0 8.9 - 10.3 mg/dL   GFR calc non Af Amer 25 (L) >60 mL/min   GFR calc Af Amer 29 (L) >60 mL/min    Comment: (NOTE) The eGFR has been calculated using the CKD EPI equation. This calculation has not been validated in all clinical situations. eGFR's persistently <60 mL/min signify possible Chronic Kidney Disease.    Anion gap 9 5 - 15  CBC     Status: Abnormal   Collection Time: 05/24/15  3:36 AM  Result Value Ref Range   WBC 11.6 (H) 4.0 - 10.5 K/uL   RBC 3.96 3.87 - 5.11 MIL/uL   Hemoglobin 10.9 (L) 12.0 - 15.0 g/dL   HCT 33.5 (L) 36.0 - 46.0 %   MCV 84.6 78.0 - 100.0 fL   MCH 27.5 26.0 - 34.0 pg   MCHC 32.5 30.0 - 36.0 g/dL   RDW 14.2 11.5 - 15.5 %   Platelets 294 150 - 400 K/uL  Glucose, capillary     Status: Abnormal   Collection Time: 05/24/15  8:35 AM  Result Value Ref Range   Glucose-Capillary 132 (H) 65 - 99 mg/dL  Basic metabolic panel     Status: Abnormal   Collection Time: 05/25/15  3:55 AM  Result Value Ref Range   Sodium 140 135 - 145  mmol/L   Potassium 4.9 3.5 - 5.1 mmol/L   Chloride 109 101 - 111 mmol/L   CO2 22 22 - 32 mmol/L   Glucose, Bld 127 (H) 65 - 99 mg/dL   BUN 50 (H) 6 - 20 mg/dL   Creatinine, Ser 2.15 (H) 0.44 - 1.00 mg/dL   Calcium 8.9 8.9 - 10.3 mg/dL   GFR calc non Af Amer 21 (L) >60 mL/min   GFR calc Af Amer 25 (L) >60 mL/min    Comment: (NOTE) The eGFR has been calculated using the CKD EPI equation. This calculation has not been validated in all clinical situations. eGFR's persistently <60 mL/min signify possible Chronic Kidney Disease.    Anion gap 9 5 - 15  CBC with Differential/Platelet     Status: Abnormal   Collection Time: 05/25/15  3:55 AM  Result Value Ref Range   WBC 11.2 (H) 4.0 - 10.5 K/uL   RBC 3.75 (L) 3.87 - 5.11 MIL/uL   Hemoglobin 10.0 (L) 12.0 - 15.0 g/dL   HCT 31.4 (L) 36.0 - 46.0 %   MCV 83.7 78.0 - 100.0 fL   MCH 26.7 26.0 - 34.0 pg   MCHC 31.8 30.0 - 36.0  g/dL   RDW 14.0 11.5 - 15.5 %   Platelets 272 150 - 400 K/uL   Neutrophils Relative % 69 %   Neutro Abs 7.6 1.7 - 7.7 K/uL   Lymphocytes Relative 15 %   Lymphs Abs 1.7 0.7 - 4.0 K/uL   Monocytes Relative 12 %   Monocytes Absolute 1.3 (H) 0.1 - 1.0 K/uL   Eosinophils Relative 4 %   Eosinophils Absolute 0.5 0.0 - 0.7 K/uL   Basophils Relative 0 %   Basophils Absolute 0.0 0.0 - 0.1 K/uL  Glucose, capillary     Status: None   Collection Time: 05/25/15  7:25 AM  Result Value Ref Range   Glucose-Capillary 99 65 - 99 mg/dL   Comment 1 Notify RN    Comment 2 Document in Chart   Basic metabolic panel     Status: Abnormal   Collection Time: 05/26/15  3:36 AM  Result Value Ref Range   Sodium 139 135 - 145 mmol/L   Potassium 4.9 3.5 - 5.1 mmol/L   Chloride 110 101 - 111 mmol/L   CO2 21 (L) 22 - 32 mmol/L   Glucose, Bld 113 (H) 65 - 99 mg/dL   BUN 36 (H) 6 - 20 mg/dL   Creatinine, Ser 1.81 (H) 0.44 - 1.00 mg/dL   Calcium 8.5 (L) 8.9 - 10.3 mg/dL   GFR calc non Af Amer 26 (L) >60 mL/min   GFR calc Af Amer 31 (L) >60 mL/min    Comment: (NOTE) The eGFR has been calculated using the CKD EPI equation. This calculation has not been validated in all clinical situations. eGFR's persistently <60 mL/min signify possible Chronic Kidney Disease.    Anion gap 8 5 - 15  Glucose, capillary     Status: None   Collection Time: 05/26/15  7:41 AM  Result Value Ref Range   Glucose-Capillary 88 65 - 99 mg/dL     Assessment & Plan  Problem List Items Addressed This Visit    None    Visit Diagnoses    Hip pain, left    -  Primary    Relevant Orders    DG HIP UNILAT WITH PELVIS 2-3 VIEWS LEFT       No orders of the defined types were  placed in this encounter.   1. Hip pain, left - DG HIP UNILAT WITH PELVIS 2-3 VIEWS LEFT; Future Increase Oxycodone to .5 cc q 4 hours. discusss m orphined protocal with hospice.

## 2015-05-29 NOTE — Patient Instructions (Signed)
I will contact Hospice to convert back to Morphine protoacotl.  In the mean t ime she may increase Oxycodone to .5 cc every 4 hours as needed.

## 2015-06-06 ENCOUNTER — Encounter: Payer: Self-pay | Admitting: Family Medicine

## 2015-06-06 DIAGNOSIS — R1319 Other dysphagia: Secondary | ICD-10-CM

## 2015-06-06 DIAGNOSIS — J431 Panlobular emphysema: Secondary | ICD-10-CM

## 2015-06-06 DIAGNOSIS — R1310 Dysphagia, unspecified: Secondary | ICD-10-CM

## 2015-06-06 DIAGNOSIS — J84112 Idiopathic pulmonary fibrosis: Secondary | ICD-10-CM

## 2015-06-06 NOTE — Progress Notes (Signed)
FMLA paperwork for daughter certified.

## 2015-06-09 ENCOUNTER — Encounter: Payer: Self-pay | Admitting: Adult Health

## 2015-06-09 ENCOUNTER — Ambulatory Visit (INDEPENDENT_AMBULATORY_CARE_PROVIDER_SITE_OTHER): Admitting: Adult Health

## 2015-06-09 VITALS — BP 142/76 | HR 77 | Temp 97.8°F | Ht <= 58 in | Wt 118.0 lb

## 2015-06-09 DIAGNOSIS — R1314 Dysphagia, pharyngoesophageal phase: Secondary | ICD-10-CM | POA: Diagnosis not present

## 2015-06-09 DIAGNOSIS — J9611 Chronic respiratory failure with hypoxia: Secondary | ICD-10-CM

## 2015-06-09 DIAGNOSIS — R1310 Dysphagia, unspecified: Secondary | ICD-10-CM

## 2015-06-09 DIAGNOSIS — J449 Chronic obstructive pulmonary disease, unspecified: Secondary | ICD-10-CM | POA: Diagnosis not present

## 2015-06-09 DIAGNOSIS — R1319 Other dysphagia: Secondary | ICD-10-CM

## 2015-06-09 DIAGNOSIS — J8410 Pulmonary fibrosis, unspecified: Secondary | ICD-10-CM

## 2015-06-09 NOTE — Progress Notes (Signed)
Subjective:    Patient ID: Kathryn Ruiz, female    DOB: 25-Jan-1941, 75 y.o.   MRN: [id number]  HPI 75 year old female former smoker with COPD and pulmonary fibrosis  TEST /Events  150 pack year smoking history, quit 2001 05/2011 PFT [facility] >> Ratio 61%, FEV1 1.43 L 91% pred TLC 4.05 L 106% pred DLCO 41% pred 06/2011 Overnight oximetry >> 96% of the time her O2 saturation was between 80 and 90% (94% under 88%) 06/2011 CT Chest [facility]>> marked emphysema and basilar fibrosis  08/2011 started on 3L O2 with exertion 08/2011 6MW >> 750 ft., O2 saturation 79% on exercise;  09/2011 [facility] 3 11/2011 Pulm rehab [facility] 01/2012 6MW 1010 ft, O2 sat 90% nadir 3L 05/2012 IgE > normal 05/2012 sputum AFB >> 05/2012 sputum fungal >> c. Albicans 07/2012 CT chest >> bilateral severe emphysema; question of honeycombing and interstitial thickening in the bases, not clearly UIP: [place] read> no significant progression since the 06/2011 study 12/2012 PFT> ratio 65%, FEV1 1.29 L (85% pred), no change with BD, TLC 3.52L (93% pred), DLCO 6.2 (36% pred) 12/2012 ILD serology panel negative\ February 2015 barium swallow> mild narrowing of the lower cervical esophagus which results in transient obstruction of the 12 mm barium pill, high grade narrowing of the distal esophagus just proximal to a moderate sized partially reducible hiatal hernia. Maximal measured luminal diameter is 4 mm. 12 mm barium pill would not pass. 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 02/2013 Echo> Normal LVEF, mild LVH and diastolic dysfunction; normal RV and RVSP 03/2013 rejected for consideration for lung transplant [facility] (dysphagia) January 2016 6 minute walk> 299 feet, could only walk 3 minutes, O2 saturation try to 84% on 3 L nasal cannula December 2015 full pulmonary function test> ratio 64%, FEV1 1.22 L (82% predicted), total lung capacity 3.10 L (82% predicted), DLCO 4.7 (28% predicted). October 2016 full pulmonary function testing ratio 68%, FEV1  1.46 L (86% predicted), FVC 2.14 L (94% predicted), DLCO 7.85 (44% predicted).  06/09/2015 [facility] follow up  Patient returns for a post hospital follow-up Kathryn Ruiz was recently admitted for severe dysphagia and failure to thrive. Her Esbriet was held due to suspicion for contribution to anorexia . Barium swallow did not show a stricture or obstruction.  Kathryn Ruiz was seen by palliative care and is being set up for home hospice. A DO NOT RESUSCITATE status was established. She was started on Reglan. She is very pleased with Hospice. Really feels the extra assistance is of great benefit.  Has nurse, social worker, chaplain, cna.  Husband feels better, a lot of help for him as well.    Since discharge, she is feeling better. Eating and drinking better.  Started to go to bathroom better. Feels she is getting back to normal .  Was started on sorbitol with hospice but was not able to tolerate.   Off Lisinopril and lasix . B/p is doing okay. Has not noticed much difference off this.   Denies chest pain, orthopnea, edema or fever.    Past Medical History  Diagnosis Date  . Fibromyalgia   . GERD (gastroesophageal reflux disease)   . CHF (congestive heart failure) ([place])   . Esophageal stricture   . Depression   . H/O hiatal hernia   . Oxygen dependent     07-05-13 oxygen 24/7 -3 l/m daytime, 4 l/m nighttime nasally.  Marland Kitchen COPD (chronic obstructive pulmonary disease) ([facility])     Dr. Lake Bells [facility]   Current Outpatient  Prescriptions on File Prior to Visit  Medication Sig Dispense Refill  . diphenhydrAMINE (BENADRYL) 25 MG tablet Take 50 mg by mouth at bedtime as needed for sleep.    . fluticasone (VERAMYST) 27.5 MCG/SPRAY nasal spray Place 2 sprays into the nose daily as needed for rhinitis or allergies.     Marland Kitchen guaiFENesin-codeine (CHERATUSSIN AC) 100-10 MG/5ML syrup Take 5 mLs by mouth 3 (three) times daily as needed for cough. 120 mL 0  . ipratropium-albuterol (DUONEB) 0.5-2.5 (3)  MG/3ML SOLN Take 3 mLs by nebulization every 4 (four) hours as needed. 360 mL 11  . LORazepam (ATIVAN) 2 MG/ML concentrated solution Place 0.5 mLs (1 mg total) under the tongue every 4 (four) hours as needed for anxiety. 30 mL 0  . metoCLOPramide (REGLAN) 5 MG/5ML solution Take 5 mLs (5 mg total) by mouth 4 (four) times daily -  before meals and at bedtime. 120 mL 6  . morphine (ROXANOL) 20 MG/ML concentrated solution Take 20 mg by mouth every 2 (two) hours as needed for severe pain (0.25  q hr prn pain or sob).    . ondansetron (ZOFRAN) 4 MG tablet Take 1 tablet (4 mg total) by mouth every 4 (four) hours as needed for nausea or vomiting. 30 tablet 11  . Umeclidinium-Vilanterol (ANORO ELLIPTA) 62.5-25 MCG/INH AEPB Inhale 1 puff into the lungs daily. (Patient not taking: Reported on 06/09/2015) 60 each 5   No current facility-administered medications on file prior to visit.     Review of Systems Constitutional:   No  weight loss, night sweats,  Fevers, chills,  +fatigue, or  lassitude.  HEENT:   No headaches,  Difficulty swallowing,  Tooth/dental problems, or  Sore throat,                No sneezing, itching, ear ache, nasal congestion, post nasal drip,   CV:  No chest pain,  Orthopnea, PND, swelling in lower extremities, anasarca, dizziness, palpitations, syncope.   GI  No heartburn, indigestion, abdominal pain, nausea, vomiting, diarrhea, change in bowel habits, loss of appetite, bloody stools.   Resp:  .  No chest wall deformity  Skin: no rash or lesions.  GU: no dysuria, change in color of urine, no urgency or frequency.  No flank pain, no hematuria   MS:  No joint pain or swelling.  No decreased range of motion.  No back pain.  Psych:  No change in mood or affect. No depression or anxiety.  No memory loss.         Objective:   Physical Exam Filed Vitals:   06/09/15 1049  BP: 142/76  Pulse: 77  Temp: 97.8 F (36.6 C)  TempSrc: Oral  Height: 4\' 10"  (1.473 m)  Weight:  118 lb (53.524 kg)  SpO2: 97%     GEN: A/Ox3; pleasant , NAD, frail and elderly on O2.   HEENT:  [place]/AT,  EACs-clear, TMs-wnl, NOSE-clear, THROAT-clear, no lesions, no postnasal drip or exudate noted.   NECK:  Supple w/ fair ROM; no JVD; normal carotid impulses w/o bruits; no thyromegaly or nodules palpated; no lymphadenopathy.  RESP  Faint BB crackles no accessory muscle use, no dullness to percussion  CARD:  RRR, no m/r/g  , no peripheral edema, pulses intact, no cyanosis or clubbing.  GI:   Soft & nt; nml bowel sounds; no organomegaly or masses detected.  Musco: Warm bil, no deformities or joint swelling noted.   Neuro: alert, no focal deficits noted.  Skin: Warm, no lesions or rashes  Assessment & Plan:   Allayna Erlich NP-C  [facility]  06/09/2015

## 2015-06-09 NOTE — Assessment & Plan Note (Signed)
Cont on current regimen  FMLA paperwork for daughter completed.  DNR is in place.

## 2015-06-09 NOTE — Assessment & Plan Note (Signed)
Compensated on o2  Remain off Esbriet  Cont w/ hospice.

## 2015-06-09 NOTE — Assessment & Plan Note (Signed)
Cont on o2 .  

## 2015-06-09 NOTE — Assessment & Plan Note (Signed)
Improved off Esbriet., also suspect reglan is helping  Cont current reigmen.

## 2015-06-09 NOTE — Patient Instructions (Signed)
Continue on current regimen  Continue with Hospice .  Follow up Dr. Lake Bells in 4 months and As needed

## 2015-06-10 NOTE — Progress Notes (Signed)
Reviewed, agree 

## 2015-06-17 ENCOUNTER — Telehealth: Payer: Self-pay | Admitting: Family Medicine

## 2015-06-17 NOTE — Telephone Encounter (Signed)
Done.-jh 

## 2015-06-17 NOTE — Telephone Encounter (Signed)
Rx done.  Call Teressa to let her know this has been done.-jh

## 2015-06-17 NOTE — Telephone Encounter (Signed)
Teressa informed rx sent to pharmacy.[place]

## 2015-06-17 NOTE — Telephone Encounter (Signed)
Called Helene Kelp back and informed that rx has been faxed to pharmacy. She is requesting another rx for morphine sulfate 20 mg/mL 0.25 to 1 mL q hour prn pain and sob #60. Patient has 1/2 bottle left and will run out by weekend.

## 2015-06-17 NOTE — Telephone Encounter (Signed)
Kathryn Ruiz with Hospice said pt needs an order for duragesic 25 microgram patches apply 1 patch every 72 hours quantity 10 patches sent to [facility].  It needs to be written on prescription pad and a legible copy needs to be faxed to [phone].  Please write Hospice Patient on prescription.  Call Kathryn Ruiz at [phone] when this is done

## 2015-07-08 ENCOUNTER — Telehealth: Payer: Self-pay | Admitting: Family Medicine

## 2015-07-08 NOTE — Telephone Encounter (Signed)
Lendon Colonel with Hospice needs prescriptions for pt's duragesic patches 25 microgram, apply one patch every 72 hours, quantity 10 patches and write Hospice Patient on script.  Also morphine sulfate solution 20 mg to ml, dose 0.25 per ml, every hour as needed for pain or shortness of breath, quantity 60 cc and write Hospice Patient on script.  Please fax to [facility].  Call [place] when complete [phone]

## 2015-07-15 ENCOUNTER — Other Ambulatory Visit: Payer: Self-pay | Admitting: *Deleted

## 2015-07-15 MED ORDER — AMITRIPTYLINE HCL 25 MG PO TABS: 25.0000 mg | ORAL_TABLET | Freq: Every day | ORAL | Status: DC

## 2015-07-18 ENCOUNTER — Telehealth: Payer: Self-pay | Admitting: *Deleted

## 2015-07-18 ENCOUNTER — Other Ambulatory Visit: Payer: Self-pay | Admitting: *Deleted

## 2015-07-18 MED ORDER — MORPHINE SULFATE (CONCENTRATE) 20 MG/ML PO SOLN: 20.0000 mg | ORAL | Status: DC | PRN

## 2015-07-18 MED ORDER — FENTANYL 25 MCG/HR TD PT72: 25.0000 ug | MEDICATED_PATCH | TRANSDERMAL | Status: DC

## 2015-07-18 NOTE — Telephone Encounter (Signed)
Patient's daughter called and stated Kathryn Ruiz is having difficulty with new medication Amitriptyline that was added. She feel out of bed the other night. Advised patient that Dr. Luan Pulling out of office today and will return Monday. She should consult with the hospice physician. She verbalizes understanding.

## 2015-07-21 ENCOUNTER — Other Ambulatory Visit: Payer: Self-pay | Admitting: *Deleted

## 2015-07-21 ENCOUNTER — Other Ambulatory Visit: Payer: Self-pay | Admitting: Family Medicine

## 2015-07-21 MED ORDER — FENTANYL 12 MCG/HR TD PT72: 12.5000 ug | MEDICATED_PATCH | TRANSDERMAL | Status: DC

## 2015-07-21 NOTE — Telephone Encounter (Signed)
Spoke to patient and she says she stopped Amitriptyline. She can't tolerate and made this decision. She would like dose on pain patch increased if possible.

## 2015-07-21 NOTE — Telephone Encounter (Signed)
Discontinue Elavil .  I will send Fentanyl patch 12 mg to add to 25 mg to be changed every 72 hrs.-jh

## 2015-07-21 NOTE — Telephone Encounter (Signed)
Call patients 'family to check on [patient this AM.  See what was changed by Hospice MD.-jh

## 2015-07-21 NOTE — Telephone Encounter (Signed)
Lendon Colonel from  Hospice called [phone] states that  Pt. took Elavil at bed time and she was so out of it have requested to be taken off this medicine. Rosanne Ashing have requested that you add additional Fentanyl 12 mg patch. (so patch will total Fentanyl  37 MCG)

## 2015-07-21 NOTE — Telephone Encounter (Signed)
rx printed and will be faxed to pharmacy.[place]

## 2015-07-21 NOTE — Telephone Encounter (Deleted)
Kathryn Ruiz

## 2015-07-22 ENCOUNTER — Telehealth: Payer: Self-pay | Admitting: Family Medicine

## 2015-07-22 NOTE — Telephone Encounter (Signed)
Called Teressa and made her aware rx for 12.5 mg Fentanyl patch has been added.

## 2015-07-22 NOTE — Telephone Encounter (Signed)
Kathryn Ruiz at [facility] is waiting on a phone call or fax regarding pt's pain medication.  Her call back number is [phone] and fax [phone]

## 2015-07-25 ENCOUNTER — Telehealth: Payer: Self-pay | Admitting: Pulmonary Disease

## 2015-07-25 NOTE — Telephone Encounter (Signed)
LVM for Barbara to return call

## 2015-07-28 NOTE — Telephone Encounter (Signed)
Left message for Kathryn Ruiz to call back.

## 2015-07-30 NOTE — Telephone Encounter (Signed)
Spoke with Pamala Hurry at [facility]. I asked her to have this physicians order refaxed to [place]. She is going to fax this to Ashley's attention.

## 2015-07-31 NOTE — Telephone Encounter (Signed)
Order was received and signed by BQ yesterday. Form has been faxed back to Hospice. Nothing further was needed.

## 2015-08-11 ENCOUNTER — Telehealth: Payer: Self-pay | Admitting: Family Medicine

## 2015-08-11 ENCOUNTER — Other Ambulatory Visit: Payer: Self-pay | Admitting: *Deleted

## 2015-08-11 MED ORDER — MORPHINE SULFATE (CONCENTRATE) 20 MG/ML PO SOLN: 20.0000 mg | ORAL | Status: DC | PRN

## 2015-08-11 MED ORDER — FENTANYL 25 MCG/HR TD PT72: 25.0000 ug | MEDICATED_PATCH | TRANSDERMAL | Status: DC

## 2015-08-11 NOTE — Telephone Encounter (Signed)
Rx faxed and  Teressa called. She will notify family.[place]

## 2015-08-11 NOTE — Telephone Encounter (Signed)
Kathryn Ruiz with Hospice said pt needs a prescription for the morphine solution 20 mg per ml with a dose of 0.25 to 1 ml every hour as needed for pain and shortness of breath.  Please write Hospice Patient on prescription.  She also needs duragesic 25 mcg apply one patch every 72 hours with quantity of 10 patches and write Hospice Patient on prescription.  Fax prescriptions to [place].  Please sent ASAP pt is almost out of morphine.  Her call back number is [phone]

## 2015-08-11 NOTE — Telephone Encounter (Signed)
Done.-jh 

## 2015-08-26 ENCOUNTER — Other Ambulatory Visit: Payer: Self-pay | Admitting: Family Medicine

## 2015-08-26 DIAGNOSIS — R1310 Dysphagia, unspecified: Secondary | ICD-10-CM | POA: Diagnosis not present

## 2015-08-26 DIAGNOSIS — R627 Adult failure to thrive: Secondary | ICD-10-CM | POA: Diagnosis not present

## 2015-08-26 DIAGNOSIS — F329 Major depressive disorder, single episode, unspecified: Secondary | ICD-10-CM | POA: Diagnosis not present

## 2015-08-26 DIAGNOSIS — M8580 Other specified disorders of bone density and structure, unspecified site: Secondary | ICD-10-CM | POA: Diagnosis not present

## 2015-08-26 DIAGNOSIS — Z9981 Dependence on supplemental oxygen: Secondary | ICD-10-CM | POA: Diagnosis not present

## 2015-08-26 DIAGNOSIS — J8410 Pulmonary fibrosis, unspecified: Secondary | ICD-10-CM | POA: Diagnosis not present

## 2015-08-26 DIAGNOSIS — J441 Chronic obstructive pulmonary disease with (acute) exacerbation: Secondary | ICD-10-CM | POA: Diagnosis not present

## 2015-08-26 DIAGNOSIS — I509 Heart failure, unspecified: Secondary | ICD-10-CM | POA: Diagnosis not present

## 2015-08-26 DIAGNOSIS — E43 Unspecified severe protein-calorie malnutrition: Secondary | ICD-10-CM | POA: Diagnosis not present

## 2015-08-26 DIAGNOSIS — K219 Gastro-esophageal reflux disease without esophagitis: Secondary | ICD-10-CM | POA: Diagnosis not present

## 2015-08-26 DIAGNOSIS — J9610 Chronic respiratory failure, unspecified whether with hypoxia or hypercapnia: Secondary | ICD-10-CM | POA: Diagnosis not present

## 2015-08-26 DIAGNOSIS — R634 Abnormal weight loss: Secondary | ICD-10-CM | POA: Diagnosis not present

## 2015-08-26 MED ORDER — MORPHINE SULFATE (CONCENTRATE) 20 MG/ML PO SOLN: 20.0000 mg | ORAL | 0 refills | Status: DC | PRN

## 2015-08-26 MED ORDER — FENTANYL 50 MCG/HR TD PT72: 50.0000 ug | MEDICATED_PATCH | TRANSDERMAL | 0 refills | Status: DC

## 2015-09-04 ENCOUNTER — Ambulatory Visit (INDEPENDENT_AMBULATORY_CARE_PROVIDER_SITE_OTHER): Payer: Medicare Other | Admitting: Family Medicine

## 2015-09-04 ENCOUNTER — Ambulatory Visit
Admission: RE | Admit: 2015-09-04 | Discharge: 2015-09-04 | Disposition: A | Discharge status: Home / Self Care | Payer: Medicare Other | Source: Ambulatory Visit | Attending: Family Medicine | Admitting: Family Medicine

## 2015-09-04 ENCOUNTER — Encounter: Payer: Self-pay | Admitting: Family Medicine

## 2015-09-04 VITALS — BP 145/67 | HR 80 | Temp 97.7°F | Resp 14 | Ht <= 58 in | Wt 117.8 lb

## 2015-09-04 DIAGNOSIS — R350 Frequency of micturition: Secondary | ICD-10-CM

## 2015-09-04 DIAGNOSIS — M7732 Calcaneal spur, left foot: Secondary | ICD-10-CM | POA: Diagnosis not present

## 2015-09-04 DIAGNOSIS — M25572 Pain in left ankle and joints of left foot: Secondary | ICD-10-CM

## 2015-09-04 LAB — POCT URINALYSIS DIPSTICK
Bilirubin, UA: NEGATIVE
GLUCOSE UA: NEGATIVE
Ketones, UA: NEGATIVE
Leukocytes, UA: NEGATIVE
NITRITE UA: NEGATIVE
PH UA: 6
Protein, UA: NEGATIVE
RBC UA: NEGATIVE
SPEC GRAV UA: 1.01
UROBILINOGEN UA: 0.2

## 2015-09-04 NOTE — Progress Notes (Signed)
Name: Kathryn Ruiz   MRN: [id number]    DOB: 07-May-1940   Date:09/04/2015       Progress Note  Subjective  Chief Complaint  Chief Complaint  Patient presents with  . Foot Swelling    left    HPI C/o L ankle tender, red, swollen.  Going on for 9 months or so.  Swelling, and tenderness and pain resolved by morning.  Sx get worse later in day and in evening.  Lateral ankle affected only.  She has to limp on L foot when bothering her.  No problem-specific Assessment & Plan notes found for this encounter.   Past Medical History:  Diagnosis Date  . CHF (congestive heart failure) ([place])   . COPD (chronic obstructive pulmonary disease) (HCC)    Dr. Lake Bells -[facility]  . Depression   . Esophageal stricture   . Fibromyalgia   . GERD (gastroesophageal reflux disease)   . H/O hiatal hernia   . Oxygen dependent    07-05-13 oxygen 24/7 -3 l/m daytime, 4 l/m nighttime nasally.    Social History  Substance Use Topics  . Smoking status: Former Smoker    Packs/day: 3.00    Years: 50.00    Types: Cigarettes    Quit date: 01/26/1999  . Smokeless tobacco: Never Used  . Alcohol use No     Current Outpatient Prescriptions:  .  amitriptyline (ELAVIL) 25 MG tablet, Take 1 tablet (25 mg total) by mouth at bedtime., Disp: 30 tablet, Rfl: 1 .  diphenhydrAMINE (BENADRYL) 25 MG tablet, Take 50 mg by mouth at bedtime as needed for sleep., Disp: , Rfl:  .  fentaNYL (DURAGESIC) 50 MCG/HR, Place 1 patch (50 mcg total) onto the skin every 3 (three) days., Disp: 60 patch, Rfl: 0 .  fluticasone (VERAMYST) 27.5 MCG/SPRAY nasal spray, Place 2 sprays into the nose daily as needed for rhinitis or allergies. , Disp: , Rfl:  .  guaiFENesin-codeine (CHERATUSSIN AC) 100-10 MG/5ML syrup, Take 5 mLs by mouth 3 (three) times daily as needed for cough., Disp: 120 mL, Rfl: 0 .  ipratropium-albuterol (DUONEB) 0.5-2.5 (3) MG/3ML SOLN, Take 3 mLs by nebulization every 4 (four) hours as needed., Disp: 360 mL,  Rfl: 11 .  LORazepam (ATIVAN) 2 MG/ML concentrated solution, Place 0.5 mLs (1 mg total) under the tongue every 4 (four) hours as needed for anxiety., Disp: 30 mL, Rfl: 0 .  metoCLOPramide (REGLAN) 5 MG/5ML solution, Take 5 mLs (5 mg total) by mouth 4 (four) times daily -  before meals and at bedtime., Disp: 120 mL, Rfl: 6 .  morphine (ROXANOL) 20 MG/ML concentrated solution, Take 1 mL (20 mg total) by mouth every hour as needed for severe pain (0.25q hr prn pain or sob)., Disp: 60 mL, Rfl: 0 .  ondansetron (ZOFRAN) 4 MG tablet, Take 1 tablet (4 mg total) by mouth every 4 (four) hours as needed for nausea or vomiting., Disp: 30 tablet, Rfl: 11 .  Umeclidinium-Vilanterol (ANORO ELLIPTA) 62.5-25 MCG/INH AEPB, Inhale 1 puff into the lungs daily., Disp: 60 each, Rfl: 5  Not on File  Review of Systems  Constitutional: Positive for weight loss. Negative for chills, fever and malaise/fatigue.  HENT: Negative for hearing loss.   Eyes: Negative for blurred vision and double vision.  Respiratory: Positive for cough, sputum production, shortness of breath and wheezing. Negative for hemoptysis.   Cardiovascular: Positive for leg swelling (Left). Negative for chest pain and palpitations.  Gastrointestinal: Negative for abdominal pain, blood in  stool and nausea.  Genitourinary: Positive for frequency and urgency. Negative for dysuria and hematuria.  Skin: Negative for rash.  Neurological: Negative for dizziness, tremors, weakness and headaches.      Objective  Vitals:   09/04/15 1100  BP: (!) 145/67  Pulse: 80  Resp: 14  Temp: 97.7 F (36.5 C)  TempSrc: Oral  Weight: 117 lb 12.8 oz (53.4 kg)  Height: 4\' 10"  (1.473 m)     Physical Exam  Constitutional: She is oriented to person, place, and time and well-developed, well-nourished, and in no distress. No distress.  HENT:  Head: Normocephalic and atraumatic.  Cardiovascular: Normal rate, regular rhythm and normal heart sounds.    Pulmonary/Chest: Effort normal.  Decreased breath sounds throughout.  Musculoskeletal: She exhibits no edema.  Tender over L lateral malleolus and soft tissue below and ant. to malleolus.  Neurological: She is alert and oriented to person, place, and time.  Vitals reviewed.     No results found for this or any previous visit (from the past 2160 hour(s)).   Assessment & Plan  1. Frequency of urination  - POCT urinalysis dipstick-wnl  2. Ankle pain, left X-ray - L ankle

## 2015-09-04 NOTE — Patient Instructions (Signed)
Recommend elastic ankle brace as needed.

## 2015-09-10 ENCOUNTER — Telehealth: Payer: Self-pay | Admitting: Family Medicine

## 2015-09-10 NOTE — Telephone Encounter (Signed)
Kathryn Ruiz with  Hospice called  [phone]     Pt. Need a refill on fentanyl  50 MCG  -------- [facility]

## 2015-09-11 NOTE — Telephone Encounter (Signed)
R/T call to Ivin Booty she says it's been taken care of. A rx was put on hold.[place]

## 2015-09-11 NOTE — Telephone Encounter (Signed)
Thought so.  Thanks.-jh

## 2015-09-11 NOTE — Telephone Encounter (Signed)
I thought that we did that last week.  Call Hospice nurse back and recheck.-jh

## 2015-09-16 ENCOUNTER — Telehealth: Payer: Self-pay | Admitting: Family Medicine

## 2015-09-16 MED ORDER — AMOXICILLIN-POT CLAVULANATE 875-125 MG PO TABS: 1.0000 | ORAL_TABLET | Freq: Two times a day (BID) | ORAL | 0 refills | Status: DC

## 2015-09-16 NOTE — Telephone Encounter (Signed)
Lets start her on some Augmentin, 875 mg., 1 twice a day for 10 days.  We could add prednisone if she does not start to get better quickly.-jh

## 2015-09-16 NOTE — Telephone Encounter (Signed)
Terresa with Hospice said pt has a little more congestion, increased shortness of breath and is coughing up a lot of thick green sputum.  She has been taking 3 nebulizer treatments daily and taking tussin cough syrup 3 times daily.  She does not have fever or chills but Arminda Resides said she has rales while breathing.  Her call back number is [phone]

## 2015-09-16 NOTE — Telephone Encounter (Signed)
Helene Kelp has been notified and she will call the patient. Augmentin sent to pharmacy.

## 2015-09-17 ENCOUNTER — Telehealth: Payer: Self-pay | Admitting: Family Medicine

## 2015-09-17 MED ORDER — AMOXICILLIN-POT CLAVULANATE 600-42.9 MG/5ML PO SUSR: 875.0000 mg | Freq: Two times a day (BID) | ORAL | 0 refills | Status: DC

## 2015-09-17 NOTE — Telephone Encounter (Signed)
Verbal order for liquid given. Please alert the family. It will be ready to day.

## 2015-09-17 NOTE — Telephone Encounter (Signed)
Patient's spouse notified.[place]

## 2015-09-17 NOTE — Telephone Encounter (Signed)
Kathryn Ruiz at [facility] said pt preferred the liquid augmentin.  Please fax a new script to [phone].  Her call back number is [phone]

## 2015-09-17 NOTE — Telephone Encounter (Signed)
Kathryn Ruiz,  Patients husband has called and would like to know if you would look at this for the patient.  [name] call back is [phone]

## 2015-09-25 ENCOUNTER — Telehealth: Payer: Self-pay

## 2015-09-25 MED ORDER — MORPHINE SULFATE (CONCENTRATE) 20 MG/ML PO SOLN: 20.0000 mg | ORAL | 0 refills | Status: DC | PRN

## 2015-09-25 NOTE — Telephone Encounter (Signed)
  Rx printed awaiting MD signature 

## 2015-09-25 NOTE — Telephone Encounter (Signed)
Kathryn Ruiz from [facility] called requesting a refill for Morphine concentrated solution 74ml faxed to [place] with hospice patient written on bottom.  She said that if you wrote for 112ml they will only give 86ml at time and that way you will not have to write so often.  Please call Helene Kelp back when completed at [phone].

## 2015-09-26 DIAGNOSIS — R634 Abnormal weight loss: Secondary | ICD-10-CM | POA: Diagnosis not present

## 2015-09-26 DIAGNOSIS — F329 Major depressive disorder, single episode, unspecified: Secondary | ICD-10-CM | POA: Diagnosis not present

## 2015-09-26 DIAGNOSIS — I509 Heart failure, unspecified: Secondary | ICD-10-CM | POA: Diagnosis not present

## 2015-09-26 DIAGNOSIS — J441 Chronic obstructive pulmonary disease with (acute) exacerbation: Secondary | ICD-10-CM | POA: Diagnosis not present

## 2015-09-26 DIAGNOSIS — R627 Adult failure to thrive: Secondary | ICD-10-CM | POA: Diagnosis not present

## 2015-09-26 DIAGNOSIS — R1310 Dysphagia, unspecified: Secondary | ICD-10-CM | POA: Diagnosis not present

## 2015-09-26 DIAGNOSIS — K219 Gastro-esophageal reflux disease without esophagitis: Secondary | ICD-10-CM | POA: Diagnosis not present

## 2015-09-26 DIAGNOSIS — J8410 Pulmonary fibrosis, unspecified: Secondary | ICD-10-CM | POA: Diagnosis not present

## 2015-09-26 DIAGNOSIS — Z9981 Dependence on supplemental oxygen: Secondary | ICD-10-CM | POA: Diagnosis not present

## 2015-09-26 DIAGNOSIS — M8580 Other specified disorders of bone density and structure, unspecified site: Secondary | ICD-10-CM | POA: Diagnosis not present

## 2015-09-26 DIAGNOSIS — E43 Unspecified severe protein-calorie malnutrition: Secondary | ICD-10-CM | POA: Diagnosis not present

## 2015-09-26 DIAGNOSIS — J9610 Chronic respiratory failure, unspecified whether with hypoxia or hypercapnia: Secondary | ICD-10-CM | POA: Diagnosis not present

## 2015-10-08 ENCOUNTER — Ambulatory Visit (INDEPENDENT_AMBULATORY_CARE_PROVIDER_SITE_OTHER): Payer: Medicare Other

## 2015-10-08 DIAGNOSIS — Z23 Encounter for immunization: Secondary | ICD-10-CM | POA: Diagnosis not present

## 2015-10-09 ENCOUNTER — Telehealth: Payer: Self-pay | Admitting: Family Medicine

## 2015-10-09 MED ORDER — GUAIFENESIN-DM 100-10 MG/5ML PO SYRP: 5.0000 mL | ORAL_SOLUTION | ORAL | 0 refills | Status: DC | PRN

## 2015-10-09 MED ORDER — DOXYCYCLINE HYCLATE 100 MG PO CAPS: 100.0000 mg | ORAL_CAPSULE | Freq: Two times a day (BID) | ORAL | 0 refills | Status: DC

## 2015-10-09 NOTE — Telephone Encounter (Signed)
Spoke with RN. O2 sats have dipped. She is not in acute respiratry distress. Improved with augmentin 1 mos ago. Will trial doxycycline. Add on Robitussin DM to help with cough. To ER or urgent care for respiratory distress, chest pain, fever, other concerning symptoms.

## 2015-10-09 NOTE — Telephone Encounter (Signed)
Sharol Roussel from  Hospice called [phone]   Clarene Critchley states that pt  Is  having shortness of breath, coughing up green mucus having a hard time getting it  up (mucus) . Clarene Critchley states that pt have taken tussin 3x day didn't seem like it was working. Wanted to know if a liquid medication (cough  Medication be called in ) she was also requesting that someone call her back.   Oxygen  was at 91%  02 stats 79-82%

## 2015-10-22 ENCOUNTER — Telehealth: Payer: Self-pay | Admitting: Family Medicine

## 2015-10-22 NOTE — Telephone Encounter (Signed)
Pt.called wanted to know when was the last time she had a pneumonia shot Pt. Call back # is [phone]

## 2015-10-23 NOTE — Telephone Encounter (Signed)
Left message patient has had both pneumonia vaccines. She is up to date.

## 2015-10-24 DIAGNOSIS — H353211 Exudative age-related macular degeneration, right eye, with active choroidal neovascularization: Secondary | ICD-10-CM | POA: Diagnosis not present

## 2015-10-26 DIAGNOSIS — J441 Chronic obstructive pulmonary disease with (acute) exacerbation: Secondary | ICD-10-CM | POA: Diagnosis not present

## 2015-10-26 DIAGNOSIS — J8410 Pulmonary fibrosis, unspecified: Secondary | ICD-10-CM | POA: Diagnosis not present

## 2015-10-26 DIAGNOSIS — I509 Heart failure, unspecified: Secondary | ICD-10-CM | POA: Diagnosis not present

## 2015-10-26 DIAGNOSIS — R1310 Dysphagia, unspecified: Secondary | ICD-10-CM | POA: Diagnosis not present

## 2015-10-26 DIAGNOSIS — M8580 Other specified disorders of bone density and structure, unspecified site: Secondary | ICD-10-CM | POA: Diagnosis not present

## 2015-10-26 DIAGNOSIS — F329 Major depressive disorder, single episode, unspecified: Secondary | ICD-10-CM | POA: Diagnosis not present

## 2015-10-26 DIAGNOSIS — K219 Gastro-esophageal reflux disease without esophagitis: Secondary | ICD-10-CM | POA: Diagnosis not present

## 2015-10-26 DIAGNOSIS — Z9981 Dependence on supplemental oxygen: Secondary | ICD-10-CM | POA: Diagnosis not present

## 2015-10-26 DIAGNOSIS — J9610 Chronic respiratory failure, unspecified whether with hypoxia or hypercapnia: Secondary | ICD-10-CM | POA: Diagnosis not present

## 2015-10-29 ENCOUNTER — Other Ambulatory Visit: Payer: Self-pay | Admitting: Family Medicine

## 2015-10-29 NOTE — Telephone Encounter (Signed)
Ms. Megan Salon called  [phone] requesting a call back  When  Medication was called into the  [facility]     Patches  50 MCG  Morphine

## 2015-10-30 ENCOUNTER — Other Ambulatory Visit: Payer: Self-pay | Admitting: Family Medicine

## 2015-10-30 ENCOUNTER — Other Ambulatory Visit: Payer: Self-pay | Admitting: *Deleted

## 2015-10-30 MED ORDER — FENTANYL 50 MCG/HR TD PT72: 50.0000 ug | MEDICATED_PATCH | TRANSDERMAL | 0 refills | Status: DC

## 2015-10-30 MED ORDER — MORPHINE SULFATE 20 MG/5ML PO SOLN: ORAL | 0 refills | Status: DC

## 2015-10-30 NOTE — Telephone Encounter (Signed)
Kathryn Ruiz.  Fix up these prescriptions so I can sign them please.-jh

## 2015-10-30 NOTE — Telephone Encounter (Signed)
Done

## 2015-10-31 ENCOUNTER — Encounter: Payer: Self-pay | Admitting: Family Medicine

## 2015-10-31 DIAGNOSIS — Z515 Encounter for palliative care: Secondary | ICD-10-CM | POA: Insufficient documentation

## 2015-11-26 DIAGNOSIS — J9610 Chronic respiratory failure, unspecified whether with hypoxia or hypercapnia: Secondary | ICD-10-CM | POA: Diagnosis not present

## 2015-11-26 DIAGNOSIS — Z9981 Dependence on supplemental oxygen: Secondary | ICD-10-CM | POA: Diagnosis not present

## 2015-11-26 DIAGNOSIS — R1310 Dysphagia, unspecified: Secondary | ICD-10-CM | POA: Diagnosis not present

## 2015-11-26 DIAGNOSIS — I509 Heart failure, unspecified: Secondary | ICD-10-CM | POA: Diagnosis not present

## 2015-11-26 DIAGNOSIS — K219 Gastro-esophageal reflux disease without esophagitis: Secondary | ICD-10-CM | POA: Diagnosis not present

## 2015-11-26 DIAGNOSIS — J441 Chronic obstructive pulmonary disease with (acute) exacerbation: Secondary | ICD-10-CM | POA: Diagnosis not present

## 2015-11-26 DIAGNOSIS — J8410 Pulmonary fibrosis, unspecified: Secondary | ICD-10-CM | POA: Diagnosis not present

## 2015-11-26 DIAGNOSIS — M8580 Other specified disorders of bone density and structure, unspecified site: Secondary | ICD-10-CM | POA: Diagnosis not present

## 2015-11-26 DIAGNOSIS — F329 Major depressive disorder, single episode, unspecified: Secondary | ICD-10-CM | POA: Diagnosis not present

## 2015-12-10 ENCOUNTER — Other Ambulatory Visit: Payer: Self-pay | Admitting: *Deleted

## 2015-12-22 ENCOUNTER — Telehealth: Payer: Self-pay | Admitting: Family Medicine

## 2015-12-22 ENCOUNTER — Telehealth: Payer: Self-pay | Admitting: Pulmonary Disease

## 2015-12-22 NOTE — Telephone Encounter (Signed)
Kathryn Ruiz with Hospice scheduled an appt for pt because she will be discharged from services in about 3-4 weeks.  They have not decided on date of discharge but will decide after visit.  She asked if Dr. Luan Pulling could do oxygen recert at visit or do they need to schedule with pulmonary?  Her call back is [phone]

## 2015-12-22 NOTE — Telephone Encounter (Signed)
Qualifying walk 

## 2015-12-22 NOTE — Telephone Encounter (Signed)
Per Dr. Luan Pulling patient needs to have re certification done by pulmonary.

## 2015-12-22 NOTE — Telephone Encounter (Signed)
Disregard 74mw, pt needs qualifying walk in an office visit.

## 2015-12-22 NOTE — Telephone Encounter (Signed)
Need to schedule pt for rov with 26mw prior. lmtcb X1 for pt.

## 2015-12-22 NOTE — Telephone Encounter (Signed)
Pt is being discharged from Hospice soon - coverage for Hospice runs out in the next 3 weeks.  Pt needs to be re-qualified for O2 and nebulizer supplies(orders under a provider with our office) Pt uses O2 24/7 4Liters.  Patient needs an appt with Dr Lake Bells to re-establish care.  Please advise if any testing needs to be done prior to making an appt with you. (qual walk, 6MW etc)

## 2015-12-22 NOTE — Telephone Encounter (Signed)
Needs this done ASAP before her certification runs out

## 2015-12-23 NOTE — Telephone Encounter (Signed)
Spoke with pt. She is aware of what is needed. Pt has been scheduled for her qualifying walk on 01/07/16 at 2pm. This date and time was chosen by the pt. Nothing further was needed.

## 2015-12-26 DIAGNOSIS — J8410 Pulmonary fibrosis, unspecified: Secondary | ICD-10-CM | POA: Diagnosis not present

## 2015-12-26 DIAGNOSIS — Z9981 Dependence on supplemental oxygen: Secondary | ICD-10-CM | POA: Diagnosis not present

## 2015-12-26 DIAGNOSIS — F329 Major depressive disorder, single episode, unspecified: Secondary | ICD-10-CM | POA: Diagnosis not present

## 2015-12-26 DIAGNOSIS — J9610 Chronic respiratory failure, unspecified whether with hypoxia or hypercapnia: Secondary | ICD-10-CM | POA: Diagnosis not present

## 2015-12-26 DIAGNOSIS — I509 Heart failure, unspecified: Secondary | ICD-10-CM | POA: Diagnosis not present

## 2015-12-26 DIAGNOSIS — J441 Chronic obstructive pulmonary disease with (acute) exacerbation: Secondary | ICD-10-CM | POA: Diagnosis not present

## 2015-12-26 DIAGNOSIS — K219 Gastro-esophageal reflux disease without esophagitis: Secondary | ICD-10-CM | POA: Diagnosis not present

## 2015-12-26 DIAGNOSIS — M8580 Other specified disorders of bone density and structure, unspecified site: Secondary | ICD-10-CM | POA: Diagnosis not present

## 2015-12-26 DIAGNOSIS — R1310 Dysphagia, unspecified: Secondary | ICD-10-CM | POA: Diagnosis not present

## 2015-12-29 DIAGNOSIS — I509 Heart failure, unspecified: Secondary | ICD-10-CM | POA: Diagnosis not present

## 2015-12-29 DIAGNOSIS — R1310 Dysphagia, unspecified: Secondary | ICD-10-CM | POA: Diagnosis not present

## 2015-12-29 DIAGNOSIS — J441 Chronic obstructive pulmonary disease with (acute) exacerbation: Secondary | ICD-10-CM | POA: Diagnosis not present

## 2015-12-29 DIAGNOSIS — J8410 Pulmonary fibrosis, unspecified: Secondary | ICD-10-CM | POA: Diagnosis not present

## 2015-12-29 DIAGNOSIS — J9610 Chronic respiratory failure, unspecified whether with hypoxia or hypercapnia: Secondary | ICD-10-CM | POA: Diagnosis not present

## 2015-12-29 DIAGNOSIS — F329 Major depressive disorder, single episode, unspecified: Secondary | ICD-10-CM | POA: Diagnosis not present

## 2015-12-30 DIAGNOSIS — J441 Chronic obstructive pulmonary disease with (acute) exacerbation: Secondary | ICD-10-CM | POA: Diagnosis not present

## 2015-12-30 DIAGNOSIS — F329 Major depressive disorder, single episode, unspecified: Secondary | ICD-10-CM | POA: Diagnosis not present

## 2015-12-30 DIAGNOSIS — R1310 Dysphagia, unspecified: Secondary | ICD-10-CM | POA: Diagnosis not present

## 2015-12-30 DIAGNOSIS — J8410 Pulmonary fibrosis, unspecified: Secondary | ICD-10-CM | POA: Diagnosis not present

## 2015-12-30 DIAGNOSIS — I509 Heart failure, unspecified: Secondary | ICD-10-CM | POA: Diagnosis not present

## 2015-12-30 DIAGNOSIS — J9610 Chronic respiratory failure, unspecified whether with hypoxia or hypercapnia: Secondary | ICD-10-CM | POA: Diagnosis not present

## 2015-12-31 DIAGNOSIS — J8410 Pulmonary fibrosis, unspecified: Secondary | ICD-10-CM | POA: Diagnosis not present

## 2015-12-31 DIAGNOSIS — R1310 Dysphagia, unspecified: Secondary | ICD-10-CM | POA: Diagnosis not present

## 2015-12-31 DIAGNOSIS — I509 Heart failure, unspecified: Secondary | ICD-10-CM | POA: Diagnosis not present

## 2015-12-31 DIAGNOSIS — J441 Chronic obstructive pulmonary disease with (acute) exacerbation: Secondary | ICD-10-CM | POA: Diagnosis not present

## 2015-12-31 DIAGNOSIS — F329 Major depressive disorder, single episode, unspecified: Secondary | ICD-10-CM | POA: Diagnosis not present

## 2015-12-31 DIAGNOSIS — J9610 Chronic respiratory failure, unspecified whether with hypoxia or hypercapnia: Secondary | ICD-10-CM | POA: Diagnosis not present

## 2016-01-02 ENCOUNTER — Other Ambulatory Visit: Payer: Self-pay | Admitting: Family Medicine

## 2016-01-02 DIAGNOSIS — I509 Heart failure, unspecified: Secondary | ICD-10-CM | POA: Diagnosis not present

## 2016-01-02 DIAGNOSIS — J8410 Pulmonary fibrosis, unspecified: Secondary | ICD-10-CM | POA: Diagnosis not present

## 2016-01-02 DIAGNOSIS — R1310 Dysphagia, unspecified: Secondary | ICD-10-CM | POA: Diagnosis not present

## 2016-01-02 DIAGNOSIS — J9610 Chronic respiratory failure, unspecified whether with hypoxia or hypercapnia: Secondary | ICD-10-CM | POA: Diagnosis not present

## 2016-01-02 DIAGNOSIS — F329 Major depressive disorder, single episode, unspecified: Secondary | ICD-10-CM | POA: Diagnosis not present

## 2016-01-02 DIAGNOSIS — J441 Chronic obstructive pulmonary disease with (acute) exacerbation: Secondary | ICD-10-CM | POA: Diagnosis not present

## 2016-01-05 ENCOUNTER — Ambulatory Visit (INDEPENDENT_AMBULATORY_CARE_PROVIDER_SITE_OTHER): Payer: Medicare Other | Admitting: Family Medicine

## 2016-01-05 ENCOUNTER — Encounter: Payer: Self-pay | Admitting: Family Medicine

## 2016-01-05 VITALS — BP 180/60 | HR 76 | Temp 98.1°F | Resp 16 | Ht <= 58 in | Wt 127.0 lb

## 2016-01-05 DIAGNOSIS — R1310 Dysphagia, unspecified: Secondary | ICD-10-CM

## 2016-01-05 DIAGNOSIS — J449 Chronic obstructive pulmonary disease, unspecified: Secondary | ICD-10-CM | POA: Diagnosis not present

## 2016-01-05 DIAGNOSIS — J8410 Pulmonary fibrosis, unspecified: Secondary | ICD-10-CM | POA: Diagnosis not present

## 2016-01-05 DIAGNOSIS — N179 Acute kidney failure, unspecified: Secondary | ICD-10-CM

## 2016-01-05 DIAGNOSIS — J441 Chronic obstructive pulmonary disease with (acute) exacerbation: Secondary | ICD-10-CM | POA: Diagnosis not present

## 2016-01-05 DIAGNOSIS — F329 Major depressive disorder, single episode, unspecified: Secondary | ICD-10-CM | POA: Diagnosis not present

## 2016-01-05 DIAGNOSIS — F419 Anxiety disorder, unspecified: Secondary | ICD-10-CM | POA: Diagnosis not present

## 2016-01-05 DIAGNOSIS — J9610 Chronic respiratory failure, unspecified whether with hypoxia or hypercapnia: Secondary | ICD-10-CM | POA: Diagnosis not present

## 2016-01-05 DIAGNOSIS — I509 Heart failure, unspecified: Secondary | ICD-10-CM | POA: Diagnosis not present

## 2016-01-05 DIAGNOSIS — I10 Essential (primary) hypertension: Secondary | ICD-10-CM | POA: Diagnosis not present

## 2016-01-05 DIAGNOSIS — R1319 Other dysphagia: Secondary | ICD-10-CM

## 2016-01-05 MED ORDER — CLONIDINE HCL 0.1 MG/24HR TD PTWK: 0.1000 mg | MEDICATED_PATCH | TRANSDERMAL | 12 refills | Status: AC

## 2016-01-05 MED ORDER — LORAZEPAM 0.5 MG PO TABS: ORAL_TABLET | ORAL | 5 refills | Status: DC

## 2016-01-05 NOTE — Progress Notes (Signed)
Name: Kathryn Ruiz   MRN: [id number]    DOB: 02/03/1940   Date:01/05/2016       Progress Note  Subjective  Chief Complaint  Chief Complaint  Patient presents with  . hospice    patient being d/c in 3-4 wks    HPI Here for f/iu of terminal COPD after being released from Hospice care  (discharge date in 12/ 15/17.)   She takes chronic Duragesic patch and Morphine daily.  She also has esophageal constriction and cannot be dilated again because of her lungs.  She is to see Dr. Lake Bells in 2 days re: O2 and her opoids.  Asks about hewr Ativan dose.  She is :looopy" with 1/2 of 0.5 mg tab.  Also has chronic  constipation  Needs referral to [facility]  No problem-specific Assessment & Plan notes found for this encounter.   Past Medical History:  Diagnosis Date  . CHF (congestive heart failure) ([place])   . COPD (chronic obstructive pulmonary disease) (HCC)    Dr. Lake Bells -[facility]  . Depression   . Esophageal stricture   . Fibromyalgia   . GERD (gastroesophageal reflux disease)   . H/O hiatal hernia   . Oxygen dependent    07-05-13 oxygen 24/7 -3 l/m daytime, 4 l/m nighttime nasally.    Past Surgical History:  Procedure Laterality Date  . APPENDECTOMY  1954  . CATARACT EXTRACTION  1998   both  . disectomy  2003  . ESOPHAGOGASTRODUODENOSCOPY (EGD) WITH PROPOFOL N/A 07/17/2013   Procedure: ESOPHAGOGASTRODUODENOSCOPY (EGD) WITH PROPOFOL;  Surgeon: Jerene Bears, MD;  Location: [facility];  Service: Gastroenterology;  Laterality: N/A;  . Foot irrigation  2002  . [place]  2001  . SHOULDER SURGERY Left 2010  . sinus surgery  1999  . VARICOSE VEIN SURGERY Bilateral   . vocal cord biopsy  2005    Family History  Problem Relation Age of Onset  . Heart attack Father 45  . Heart disease Mother     Social History   Social History  . Marital status: Married    Spouse name: N/A  . Number of children: 5  . Years of education: N/A   Occupational History  .  Retired     Water engineer   Social History Main Topics  . Smoking status: Former Smoker    Packs/day: 3.00    Years: 50.00    Types: Cigarettes    Quit date: 01/26/1999  . Smokeless tobacco: Never Used  . Alcohol use No  . Drug use: No  . Sexual activity: No   Other Topics Concern  . Not on file   Social History Narrative  . No narrative on file     Current Outpatient Prescriptions:  .  COUGH SYRUP 100 MG/5ML syrup, TAKE 5 TO 10 MLS BY MOUTH EVERY FOUR HOURS AS NEEDED FOR COUGH, Disp: 237 mL, Rfl: 0 .  fentaNYL (DURAGESIC) 50 MCG/HR, Place 1 patch (50 mcg total) onto the skin every 3 (three) days., Disp: 60 patch, Rfl: 0 .  fluticasone (VERAMYST) 27.5 MCG/SPRAY nasal spray, Place 2 sprays into the nose daily as needed for rhinitis or allergies. , Disp: , Rfl:  .  guaiFENesin-codeine (CHERATUSSIN AC) 100-10 MG/5ML syrup, Take 5 mLs by mouth 3 (three) times daily as needed for cough., Disp: 120 mL, Rfl: 0 .  guaiFENesin-dextromethorphan (ROBITUSSIN DM) 100-10 MG/5ML syrup, Take 5 mLs by mouth every 4 (four) hours as needed for cough., Disp: 118 mL, Rfl: 0 .  ipratropium-albuterol (DUONEB) 0.5-2.5 (3) MG/3ML SOLN, Take 3 mLs by nebulization every 4 (four) hours as needed., Disp: 360 mL, Rfl: 11 .  LORazepam (ATIVAN) 0.5 MG tablet, Take 1/4 tablet 3 times a day for anxiety, Disp: 30 tablet, Rfl: 5 .  metoCLOPramide (REGLAN) 5 MG/5ML solution, Take 5 mLs (5 mg total) by mouth 4 (four) times daily -  before meals and at bedtime., Disp: 120 mL, Rfl: 6 .  ondansetron (ZOFRAN) 4 MG tablet, Take 1 tablet (4 mg total) by mouth every 4 (four) hours as needed for nausea or vomiting., Disp: 30 tablet, Rfl: 11 .  pantoprazole (PROTONIX) 40 MG tablet, Take 40 mg by mouth daily., Disp: , Rfl:  .  TUSSIN 100 MG/5ML liquid, TAKE 5 TO 10 MLS BY MOUTH EVERY FOUR HOURS AS NEEDED FOR COUGH, Disp: 237 mL, Rfl: 6 .  cloNIDine (CATAPRES-TTS-1) 0.1 mg/24hr patch, Place 1 patch (0.1 mg total) onto the skin once a  week., Disp: 4 patch, Rfl: 12  No Known Allergies   Review of Systems  Constitutional: Positive for malaise/fatigue and weight loss. Negative for chills and fever.  HENT: Negative for hearing loss and tinnitus.   Eyes: Positive for blurred vision.       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   Cardiovascular: Negative for chest pain, palpitations and leg swelling.  Gastrointestinal: Positive for constipation and nausea. Negative for blood in stool and heartburn.  Genitourinary: Negative for dysuria, frequency and urgency.  Musculoskeletal: Positive for back pain. Negative for myalgias.  Skin: Negative for rash.  Neurological: Negative for dizziness, tingling, tremors, weakness and headaches.  Psychiatric/Behavioral: The patient is nervous/anxious.       Objective  Vitals:   01/05/16 1148 01/05/16 1251  BP: (!) 188/78 (!) 180/60  Pulse: 76   Resp: 16   Temp: 98.1 F (36.7 C)   TempSrc: Oral   Weight: 127 lb (57.6 kg)   Height: 4\' 10"  (1.473 m)     Physical Exam  Constitutional: She is oriented to person, place, and time. She appears distressed.  HENT:  Head: Normocephalic and atraumatic.  Eyes: Conjunctivae and EOM are normal. Pupils are equal, round, and reactive to light. No scleral icterus.  Neck: Normal range of motion. Neck supple. No thyromegaly present.  Cardiovascular: Normal rate and regular rhythm.   Murmur heard.  Systolic murmur is present with a grade of 2/6  throughout  Pulmonary/Chest: Breath sounds normal. She is in respiratory distress (mild at rest). She has no wheezes. She has no rales.  Decreased breath sounds throughout  Musculoskeletal: She exhibits no edema.  Lymphadenopathy:    She has no cervical adenopathy.  Neurological: She is alert and oriented to person, place, and time.  Psychiatric:  Anxious  Vitals reviewed.      No results found for this  or any previous visit (from the past 2160 hour(s)).   Assessment & Plan  Problem List Items Addressed This Visit      Cardiovascular and Mediastinum   HBP (high blood pressure)   Relevant Medications   cloNIDine (CATAPRES-TTS-1) 0.1 mg/24hr patch     Respiratory   COPD (chronic obstructive pulmonary disease) (HCC) - Primary     Digestive   Esophageal dysphagia     Genitourinary   AKI (acute kidney injury) ([place])     Other   Acute anxiety   Relevant Medications   LORazepam (ATIVAN) 0.5 MG  tablet      Meds ordered this encounter  Medications  . pantoprazole (PROTONIX) 40 MG tablet    Sig: Take 40 mg by mouth daily.  Marland Kitchen DISCONTD: LORazepam (ATIVAN) 0.5 MG tablet    Sig: Take 0.5 mg by mouth every 4 (four) hours as needed for anxiety.  Marland Kitchen LORazepam (ATIVAN) 0.5 MG tablet    Sig: Take 1/4 tablet 3 times a day for anxiety    Dispense:  30 tablet    Refill:  5  . cloNIDine (CATAPRES-TTS-1) 0.1 mg/24hr patch    Sig: Place 1 patch (0.1 mg total) onto the skin once a week.    Dispense:  4 patch    Refill:  12   1. Chronic obstructive pulmonary disease, unspecified COPD type ([place]) Refer to Palliative Care Keep appt with Dr. Lake Bells 2. Essential hypertension  - cloNIDine (CATAPRES-TTS-1) 0.1 mg/24hr patch; Place 1 patch (0.1 mg total) onto the skin once a week.  Dispense: 4 patch; Refill: 12  3. Esophageal dysphagia   4. AKI (acute kidney injury) ([place])   5. Acute anxiety  - LORazepam (ATIVAN) 0.5 MG tablet; Take 1/4 tablet 3 times a day for anxiety  Dispense: 30 tablet; Refill: 5

## 2016-01-06 DIAGNOSIS — R1310 Dysphagia, unspecified: Secondary | ICD-10-CM | POA: Diagnosis not present

## 2016-01-06 DIAGNOSIS — J441 Chronic obstructive pulmonary disease with (acute) exacerbation: Secondary | ICD-10-CM | POA: Diagnosis not present

## 2016-01-06 DIAGNOSIS — F329 Major depressive disorder, single episode, unspecified: Secondary | ICD-10-CM | POA: Diagnosis not present

## 2016-01-06 DIAGNOSIS — I509 Heart failure, unspecified: Secondary | ICD-10-CM | POA: Diagnosis not present

## 2016-01-06 DIAGNOSIS — J8410 Pulmonary fibrosis, unspecified: Secondary | ICD-10-CM | POA: Diagnosis not present

## 2016-01-06 DIAGNOSIS — J9610 Chronic respiratory failure, unspecified whether with hypoxia or hypercapnia: Secondary | ICD-10-CM | POA: Diagnosis not present

## 2016-01-07 ENCOUNTER — Ambulatory Visit (INDEPENDENT_AMBULATORY_CARE_PROVIDER_SITE_OTHER): Admitting: Pulmonary Disease

## 2016-01-07 ENCOUNTER — Telehealth: Payer: Self-pay

## 2016-01-07 DIAGNOSIS — I509 Heart failure, unspecified: Secondary | ICD-10-CM | POA: Diagnosis not present

## 2016-01-07 DIAGNOSIS — J441 Chronic obstructive pulmonary disease with (acute) exacerbation: Secondary | ICD-10-CM | POA: Diagnosis not present

## 2016-01-07 DIAGNOSIS — J9611 Chronic respiratory failure with hypoxia: Secondary | ICD-10-CM

## 2016-01-07 DIAGNOSIS — J8410 Pulmonary fibrosis, unspecified: Secondary | ICD-10-CM | POA: Diagnosis not present

## 2016-01-07 DIAGNOSIS — J9610 Chronic respiratory failure, unspecified whether with hypoxia or hypercapnia: Secondary | ICD-10-CM | POA: Diagnosis not present

## 2016-01-07 DIAGNOSIS — R1310 Dysphagia, unspecified: Secondary | ICD-10-CM | POA: Diagnosis not present

## 2016-01-07 DIAGNOSIS — J449 Chronic obstructive pulmonary disease, unspecified: Secondary | ICD-10-CM

## 2016-01-07 DIAGNOSIS — R0609 Other forms of dyspnea: Principal | ICD-10-CM

## 2016-01-07 DIAGNOSIS — F329 Major depressive disorder, single episode, unspecified: Secondary | ICD-10-CM | POA: Diagnosis not present

## 2016-01-07 NOTE — Telephone Encounter (Signed)
Pt came in for re qualifying walk, pt states she is being discharged from hospice on Friday. Pt currently on morphine 0.25-25ml every hour prn and fentanyl patch every 72hr. Pt would like to know if BQ would start prescribing these medications, pt PCP is currently prescribing these while under hospice care, Dr. Luan Pulling will not be able to do this long term.  If BQ is unable to do this, pt will need to be referred to pain management ASAP as her her last patch is on christmas day.  BQ please advise.

## 2016-01-07 NOTE — Progress Notes (Signed)
Pt came in for qualifying walk not SMW.

## 2016-01-07 NOTE — Telephone Encounter (Signed)
Also pt came in for qualifying walk and desat to 86 on 4L, liter flow was increased 5L and pt maintained at 91% BQ are you okay with placing this order with 5L with exertion and 4L @ rest and qhs?

## 2016-01-08 NOTE — Telephone Encounter (Signed)
Yes to both questions about morphine and oxygen

## 2016-01-08 NOTE — Telephone Encounter (Signed)
Mr. Garant (husband) called stating oxygen is needed at the home before Hospice removes their oxygen supply.patient will be discharged Friday (01/09/16)...contact # [phone].Marland KitchenMearl Latin

## 2016-01-08 NOTE — Telephone Encounter (Signed)
Called and spoke with pts husband and he is aware of order placed for the oxygen with [facility]. This was an urgent request.

## 2016-01-09 ENCOUNTER — Telehealth: Payer: Self-pay | Admitting: Pulmonary Disease

## 2016-01-09 DIAGNOSIS — I509 Heart failure, unspecified: Secondary | ICD-10-CM | POA: Diagnosis not present

## 2016-01-09 DIAGNOSIS — J449 Chronic obstructive pulmonary disease, unspecified: Secondary | ICD-10-CM

## 2016-01-09 DIAGNOSIS — J9610 Chronic respiratory failure, unspecified whether with hypoxia or hypercapnia: Secondary | ICD-10-CM | POA: Diagnosis not present

## 2016-01-09 DIAGNOSIS — J441 Chronic obstructive pulmonary disease with (acute) exacerbation: Secondary | ICD-10-CM | POA: Diagnosis not present

## 2016-01-09 DIAGNOSIS — R1310 Dysphagia, unspecified: Secondary | ICD-10-CM | POA: Diagnosis not present

## 2016-01-09 DIAGNOSIS — F329 Major depressive disorder, single episode, unspecified: Secondary | ICD-10-CM | POA: Diagnosis not present

## 2016-01-09 DIAGNOSIS — H353132 Nonexudative age-related macular degeneration, bilateral, intermediate dry stage: Secondary | ICD-10-CM | POA: Diagnosis not present

## 2016-01-09 DIAGNOSIS — J8410 Pulmonary fibrosis, unspecified: Secondary | ICD-10-CM | POA: Diagnosis not present

## 2016-01-09 DIAGNOSIS — R0609 Other forms of dyspnea: Secondary | ICD-10-CM

## 2016-01-09 NOTE — Telephone Encounter (Signed)
Melissa from [facility] request to leave an urgent message. Order placed was  an generic referral. Melissa need the actual oxygen order with an electronic signature.

## 2016-01-09 NOTE — Telephone Encounter (Signed)
Yes I will sign. 

## 2016-01-09 NOTE — Telephone Encounter (Signed)
Per Magda Paganini, order was signed and faxed to [facility].  Pt aware that order faxed. Nothing further needed.

## 2016-01-09 NOTE — Telephone Encounter (Signed)
Spoke with patient husband, states that they have not received the O2 that was prescribed for the patient and have not heard [facility]. Per message below [facility] has the order that was sent but it is not in the required template. Order placed again and per Lenna Sciara this will have to be signed prior to being processed. Melissa requests that a MD in office sign in his absence if available so that they can process this request and get the patient the O2 today. Will send to Dr Melvyn Novas to see if he is okay signing this O2 order if we print it for him so that we can get it faxed back to [facility] asap. Please advise Dr Melvyn Novas. Thanks.

## 2016-01-09 NOTE — Telephone Encounter (Signed)
Rx printed for new O2 start this has been given to Dr Gustavus Bryant nurse Magda Paganini to have MW sign.  This needs to be faxed to [facility] [phone] when signed. I have called and let them know this is coming soon.  Spoke with Apolonio Schneiders and she is letting the RT know this order is coming today.

## 2016-01-13 ENCOUNTER — Telehealth: Payer: Self-pay | Admitting: Pulmonary Disease

## 2016-01-13 MED ORDER — FENTANYL 50 MCG/HR TD PT72: 50.0000 ug | MEDICATED_PATCH | TRANSDERMAL | 0 refills | Status: DC

## 2016-01-13 NOTE — Telephone Encounter (Signed)
[phone] please let him know when the rx is ready

## 2016-01-13 NOTE — Telephone Encounter (Signed)
BQ pt does not have morphine on her med list.  Please advise of the dosing of the morphine and mg to be written for and these rx will need to be signed by you as well.  Please advise when you would be able to come by the office to sign these?  thanks

## 2016-01-13 NOTE — Telephone Encounter (Signed)
Yes, OK to refill 

## 2016-01-13 NOTE — Telephone Encounter (Signed)
Called and spoke to pt's husband, [name]. Pt is requesting a refill on Fentanyl and morphine. Pt was told by PCP, Dr. Luan Pulling, that he can no longer fill either of these medications since pt is no longer with Hospice. Fentanyl (1 patch every 3 days) last filled on 10.5.17 by Dr. Luan Pulling for 60 patches 0 refills. Pt is requesting this be answered prior to weekend.   Dr. Lake Bells please advise if you are ok refilling this. Thanks.

## 2016-01-14 NOTE — Telephone Encounter (Signed)
She is going to need an office visit to sort this out

## 2016-01-14 NOTE — Telephone Encounter (Signed)
Pt scheduled with SG on 01-15-16. Pt aware and voiced her understanding. Nothing further needed.

## 2016-01-15 ENCOUNTER — Telehealth: Payer: Self-pay | Admitting: Family Medicine

## 2016-01-15 ENCOUNTER — Encounter: Payer: Self-pay | Admitting: Acute Care

## 2016-01-15 ENCOUNTER — Ambulatory Visit (INDEPENDENT_AMBULATORY_CARE_PROVIDER_SITE_OTHER): Payer: Medicare Other | Admitting: Acute Care

## 2016-01-15 DIAGNOSIS — Z515 Encounter for palliative care: Secondary | ICD-10-CM

## 2016-01-15 DIAGNOSIS — J9611 Chronic respiratory failure with hypoxia: Secondary | ICD-10-CM

## 2016-01-15 MED ORDER — FENTANYL 50 MCG/HR TD PT72: 50.0000 ug | MEDICATED_PATCH | TRANSDERMAL | 0 refills | Status: DC

## 2016-01-15 NOTE — Telephone Encounter (Signed)
Pt said Dr. Lake Bells filled medication to last through holidays.  Her call back number is [phone]

## 2016-01-15 NOTE — Assessment & Plan Note (Signed)
Continue wearing oxygen at 5L during the day and 4L at night.

## 2016-01-15 NOTE — Telephone Encounter (Signed)
Dr. Luan Pulling and patient discussed this at her last visit.[place]

## 2016-01-15 NOTE — Patient Instructions (Addendum)
It is nice to meet you today. We will prescribe your Fentanyl Patches today. We will add the morphine to your medication list. Please replace Fentanyl patches every 3 days, taking previous patch off before applying the new patch. It is great you are seeing Palliative Care today. Let us know if they have a physician on staff who can manage morphine and Fentanyl Prescriptions. If not, let us know. Follow up with Dr. Lake Bells in 8 weeks, or his first available. Please contact office for sooner follow up if symptoms do not improve or worsen or seek emergency care

## 2016-01-15 NOTE — Assessment & Plan Note (Addendum)
Discharged from [facility] 01/09/16 as she no longer meets the new Medicare Criteria Concern over who will manage narcotic medications 2  Fentanyl Patches left 60 cc of Morphine 20 mg/ml on hand. Dr. Lake Bells has agreed  to manage ( per telephone note ) if Palliative care services do not have an MD to manage. Plan:  We will prescribe your Fentanyl Patches today. [employer] signed Rx) Please replace every 3 days, taking previous patch off before applying the new patch. We will add the morphine to your medication list. It is great you are seeing Palliative Care today. Let us know if they have a physician on staff who can manage morphine and Fentanyl Prescriptions. If not, let us know. Follow up with Dr. Lake Bells in 8 weeks, or his first available. Please contact office for sooner follow up if symptoms do not improve or worsen or seek emergency care

## 2016-01-15 NOTE — Progress Notes (Signed)
History of Present Illness Kathryn Ruiz is a 75 y.o. female former smoker with End Stage COPD and Pulmonary Fibrosis followed by Dr. Fayne Mediate.   01/15/2016 Post Hospice Discharge OV for medication management: Pt. Presents to the office today after requesting that Dr. Lake Bells manage her morphine and fentanyl prescriptions after discharge from hospice on 01/09/2016. She was discharged from Hospice due to the fact she no longer meets the criteria based on new Medicare Guidelines for hospice. The two major criteria are that  the patient must be within 6 months of demise and health must be in a declining pattern. Her health has currently stabilized, and therefore Hospice has discharged her. While in Hospice her narcotic  medication was managed  by Dr. Luan Pulling, who has told them he can no longer manage her medications  due to the fact she is no longer a part of hospice. Dr. Lake Bells did state per phone note that he would agree to manage her medications. The patient is here with her husband today to assure continued medication management since discharge from Hospice.She  is in no apparent distress on her 5 L [place]. She confirms use of  4 L at night. She is emotional about having been discharged from Hospice, but hopeful that Palliative care will work with her.She denies shortness of breath, chest pain, orthopnea or hemoptysis. She states she is at her baseline as long as she has her medications to manage her pain and dyspnea. She denies chest pain, fever, orthopnea or hemoptysis, no recent auto or airline travel.  Past medical hx Past Medical History:  Diagnosis Date  . CHF (congestive heart failure) ([place])   . COPD (chronic obstructive pulmonary disease) (HCC)    Dr. Lake Bells -[facility]  . Depression   . Esophageal stricture   . Fibromyalgia   . GERD (gastroesophageal reflux disease)   . H/O hiatal hernia   . Oxygen dependent    07-05-13 oxygen 24/7 -3 l/m daytime, 4 l/m nighttime nasally.       Past surgical hx, Family hx, Social hx all reviewed.  Current Outpatient Prescriptions on File Prior to Visit  Medication Sig  . cloNIDine (CATAPRES-TTS-1) 0.1 mg/24hr patch Place 1 patch (0.1 mg total) onto the skin once a week.  . COUGH SYRUP 100 MG/5ML syrup TAKE 5 TO 10 MLS BY MOUTH EVERY FOUR HOURS AS NEEDED FOR COUGH  . fluticasone (VERAMYST) 27.5 MCG/SPRAY nasal spray Place 2 sprays into the nose daily as needed for rhinitis or allergies.   Marland Kitchen guaiFENesin-codeine (CHERATUSSIN AC) 100-10 MG/5ML syrup Take 5 mLs by mouth 3 (three) times daily as needed for cough.  Marland Kitchen guaiFENesin-dextromethorphan (ROBITUSSIN DM) 100-10 MG/5ML syrup Take 5 mLs by mouth every 4 (four) hours as needed for cough.  Marland Kitchen ipratropium-albuterol (DUONEB) 0.5-2.5 (3) MG/3ML SOLN Take 3 mLs by nebulization every 4 (four) hours as needed.  Marland Kitchen LORazepam (ATIVAN) 0.5 MG tablet Take 1/4 tablet 3 times a day for anxiety  . metoCLOPramide (REGLAN) 5 MG/5ML solution Take 5 mLs (5 mg total) by mouth 4 (four) times daily -  before meals and at bedtime.  . ondansetron (ZOFRAN) 4 MG tablet Take 1 tablet (4 mg total) by mouth every 4 (four) hours as needed for nausea or vomiting.  . pantoprazole (PROTONIX) 40 MG tablet Take 40 mg by mouth daily.  . TUSSIN 100 MG/5ML liquid TAKE 5 TO 10 MLS BY MOUTH EVERY FOUR HOURS AS NEEDED FOR COUGH   No current facility-administered medications on  file prior to visit.      No Known Allergies  Review Of Systems:  Constitutional:   No  weight loss, night sweats,  Fevers, chills, fatigue, or  lassitude.  HEENT:   No headaches,  Difficulty swallowing,  Tooth/dental problems, or  Sore throat,                No sneezing, itching, ear ache, nasal congestion, post nasal drip,   CV:  No chest pain,  Orthopnea, PND, swelling in lower extremities, anasarca, dizziness, palpitations, syncope.   GI  No heartburn, indigestion, abdominal pain, nausea, vomiting, diarrhea, change in bowel habits, loss of  appetite, bloody stools.   Resp: + shortness of breath with exertion less at rest.  Baseline  excess mucus, baseline productive cough,  No non-productive cough,  No coughing up of blood.  No change in color of mucus.  + wheezing.  No chest wall deformity  Skin: no rash or lesions.  GU: no dysuria, change in color of urine, no urgency or frequency.  No flank pain, no hematuria   MS:  No joint pain or swelling.  No decreased range of motion.  No back pain.  Psych:  No change in mood or affect. + depression + anxiety.  No memory loss.   Vital Signs BP (!) 178/100 (BP Location: Left Arm, Cuff Size: Normal)   Pulse 81   Ht 4\' 10"  (1.473 m)   Wt 127 lb (57.6 kg)   BMI 26.54 kg/m    Physical Exam:  General- No distress,  A&Ox3, anxious frail female ENT: No sinus tenderness, TM clear, pale nasal mucosa, no oral exudate,no post nasal drip, no LAN Cardiac: S1, S2, regular rate and rhythm, no murmur Chest: No wheeze/ rales/ dullness; no accessory muscle use, no nasal flaring, no sternal retractions Abd.: Soft Non-tender Ext: No clubbing cyanosis, edema Neuro:  Deconditioned at baseline Skin: No rashes, warm and dry Psych: Anxious and emotional    Assessment/Plan  Hospice care patient Discharged from [facility] 01/09/16 as she no longer meets the new Medicare Criteria Concern over who will manage narcotic medications 2  Fentanyl Patches left 60 cc of Morphine 20 mg/ml on hand. Dr. Lake Bells has agreed  to manage ( per telephone note ) if Palliative care services do not have an MD to manage. Plan:  We will prescribe your Fentanyl Patches today. [employer] signed Rx) Please replace every 3 days, taking previous patch off before applying the new patch. We will add the morphine to your medication list. It is great you are seeing Palliative Care today. Let us know if they have a physician on staff who can manage morphine and Fentanyl Prescriptions. If not, let us know. Follow up with Dr.  Lake Bells in 8 weeks, or his first available. Please contact office for sooner follow up if symptoms do not improve or worsen or seek emergency care    Chronic hypoxemic respiratory failure (HCC) Continue wearing oxygen at 5L during the day and 4L at night.    Magdalen Spatz, NP 01/15/2016  2:05 PM

## 2016-01-16 ENCOUNTER — Ambulatory Visit (INDEPENDENT_AMBULATORY_CARE_PROVIDER_SITE_OTHER): Payer: Medicare Other | Admitting: Family Medicine

## 2016-01-16 ENCOUNTER — Encounter: Payer: Self-pay | Admitting: Family Medicine

## 2016-01-16 VITALS — BP 152/70 | HR 73 | Temp 98.0°F | Resp 16 | Ht <= 58 in | Wt 126.4 lb

## 2016-01-16 DIAGNOSIS — B029 Zoster without complications: Secondary | ICD-10-CM

## 2016-01-16 MED ORDER — MUPIROCIN 2 % EX OINT: 1.0000 "application " | TOPICAL_OINTMENT | Freq: Two times a day (BID) | CUTANEOUS | 0 refills | Status: DC

## 2016-01-16 MED ORDER — VALACYCLOVIR HCL 1 G PO TABS: 1000.0000 mg | ORAL_TABLET | Freq: Three times a day (TID) | ORAL | 0 refills | Status: DC

## 2016-01-16 NOTE — Assessment & Plan Note (Addendum)
Suspected shingles outbreak with perioral facial lesions (confused by one lesion on either side of mouth, may be less likely), but still similar appearance, onset trigger likely acute stress with discharge from hospice, admits significant emotional stress, also supported by characteristic symptoms of tingling, burning, as expected s/p shingles vaccine less severe symptoms, and on chronic opiates (hospice now palliative care). - Differential includes skin sores possibly from excessive moisture drooling and nasal discharge, does not seem to have secondary infection  Plan: 1. Empiric coverage with Valtrex 1g TID for 7 days as discussed, given possible shingles flare, mutual agreement on proceeding with this treatment 2. Also given rx Mupirocin ointment BID 7 days, start in a few days if worsening appearance of possible superficial infection 3. Reviewed routine hygiene, keep area dry 4. Follow-up in future if unresolved or changing, if lingering neuropathic symptoms from possible postherpetic neuralgia consider gabapentin or alternatives

## 2016-01-16 NOTE — Progress Notes (Signed)
Subjective:    Patient ID: Kathryn Ruiz, female    DOB: March 10, 1940, 75 y.o.   MRN: [id number]  Kathryn Ruiz is a 75 y.o. female presenting on 01/16/2016 for Mouth Lesions (spreading onset yesterday )   HPI  Facial Rash (Peri-oral) - Reports new finding with rash around mouth over past 1-2 days, described as raised blister-like cold sore almost, uncomfortable and itching, also with some tingling and burning, seemed to spread yesterday to near mouth, initially only under nose. No significant changes recently with regards to medications (except was started on a clonidine patch for BP), no new exposures, soaps detergents. - Admits to scratching spots a few times - Recent significant stressor with leaving hospice recently, and trying to transfer medications and treatments from hospice to other doctors, such as pulmonary and palliative care, currently taking fentanyl patch and morphine - S/p Shingles vaccine about 10 years ago, never had outbreak of shingles before. - Denies any fevers/chills, oral ulcers or lesions, other rash, headache, nausea, vomiting, drainage of pus  Social History  Substance Use Topics  . Smoking status: Former Smoker    Packs/day: 3.00    Years: 50.00    Types: Cigarettes    Quit date: 01/26/1999  . Smokeless tobacco: Never Used  . Alcohol use No    Review of Systems Per HPI unless specifically indicated above     Objective:    BP (!) 152/70   Pulse 73   Temp 98 F (36.7 C) (Oral)   Resp 16   Ht 4\' 10"  (1.473 m)   Wt 126 lb 6.4 oz (57.3 kg)   SpO2 94%   BMI 26.42 kg/m   Wt Readings from Last 3 Encounters:  01/16/16 126 lb 6.4 oz (57.3 kg)  01/15/16 127 lb (57.6 kg)  01/05/16 127 lb (57.6 kg)    Physical Exam  Constitutional: She appears well-developed and well-nourished. No distress.  Chronically ill and frail appearing, currently comfortable, cooperative  HENT:  Head: Normocephalic and atraumatic.  Portable oxygen  Oropharynx and oral  mucosa is clear without any ulcerations, lesions, masses. Moist mucus mem.  Upper and lower dentures in place.  Eyes: Conjunctivae are normal. Right eye exhibits no discharge. Left eye exhibits no discharge.  Neck: Neck supple.  Neurological: She is alert.  Skin: Skin is warm and dry. Rash (Peri-oral rash with 3 distinct lesions < 1 x 1 cm each with appearance of healing scab, possibly coalesced skin lesions, without induration, erythema, tenderness) noted. She is not diaphoretic.  Psychiatric: Her behavior is normal.  Nursing note and vitals reviewed.             Assessment & Plan:   Problem List Items Addressed This Visit    Herpes zoster without complication - Primary    Suspected shingles outbreak with perioral facial lesions (confused by one lesion on either side of mouth, may be less likely), but still similar appearance, onset trigger likely acute stress with discharge from hospice, admits significant emotional stress, also supported by characteristic symptoms of tingling, burning, as expected s/p shingles vaccine less severe symptoms, and on chronic opiates (hospice now palliative care). - Differential includes skin sores possibly from excessive moisture drooling and nasal discharge, does not seem to have secondary infection  Plan: 1. Empiric coverage with Valtrex 1g TID for 7 days as discussed, given possible shingles flare, mutual agreement on proceeding with this treatment 2. Also given rx Mupirocin ointment BID 7 days, start in a few  days if worsening appearance of possible superficial infection 3. Reviewed routine hygiene, keep area dry 4. Follow-up in future if unresolved or changing, if lingering neuropathic symptoms from possible postherpetic neuralgia consider gabapentin or alternatives      Relevant Medications   valACYclovir (VALTREX) 1000 MG tablet   mupirocin ointment (BACTROBAN) 2 %      Meds ordered this encounter  Medications  . valACYclovir (VALTREX)  1000 MG tablet    Sig: Take 1 tablet (1,000 mg total) by mouth 3 (three) times daily.    Dispense:  30 tablet    Refill:  0  . mupirocin ointment (BACTROBAN) 2 %    Sig: Apply 1 application topically 2 (two) times daily. For up to 7 days    Dispense:  22 g    Refill:  0      Follow up plan: Return in about 4 weeks (around 02/13/2016), or if symptoms worsen or fail to improve, for oral lesions.  Nobie Putnam, DO [facility] 01/16/2016, 1:23 PM

## 2016-01-16 NOTE — Patient Instructions (Signed)
Thank you for coming in to clinic today.  1. I am concerned that this might be a Shingles outbreak - Go ahead and cover with the Valtrex 1 pill 3 times a day for 7 days then stop, ask pharmacist if you can crush or half pill - May occur with times of acute stress - Also if after a few days if still not resolving, you can use topical antibacterial ointment twice a day for 7 days then stop this is coverage for possible Bacterial infection  If turns out to be shingles, then you can follow-up in future if having residual numbness, tingling, burning pain, we can consider nerve medications such as Gabapentin  Please schedule a follow-up appointment with Dr. Luan Pulling / Dr Luan Pulling anytime in next 2-4 weeks as needed follow-up oral lesions  If you have any other questions or concerns, please feel free to call the clinic or send a message through [facility]. You may also schedule an earlier appointment if necessary.  Nobie Putnam, DO [facility]

## 2016-01-22 NOTE — Progress Notes (Signed)
Reviewed, agree 

## 2016-01-28 ENCOUNTER — Other Ambulatory Visit: Payer: Self-pay | Admitting: Family Medicine

## 2016-02-02 ENCOUNTER — Ambulatory Visit (INDEPENDENT_AMBULATORY_CARE_PROVIDER_SITE_OTHER): Payer: PPO | Admitting: Family Medicine

## 2016-02-02 ENCOUNTER — Encounter: Payer: Self-pay | Admitting: Family Medicine

## 2016-02-02 VITALS — BP 150/60 | HR 83 | Temp 98.6°F | Resp 18 | Ht <= 58 in | Wt 127.0 lb

## 2016-02-02 DIAGNOSIS — J84112 Idiopathic pulmonary fibrosis: Secondary | ICD-10-CM | POA: Diagnosis not present

## 2016-02-02 DIAGNOSIS — J449 Chronic obstructive pulmonary disease, unspecified: Secondary | ICD-10-CM

## 2016-02-02 DIAGNOSIS — F329 Major depressive disorder, single episode, unspecified: Secondary | ICD-10-CM

## 2016-02-02 DIAGNOSIS — K5900 Constipation, unspecified: Secondary | ICD-10-CM | POA: Diagnosis not present

## 2016-02-02 DIAGNOSIS — J9611 Chronic respiratory failure with hypoxia: Secondary | ICD-10-CM

## 2016-02-02 DIAGNOSIS — R5383 Other fatigue: Secondary | ICD-10-CM

## 2016-02-02 DIAGNOSIS — I10 Essential (primary) hypertension: Secondary | ICD-10-CM

## 2016-02-02 MED ORDER — POLYETHYLENE GLYCOL 3350 17 GM/SCOOP PO POWD: 17.0000 g | Freq: Two times a day (BID) | ORAL | 1 refills | Status: AC | PRN

## 2016-02-02 MED ORDER — AMITRIPTYLINE HCL 10 MG PO TABS: 10.0000 mg | ORAL_TABLET | Freq: Every day | ORAL | 6 refills | Status: DC

## 2016-02-02 NOTE — Progress Notes (Signed)
Name: Kathryn Ruiz   MRN: [id number]    DOB: 18-Feb-1940   Date:02/02/2016       Progress Note  Subjective  Chief Complaint  Chief Complaint  Patient presents with  . Follow-up  . Herpes Zoster    HPI Here for f/u of HBP and her Pallative care .  Palliative care is involved now.  O2 has been ordered by Pul and Fentanyl and Morphine turned over to Pul.  She is stable except for a little nausea this AM.  She did not eat wsith her Morphine this AM. Patient does c/o feeling down and blue all the time.  No problem-specific Assessment & Plan notes found for this encounter.   Past Medical History:  Diagnosis Date  . CHF (congestive heart failure) ([place])   . COPD (chronic obstructive pulmonary disease) (HCC)    Dr. Lake Bells -[facility]  . Depression   . Esophageal stricture   . Fibromyalgia   . GERD (gastroesophageal reflux disease)   . H/O hiatal hernia   . Oxygen dependent    07-05-13 oxygen 24/7 -3 l/m daytime, 4 l/m nighttime nasally.    Past Surgical History:  Procedure Laterality Date  . APPENDECTOMY  1954  . CATARACT EXTRACTION  1998   both  . disectomy  2003  . ESOPHAGOGASTRODUODENOSCOPY (EGD) WITH PROPOFOL N/A 07/17/2013   Procedure: ESOPHAGOGASTRODUODENOSCOPY (EGD) WITH PROPOFOL;  Surgeon: Jerene Bears, MD;  Location: [facility];  Service: Gastroenterology;  Laterality: N/A;  . Foot irrigation  2002  . [place]  2001  . SHOULDER SURGERY Left 2010  . sinus surgery  1999  . VARICOSE VEIN SURGERY Bilateral   . vocal cord biopsy  2005    Family History  Problem Relation Age of Onset  . Heart attack Father 61  . Heart disease Mother     Social History   Social History  . Marital status: Married    Spouse name: N/A  . Number of children: 5  . Years of education: N/A   Occupational History  . Retired     Water engineer   Social History Main Topics  . Smoking status: Former Smoker    Packs/day: 3.00    Years: 50.00    Types: Cigarettes    Quit  date: 01/26/1999  . Smokeless tobacco: Never Used  . Alcohol use No  . Drug use: No  . Sexual activity: No   Other Topics Concern  . Not on file   Social History Narrative  . No narrative on file     Current Outpatient Prescriptions:  .  cloNIDine (CATAPRES-TTS-1) 0.1 mg/24hr patch, Place 1 patch (0.1 mg total) onto the skin once a week., Disp: 4 patch, Rfl: 12 .  COUGH SYRUP 100 MG/5ML syrup, TAKE 5 TO 10 MLS BY MOUTH EVERY FOUR HOURS AS NEEDED FOR COUGH, Disp: 237 mL, Rfl: 0 .  fentaNYL (DURAGESIC) 50 MCG/HR, Place 1 patch (50 mcg total) onto the skin every 3 (three) days. For pain and dyspnea, Disp: 60 patch, Rfl: 0 .  fluticasone (VERAMYST) 27.5 MCG/SPRAY nasal spray, Place 2 sprays into the nose daily as needed for rhinitis or allergies. , Disp: , Rfl:  .  guaiFENesin-codeine (CHERATUSSIN AC) 100-10 MG/5ML syrup, Take 5 mLs by mouth 3 (three) times daily as needed for cough., Disp: 120 mL, Rfl: 0 .  guaiFENesin-dextromethorphan (ROBITUSSIN DM) 100-10 MG/5ML syrup, Take 5 mLs by mouth every 4 (four) hours as needed for cough., Disp: 118 mL, Rfl: 0 .  ipratropium-albuterol (DUONEB) 0.5-2.5 (3) MG/3ML SOLN, Take 3 mLs by nebulization every 4 (four) hours as needed., Disp: 360 mL, Rfl: 11 .  LORazepam (ATIVAN) 0.5 MG tablet, Take 1/4 tablet 3 times a day for anxiety, Disp: 30 tablet, Rfl: 5 .  Melatonin 3 MG TABS, TAKE ONE TABLET BY MOUTH AT BEDTIME AS NEEDED FOR SLEEP, Disp: 30 each, Rfl: 6 .  metoCLOPramide (REGLAN) 5 MG/5ML solution, Take 5 mLs (5 mg total) by mouth 4 (four) times daily -  before meals and at bedtime., Disp: 120 mL, Rfl: 6 .  morphine (ROXANOL) 20 MG/ML concentrated solution, Take 20 mg by mouth every 2 (two) hours as needed for severe pain., Disp: , Rfl:  .  morphine 10 MG/5ML solution, Take by mouth every 2 (two) hours as needed for severe pain., Disp: , Rfl:  .  ondansetron (ZOFRAN) 4 MG tablet, Take 1 tablet (4 mg total) by mouth every 4 (four) hours as needed for  nausea or vomiting., Disp: 30 tablet, Rfl: 11 .  pantoprazole (PROTONIX) 40 MG tablet, Take 40 mg by mouth daily., Disp: , Rfl:  .  TUSSIN 100 MG/5ML liquid, TAKE 5 TO 10 MLS BY MOUTH EVERY FOUR HOURS AS NEEDED FOR COUGH, Disp: 237 mL, Rfl: 6 .  amitriptyline (ELAVIL) 10 MG tablet, Take 1 tablet (10 mg total) by mouth at bedtime., Disp: 30 tablet, Rfl: 6 .  polyethylene glycol powder (GLYCOLAX/MIRALAX) powder, Take 17 g by mouth 2 (two) times daily as needed., Disp: 3350 g, Rfl: 1  Not on File   Review of Systems  Constitutional: Positive for malaise/fatigue. Negative for chills and fever.  HENT: Negative for hearing loss and tinnitus.   Eyes: Negative for blurred vision and double vision.  Respiratory: Positive for cough, sputum production, shortness of breath and wheezing.   Cardiovascular: Negative for chest pain, palpitations and leg swelling.  Gastrointestinal: Positive for constipation and nausea. Negative for abdominal pain, blood in stool and heartburn.  Genitourinary: Negative for dysuria, frequency and urgency.  Musculoskeletal: Negative for joint pain and myalgias.  Skin: Negative for rash.  Neurological: Positive for weakness. Negative for dizziness, tingling, tremors and headaches.  Psychiatric/Behavioral: Positive for depression. The patient is not nervous/anxious and does not have insomnia.       Objective  Vitals:   02/02/16 1005 02/02/16 1052  BP: (!) 168/69 (!) 150/60  Pulse: 83   Resp: 18   Temp: 98.6 F (37 C)   TempSrc: Oral   Weight: 127 lb (57.6 kg)   Height: 4\' 10"  (1.473 m)     Physical Exam  Constitutional: She is oriented to person, place, and time.  Cachetic looking  HENT:  Head: Normocephalic and atraumatic.  Neck: Carotid bruit is present (R). No thyromegaly present.  Cardiovascular: Normal rate, regular rhythm and normal heart sounds.   Pulmonary/Chest: Effort normal and breath sounds normal. No respiratory distress. She has no wheezes. She  has no rales.  On O2 via nasal canula  Musculoskeletal: She exhibits no edema.  Lymphadenopathy:    She has no cervical adenopathy.  Neurological: She is alert and oriented to person, place, and time.  Psychiatric:  Affect is mildly depressed.       No results found for this or any previous visit (from the past 2160 hour(s)).   Assessment & Plan  Problem List Items Addressed This Visit      Cardiovascular and Mediastinum   HBP (high blood pressure)     Respiratory  COPD (chronic obstructive pulmonary disease) (HCC)   Chronic hypoxemic respiratory failure (HCC)   IPF (idiopathic pulmonary fibrosis) (HCC)     Digestive   Constipation   Relevant Medications   polyethylene glycol powder (GLYCOLAX/MIRALAX) powder     Other   Fatigue due to depression - Primary   Relevant Medications   amitriptyline (ELAVIL) 10 MG tablet      Meds ordered this encounter  Medications  . amitriptyline (ELAVIL) 10 MG tablet    Sig: Take 1 tablet (10 mg total) by mouth at bedtime.    Dispense:  30 tablet    Refill:  6  . polyethylene glycol powder (GLYCOLAX/MIRALAX) powder    Sig: Take 17 g by mouth 2 (two) times daily as needed.    Dispense:  3350 g    Refill:  1   1. Fatigue due to depression  - amitriptyline (ELAVIL) 10 MG tablet; Take 1 tablet (10 mg total) by mouth at bedtime.  Dispense: 30 tablet; Refill: 6  2. Constipation, unspecified constipation type  - polyethylene glycol powder (GLYCOLAX/MIRALAX) powder; Take 17 g by mouth 2 (two) times daily as needed.  Dispense: 3350 g; Refill: 1  3. Essential hypertension cont Clonidine patch  4. Chronic hypoxemic respiratory failure (HCC) Cont Pulmonary  5. IPF (idiopathic pulmonary fibrosis) (HCC) Cont Pulmonary  6. Chronic obstructive pulmonary disease, unspecified COPD type ([place])

## 2016-02-02 NOTE — Patient Instructions (Signed)
Monitor BP once daily

## 2016-02-07 ENCOUNTER — Other Ambulatory Visit: Payer: Self-pay | Admitting: Family Medicine

## 2016-02-16 ENCOUNTER — Telehealth: Payer: Self-pay | Admitting: Pulmonary Disease

## 2016-02-16 NOTE — Telephone Encounter (Signed)
Pt's husband (dpr on file) requesting a refill on fentanyl patches and morphine solution.  I advised that BQ is out of the office until Thursday, and pt's husband states that pt has enough medication to last until the end of this week.  Pt wishes to pick up medication from office when it's ready.  Last refill on Fentanyl: 01/15/2016  Last refill on morphine solution: 01/15/2016  BQ ok to refill? Thanks!

## 2016-02-17 ENCOUNTER — Ambulatory Visit (INDEPENDENT_AMBULATORY_CARE_PROVIDER_SITE_OTHER): Payer: PPO | Admitting: Family Medicine

## 2016-02-17 ENCOUNTER — Encounter: Payer: Self-pay | Admitting: Family Medicine

## 2016-02-17 VITALS — BP 165/80 | HR 83 | Temp 98.8°F | Resp 16 | Ht <= 58 in | Wt 126.0 lb

## 2016-02-17 DIAGNOSIS — R0609 Other forms of dyspnea: Secondary | ICD-10-CM

## 2016-02-17 DIAGNOSIS — I10 Essential (primary) hypertension: Secondary | ICD-10-CM | POA: Diagnosis not present

## 2016-02-17 DIAGNOSIS — J84112 Idiopathic pulmonary fibrosis: Secondary | ICD-10-CM

## 2016-02-17 DIAGNOSIS — F329 Major depressive disorder, single episode, unspecified: Secondary | ICD-10-CM | POA: Diagnosis not present

## 2016-02-17 DIAGNOSIS — F419 Anxiety disorder, unspecified: Secondary | ICD-10-CM

## 2016-02-17 DIAGNOSIS — R5383 Other fatigue: Secondary | ICD-10-CM

## 2016-02-17 DIAGNOSIS — J449 Chronic obstructive pulmonary disease, unspecified: Secondary | ICD-10-CM | POA: Diagnosis not present

## 2016-02-17 DIAGNOSIS — R1310 Dysphagia, unspecified: Secondary | ICD-10-CM | POA: Diagnosis not present

## 2016-02-17 DIAGNOSIS — J9611 Chronic respiratory failure with hypoxia: Secondary | ICD-10-CM

## 2016-02-17 DIAGNOSIS — F32A Depression, unspecified: Secondary | ICD-10-CM

## 2016-02-17 DIAGNOSIS — R1319 Other dysphagia: Secondary | ICD-10-CM

## 2016-02-17 MED ORDER — AMITRIPTYLINE HCL 10 MG PO TABS: 10.0000 mg | ORAL_TABLET | Freq: Every day | ORAL | 6 refills | Status: DC

## 2016-02-17 NOTE — Progress Notes (Signed)
Name: Kathryn Ruiz   MRN: [id number]    DOB: [birth year]   Date:02/17/2016       Progress Note  Subjective  Chief Complaint  Chief Complaint  Patient presents with  . COPD  . Hypertension  . Anxiety    HPI Here c/o anxiety, inability to sleep and SOB.  Has terminal COPD and restrictive lung disease.  Sees Pulmonary.  Hs appt with Palliative Care tomorrow.  Not taking Elavil.  Took once and made too sensative.     No problem-specific Assessment & Plan notes found for this encounter.   Past Medical History:  Diagnosis Date  . CHF (congestive heart failure) ([place])   . COPD (chronic obstructive pulmonary disease) (HCC)    Dr. [facility] Bells [facility]  . Depression   . Esophageal stricture   . Fibromyalgia   . GERD (gastroesophageal reflux disease)   . H/O hiatal hernia   . Oxygen dependent    07-05-13 oxygen 24/7 -3 l/m daytime, 4 l/m nighttime nasally.    Past Surgical History:  Procedure Laterality Date  . APPENDECTOMY  1954  . CATARACT EXTRACTION  1998   both  . disectomy  2003  . ESOPHAGOGASTRODUODENOSCOPY (EGD) WITH PROPOFOL N/A 07/17/2013   Procedure: ESOPHAGOGASTRODUODENOSCOPY (EGD) WITH PROPOFOL;  Surgeon: Jerene Bears, MD;  Location: [facility];  Service: Gastroenterology;  Laterality: N/A;  . Foot irrigation  2002  . [place]  2001  . SHOULDER SURGERY Left 2010  . sinus surgery  1999  . VARICOSE VEIN SURGERY Bilateral   . vocal cord biopsy  2005    Family History  Problem Relation Age of Onset  . Heart attack Father 76  . Heart disease Mother     Social History   Social History  . Marital status: Married    Spouse name: N/A  . Number of children: 5  . Years of education: N/A   Occupational History  . Retired     Water engineer   Social History Main Topics  . Smoking status: Former Smoker    Packs/day: 3.00    Years: 50.00    Types: Cigarettes    Quit date: 01/26/1999  . Smokeless tobacco: Never Used  . Alcohol use No  . Drug use: No   . Sexual activity: No   Other Topics Concern  . Not on file   Social History Narrative  . No narrative on file     Current Outpatient Prescriptions:  .  amitriptyline (ELAVIL) 10 MG tablet, Take 1 tablet (10 mg total) by mouth at bedtime. Restart, Disp: 30 tablet, Rfl: 6 .  cloNIDine (CATAPRES-TTS-1) 0.1 mg/24hr patch, Place 1 patch (0.1 mg total) onto the skin once a week., Disp: 4 patch, Rfl: 12 .  COUGH SYRUP 100 MG/5ML syrup, TAKE 5 TO 10 MLS BY MOUTH EVERY FOUR HOURS AS NEEDED FOR COUGH, Disp: 237 mL, Rfl: 0 .  fentaNYL (DURAGESIC) 50 MCG/HR, Place 1 patch (50 mcg total) onto the skin every 3 (three) days. For pain and dyspnea, Disp: 60 patch, Rfl: 0 .  fluticasone (VERAMYST) 27.5 MCG/SPRAY nasal spray, Place 2 sprays into the nose daily as needed for rhinitis or allergies. , Disp: , Rfl:  .  guaiFENesin-codeine (CHERATUSSIN AC) 100-10 MG/5ML syrup, Take 5 mLs by mouth 3 (three) times daily as needed for cough., Disp: 120 mL, Rfl: 0 .  ipratropium-albuterol (DUONEB) 0.5-2.5 (3) MG/3ML SOLN, Take 3 mLs by nebulization every 4 (four) hours as needed., Disp: 360 mL, Rfl: 11 .  Melatonin 3 MG TABS, TAKE ONE TABLET BY MOUTH AT BEDTIME AS NEEDED FOR SLEEP, Disp: 30 each, Rfl: 6 .  metoCLOPramide (REGLAN) 5 MG/5ML solution, Take 5 mLs (5 mg total) by mouth 4 (four) times daily -  before meals and at bedtime., Disp: 120 mL, Rfl: 6 .  morphine (ROXANOL) 20 MG/ML concentrated solution, Take 20 mg by mouth every 2 (two) hours as needed for severe pain., Disp: , Rfl:  .  morphine 10 MG/5ML solution, Take by mouth every 2 (two) hours as needed for severe pain., Disp: , Rfl:  .  ondansetron (ZOFRAN) 4 MG tablet, Take 1 tablet (4 mg total) by mouth every 4 (four) hours as needed for nausea or vomiting., Disp: 30 tablet, Rfl: 11 .  pantoprazole (PROTONIX) 40 MG tablet, Take 40 mg by mouth daily., Disp: , Rfl:  .  polyethylene glycol powder (GLYCOLAX/MIRALAX) powder, Take 17 g by mouth 2 (two) times  daily as needed., Disp: 3350 g, Rfl: 1 .  SILTUSSIN DM ALCOHOL FREE 100-10 MG/5ML liquid, TAKE ONE TEASPOONFUL EVERY 4 HOURS AS NEEDED FOR COUGH, Disp: 237 mL, Rfl: 3 .  TUSSIN 100 MG/5ML liquid, TAKE 5 TO 10 MLS BY MOUTH EVERY FOUR HOURS AS NEEDED FOR COUGH, Disp: 237 mL, Rfl: 6  Not on File   Review of Systems  Constitutional: Positive for malaise/fatigue. Negative for chills, fever and weight loss.  HENT: Negative for congestion, hearing loss, sore throat and tinnitus.   Eyes: Negative for blurred vision and double vision.  Respiratory: Negative for cough, shortness of breath and wheezing.   Cardiovascular: Negative for chest pain, palpitations and leg swelling.  Gastrointestinal: Negative for abdominal pain, blood in stool and heartburn.  Genitourinary: Negative for dysuria, frequency and urgency.  Skin: Negative for rash.  Neurological: Positive for weakness. Negative for dizziness, tingling, tremors and headaches.  Psychiatric/Behavioral: Positive for depression. The patient is nervous/anxious and has insomnia.       Objective  Vitals:   02/17/16 1446 02/17/16 1535  BP: (!) 178/134 (!) 165/80  Pulse: 83   Resp: 16   Temp: 98.8 F (37.1 C)   TempSrc: Oral   SpO2: 97%   Weight: 126 lb (57.2 kg)   Height: 4\' 10"  (1.473 m)     Physical Exam  Constitutional: She is well-developed, well-nourished, and in no distress. No distress.  HENT:  Head: Normocephalic and atraumatic.  Eyes: Conjunctivae and EOM are normal. Pupils are equal, round, and reactive to light. No scleral icterus.  Neck: Normal range of motion. Neck supple. No thyromegaly present.  Cardiovascular: Normal rate and regular rhythm.  Exam reveals no gallop and no friction rub.   No murmur heard. Pulmonary/Chest: She has no wheezes. She has no rales.  Decreased breath sounds throughout.  Abdominal: Soft. Bowel sounds are normal. She exhibits no distension and no mass. There is no tenderness.  Musculoskeletal:  She exhibits no edema.  Lymphadenopathy:    She has no cervical adenopathy.  Neurological: She is alert.  Psychiatric:  Depressed and anxious affect.  Vitals reviewed.      No results found for this or any previous visit (from the past 2160 hour(s)).   Assessment & Plan  Problem List Items Addressed This Visit      Cardiovascular and Mediastinum   HBP (high blood pressure) - Primary     Respiratory   COPD (chronic obstructive pulmonary disease) (HCC)   Chronic hypoxemic respiratory failure (HCC)   IPF (idiopathic pulmonary fibrosis) ([place])  Digestive   Esophageal dysphagia     Other   Dyspnea on exertion   Fatigue due to depression   Acute anxiety   Relevant Medications   amitriptyline (ELAVIL) 10 MG tablet      Meds ordered this encounter  Medications  . amitriptyline (ELAVIL) 10 MG tablet    Sig: Take 1 tablet (10 mg total) by mouth at bedtime. Restart    Dispense:  30 tablet    Refill:  6   1. Essential hypertension   2. Chronic hypoxemic respiratory failure (HCC)   3. Chronic obstructive pulmonary disease, unspecified COPD type ([place])   4. IPF (idiopathic pulmonary fibrosis) ([place])   5. Esophageal dysphagia   6. Acute anxiety  - amitriptyline (ELAVIL) 10 MG tablet; Take 1 tablet (10 mg total) by mouth at bedtime. Restart  Dispense: 30 tablet; Refill: 6  7. Dyspnea on exertion   8. Fatigue due to depression

## 2016-02-18 ENCOUNTER — Telehealth: Payer: Self-pay | Admitting: *Deleted

## 2016-02-18 NOTE — Telephone Encounter (Signed)
Received letter as well as patient stating Lorazepam 0.5mg  has quantity of 150 per 30 day supply. Unless she can obtain an exception from ins.

## 2016-02-19 ENCOUNTER — Telehealth: Payer: Self-pay | Admitting: Family Medicine

## 2016-02-19 NOTE — Telephone Encounter (Signed)
Spoke with pt and made him aware that BQ has been on vacation, and will be back in [place] today to address this message. [name] states pt is currently out of patches, and her next patch will be Saturday.  I have made pt's spouse aware that we will contact him with BQ's response.  [name] voiced his understanding and had no further questions.

## 2016-02-19 NOTE — Telephone Encounter (Signed)
Pt wanted Dr. Luan Pulling know that she was exposed to croup.  Her call back number is [phone]

## 2016-02-19 NOTE — Telephone Encounter (Signed)
Pt husband returning call.Stanley A Dalton ° °

## 2016-02-20 ENCOUNTER — Telehealth: Payer: Self-pay | Admitting: Pulmonary Disease

## 2016-02-20 MED ORDER — FENTANYL 50 MCG/HR TD PT72: 50.0000 ug | MEDICATED_PATCH | TRANSDERMAL | 0 refills | Status: DC

## 2016-02-20 MED ORDER — MORPHINE SULFATE (CONCENTRATE) 20 MG/ML PO SOLN: 20.0000 mg | ORAL | 0 refills | Status: DC | PRN

## 2016-02-20 NOTE — Telephone Encounter (Signed)
Spoke with pt. Made her aware that we will have BQ address this matter this afternoon as soon as he gets here.

## 2016-02-20 NOTE — Telephone Encounter (Signed)
BQ please advise if we can refill these medications.

## 2016-02-20 NOTE — Telephone Encounter (Signed)
Spoke with pt's husband. He is aware that these prescriptions are ready for pick up. Rxs have been placed up front. Nothing further was needed.

## 2016-02-20 NOTE — Telephone Encounter (Signed)
Patient called to follow up on refill request on Fentanyl. Patient stated this urgent.

## 2016-02-20 NOTE — Telephone Encounter (Signed)
Dorian Pod with pallative care went out to see patient today. Patient does not qualify for hospice at this time, though there have been some changes.  Patient would benefit from home health order. If you have any questions she can be reached at [phone].

## 2016-02-20 NOTE — Telephone Encounter (Signed)
OK to refill

## 2016-02-23 NOTE — Telephone Encounter (Signed)
OK for home health order as requested.  Ask Palliative care if she doesn't qualify for some of their services even though she is not candidate for hospice.-jh

## 2016-02-23 NOTE — Telephone Encounter (Signed)
Home Health to be ordered through [facility].

## 2016-02-23 NOTE — Telephone Encounter (Signed)
Let me know if she starts coughing.-jh

## 2016-02-24 ENCOUNTER — Telehealth: Payer: Self-pay | Admitting: Family Medicine

## 2016-02-24 NOTE — Telephone Encounter (Signed)
R/t call to [facility] and let them know to proceed with referral. Patient is no longer under hospice.

## 2016-02-24 NOTE — Telephone Encounter (Signed)
Crystal with [facility] received a referral for pt and she is not sure if it should have been sent to her or if needed to go to Hospice.  Her call back number is [phone]

## 2016-02-27 DIAGNOSIS — H353122 Nonexudative age-related macular degeneration, left eye, intermediate dry stage: Secondary | ICD-10-CM | POA: Diagnosis not present

## 2016-03-01 DIAGNOSIS — H353221 Exudative age-related macular degeneration, left eye, with active choroidal neovascularization: Secondary | ICD-10-CM | POA: Diagnosis not present

## 2016-03-02 ENCOUNTER — Ambulatory Visit (INDEPENDENT_AMBULATORY_CARE_PROVIDER_SITE_OTHER): Payer: PPO | Admitting: Family Medicine

## 2016-03-02 ENCOUNTER — Encounter: Payer: Self-pay | Admitting: Family Medicine

## 2016-03-02 VITALS — BP 155/60 | HR 88 | Temp 97.9°F | Resp 16 | Ht <= 58 in | Wt 125.0 lb

## 2016-03-02 DIAGNOSIS — I10 Essential (primary) hypertension: Secondary | ICD-10-CM | POA: Diagnosis not present

## 2016-03-02 DIAGNOSIS — F419 Anxiety disorder, unspecified: Secondary | ICD-10-CM | POA: Diagnosis not present

## 2016-03-02 DIAGNOSIS — J449 Chronic obstructive pulmonary disease, unspecified: Secondary | ICD-10-CM | POA: Diagnosis not present

## 2016-03-02 DIAGNOSIS — J8410 Pulmonary fibrosis, unspecified: Secondary | ICD-10-CM

## 2016-03-02 DIAGNOSIS — R1310 Dysphagia, unspecified: Secondary | ICD-10-CM | POA: Diagnosis not present

## 2016-03-02 DIAGNOSIS — F321 Major depressive disorder, single episode, moderate: Secondary | ICD-10-CM

## 2016-03-02 DIAGNOSIS — N179 Acute kidney failure, unspecified: Secondary | ICD-10-CM

## 2016-03-02 DIAGNOSIS — R1319 Other dysphagia: Secondary | ICD-10-CM

## 2016-03-02 MED ORDER — AMITRIPTYLINE HCL 10 MG PO TABS: ORAL_TABLET | ORAL | 6 refills | Status: AC

## 2016-03-02 MED ORDER — LORAZEPAM 0.5 MG PO TABS: ORAL_TABLET | ORAL | 3 refills | Status: AC

## 2016-03-02 NOTE — Progress Notes (Signed)
Name: Kathryn Ruiz   MRN: [id number]    DOB: 1940-03-21   Date:03/02/2016       Progress Note  Subjective  Chief Complaint  Chief Complaint  Patient presents with  . Hypertension  . COPD  . Anxiety    HPI Here for f/u of HBP, terminal COPD and restrictive lung disease and anxiety.  She has restarted Ativan 0.5 mg three times a day for her severe anxiety.  She has phased out of Hospice at this time, but Home Care is being arranged. She has pulmonologist for her lung care. No problem-specific Assessment & Plan notes found for this encounter.   Past Medical History:  Diagnosis Date  . CHF (congestive heart failure) ([place])   . COPD (chronic obstructive pulmonary disease) (HCC)    Dr. [facility]  . Depression   . Esophageal stricture   . Fibromyalgia   . GERD (gastroesophageal reflux disease)   . H/O hiatal hernia   . Oxygen dependent    07-05-13 oxygen 24/7 -3 l/m daytime, 4 l/m nighttime nasally.    Past Surgical History:  Procedure Laterality Date  . APPENDECTOMY  1954  . CATARACT EXTRACTION  1998   both  . disectomy  2003  . ESOPHAGOGASTRODUODENOSCOPY (EGD) WITH PROPOFOL N/A 07/17/2013   Procedure: ESOPHAGOGASTRODUODENOSCOPY (EGD) WITH PROPOFOL;  Surgeon: Jerene Bears, MD;  Location: [facility];  Service: Gastroenterology;  Laterality: N/A;  . Foot irrigation  2002  . [place]  2001  . SHOULDER SURGERY Left 2010  . sinus surgery  1999  . VARICOSE VEIN SURGERY Bilateral   . vocal cord biopsy  2005    Family History  Problem Relation Age of Onset  . Heart attack Father 2  . Heart disease Mother     Social History   Social History  . Marital status: Married    Spouse name: N/A  . Number of children: 5  . Years of education: N/A   Occupational History  . Retired     Water engineer   Social History Main Topics  . Smoking status: Former Smoker    Packs/day: 3.00    Years: 50.00    Types: Cigarettes    Quit date: 01/26/1999  .  Smokeless tobacco: Never Used  . Alcohol use No  . Drug use: No  . Sexual activity: No   Other Topics Concern  . Not on file   Social History Narrative  . No narrative on file     Current Outpatient Prescriptions:  .  amitriptyline (ELAVIL) 10 MG tablet, Take 2 tabs each night as directed., Disp: 60 tablet, Rfl: 6 .  cloNIDine (CATAPRES-TTS-1) 0.1 mg/24hr patch, Place 1 patch (0.1 mg total) onto the skin once a week., Disp: 4 patch, Rfl: 12 .  COUGH SYRUP 100 MG/5ML syrup, TAKE 5 TO 10 MLS BY MOUTH EVERY FOUR HOURS AS NEEDED FOR COUGH, Disp: 237 mL, Rfl: 0 .  fentaNYL (DURAGESIC) 50 MCG/HR, Place 1 patch (50 mcg total) onto the skin every 3 (three) days. For pain and dyspnea, Disp: 10 patch, Rfl: 0 .  fluticasone (VERAMYST) 27.5 MCG/SPRAY nasal spray, Place 2 sprays into the nose daily as needed for rhinitis or allergies. , Disp: , Rfl:  .  guaiFENesin-codeine (CHERATUSSIN AC) 100-10 MG/5ML syrup, Take 5 mLs by mouth 3 (three) times daily as needed for cough., Disp: 120 mL, Rfl: 0 .  ipratropium-albuterol (DUONEB) 0.5-2.5 (3) MG/3ML SOLN, Take 3 mLs by nebulization every 4 (four) hours as  needed., Disp: 360 mL, Rfl: 11 .  Melatonin 3 MG TABS, TAKE ONE TABLET BY MOUTH AT BEDTIME AS NEEDED FOR SLEEP, Disp: 30 each, Rfl: 6 .  metoCLOPramide (REGLAN) 5 MG/5ML solution, Take 5 mLs (5 mg total) by mouth 4 (four) times daily -  before meals and at bedtime., Disp: 120 mL, Rfl: 6 .  morphine (ROXANOL) 20 MG/ML concentrated solution, Take 1 mL (20 mg total) by mouth every 2 (two) hours as needed for severe pain., Disp: 120 mL, Rfl: 0 .  morphine 10 MG/5ML solution, Take by mouth every 2 (two) hours as needed for severe pain., Disp: , Rfl:  .  ondansetron (ZOFRAN) 4 MG tablet, Take 1 tablet (4 mg total) by mouth every 4 (four) hours as needed for nausea or vomiting., Disp: 30 tablet, Rfl: 11 .  pantoprazole (PROTONIX) 40 MG tablet, Take 40 mg by mouth daily., Disp: , Rfl:  .  polyethylene glycol  powder (GLYCOLAX/MIRALAX) powder, Take 17 g by mouth 2 (two) times daily as needed., Disp: 3350 g, Rfl: 1 .  SILTUSSIN DM ALCOHOL FREE 100-10 MG/5ML liquid, TAKE ONE TEASPOONFUL EVERY 4 HOURS AS NEEDED FOR COUGH, Disp: 237 mL, Rfl: 3 .  TUSSIN 100 MG/5ML liquid, TAKE 5 TO 10 MLS BY MOUTH EVERY FOUR HOURS AS NEEDED FOR COUGH, Disp: 237 mL, Rfl: 6 .  LORazepam (ATIVAN) 0.5 MG tablet, Take 1 tablet three times a day for anxiety, Disp: 90 tablet, Rfl: 3  No Known Allergies   Review of Systems  Constitutional: Negative for chills, fever, malaise/fatigue and weight loss.  HENT: Negative for hearing loss and tinnitus.   Eyes: Positive for blurred vision. Negative for double vision.       Has macular degeneration with macular bleed.  Seeing Ophth  Respiratory: Positive for cough, shortness of breath and wheezing.   Cardiovascular: Negative for chest pain, palpitations and leg swelling.  Gastrointestinal: Negative for abdominal pain, blood in stool and heartburn.  Genitourinary: Negative for dysuria, frequency and urgency.  Skin: Negative for rash.  Neurological: Negative for dizziness, tingling, tremors, weakness and headaches.      Objective  Vitals:   03/02/16 0924 03/02/16 0955  BP: (!) 162/57 (!) 155/60  Pulse: 88   Resp: 16   Temp: 97.9 F (36.6 C)   TempSrc: Oral   Weight: 125 lb (56.7 kg)   Height: 4\' 10"  (1.473 m)     Physical Exam  Constitutional: She is oriented to person, place, and time and well-developed, well-nourished, and in no distress. No distress.  HENT:  Head: Normocephalic and atraumatic.  Neck: Normal range of motion. Neck supple. Carotid bruit is not present. No thyromegaly present.  Cardiovascular: Normal rate, regular rhythm and normal heart sounds.  Exam reveals no gallop and no friction rub.   No murmur heard. Pulmonary/Chest: No respiratory distress. She has no wheezes. She has no rales.  O2 via nasal canula at 5L/min.  No distress at rest.  Decreased  breath sounds throughout.  Musculoskeletal: She exhibits no edema.  Lymphadenopathy:    She has no cervical adenopathy.  Neurological: She is alert and oriented to person, place, and time.  Vitals reviewed.      No results found for this or any previous visit (from the past 2160 hour(s)).   Assessment & Plan  Problem List Items Addressed This Visit      Cardiovascular and Mediastinum   HBP (high blood pressure) - Primary     Respiratory   COPD (  chronic obstructive pulmonary disease) (HCC)   Pulmonary fibrosis (HCC)     Digestive   Esophageal dysphagia     Genitourinary   AKI (acute kidney injury) ([place])     Other   Acute anxiety   Relevant Medications   amitriptyline (ELAVIL) 10 MG tablet   LORazepam (ATIVAN) 0.5 MG tablet   Depression, major, single episode, moderate (HCC)   Relevant Medications   amitriptyline (ELAVIL) 10 MG tablet   LORazepam (ATIVAN) 0.5 MG tablet      Meds ordered this encounter  Medications  . amitriptyline (ELAVIL) 10 MG tablet    Sig: Take 2 tabs each night as directed.    Dispense:  60 tablet    Refill:  6  . LORazepam (ATIVAN) 0.5 MG tablet    Sig: Take 1 tablet three times a day for anxiety    Dispense:  90 tablet    Refill:  3    1. Essential hypertension   2. Pulmonary fibrosis ([place])   3. Chronic obstructive pulmonary disease, unspecified COPD type ([place])   4. Esophageal dysphagia   5. AKI (acute kidney injury) ([place])   6. Acute anxiety  - amitriptyline (ELAVIL) 10 MG tablet; Take 2 tabs each night as directed.  Dispense: 60 tablet; Refill: 6 - LORazepam (ATIVAN) 0.5 MG tablet; Take 1 tablet three times a day for anxiety  Dispense: 90 tablet; Refill: 3  7. Depression, major, single episode, moderate (HCC)  - amitriptyline (ELAVIL) 10 MG tablet; Take 2 tabs each night as directed.  Dispense: 60 tablet; Refill: 6  Continue to take all other meds and follow Pulmonary recommendations.  Home health pending

## 2016-03-08 DIAGNOSIS — H353221 Exudative age-related macular degeneration, left eye, with active choroidal neovascularization: Secondary | ICD-10-CM | POA: Diagnosis not present

## 2016-03-16 ENCOUNTER — Encounter: Payer: Self-pay | Admitting: Pulmonary Disease

## 2016-03-16 ENCOUNTER — Ambulatory Visit (INDEPENDENT_AMBULATORY_CARE_PROVIDER_SITE_OTHER): Payer: PPO | Admitting: Pulmonary Disease

## 2016-03-16 VITALS — BP 146/64 | HR 97 | Ht <= 58 in | Wt 129.0 lb

## 2016-03-16 DIAGNOSIS — J8410 Pulmonary fibrosis, unspecified: Secondary | ICD-10-CM | POA: Diagnosis not present

## 2016-03-16 DIAGNOSIS — J449 Chronic obstructive pulmonary disease, unspecified: Secondary | ICD-10-CM

## 2016-03-16 DIAGNOSIS — J9611 Chronic respiratory failure with hypoxia: Secondary | ICD-10-CM | POA: Diagnosis not present

## 2016-03-16 DIAGNOSIS — R1310 Dysphagia, unspecified: Secondary | ICD-10-CM

## 2016-03-16 MED ORDER — FENTANYL 50 MCG/HR TD PT72: 50.0000 ug | MEDICATED_PATCH | TRANSDERMAL | 0 refills | Status: AC

## 2016-03-16 MED ORDER — UMECLIDINIUM-VILANTEROL 62.5-25 MCG/INH IN AEPB: 1.0000 | INHALATION_SPRAY | Freq: Every day | RESPIRATORY_TRACT | 0 refills | Status: AC

## 2016-03-16 MED ORDER — MORPHINE SULFATE (CONCENTRATE) 20 MG/ML PO SOLN: 20.0000 mg | ORAL | 0 refills | Status: AC | PRN

## 2016-03-16 NOTE — Progress Notes (Signed)
Subjective:    Patient ID: Kathryn Ruiz, female    DOB: 1940-12-14, 76 y.o.   MRN: [id number]  Synopsis: Kathryn Ruiz was first followed by Dr. Lamonte Sakai in the winter of 2013 the [place] office and then came to the [facility] office in June 2013 for evaluation of COPD and pulmonary fibrosis. She smoked 3 packs a day for 50 years and quit in 2001. CT chest has shown nonspecific fibrotic changes in the bases as well as a significant amount of emphysema throughout her lungs. She has a history of esophageal dysphagia requiring balloon dilation on multiple times in the past. By 2015 her shortness of breath had progressed with a decreased total lung capacity to get the nonspecific fibrotic changes on her CT had not changed. She was started on Ofev in 2015 but was unable to tolerated do to significant nausea and vomiting. She was evaluated by the [facility] lung transplant clinic and was told that she was not a good candidate due to her esophageal dysmotility.  She has an esophageal stricture, but GI as of 2016 says she can't have another endoscopy due to lung disease.   HPI Chief Complaint  Patient presents with  . Follow-up    pt is not doing well through palliative care- pt is requesting home health services.  Pt also interested in transferring care to [facility] d/t travel.     Kathryn Ruiz has been having a hard time lately. 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   Her energy level is poor.  She has increased fatigue and severe dyspnea with any exertion. Her husband says things have gotten to the point where he doesn't feel comfortable leaving her at home anymore and taking trips.   They would like to see palliative care as an outpatient.  They had a lot of rapport with Dr. Domingo Cocking.    She has been using her fentanyl patches as prescribed.   She continues to use oxygen at 5L PM continuously.    She coughs a lot and uses her nebulizer.    She only uses albuterol as needed for dyspnea, she doesn't take any  other inhalers.  It sounds like this was stopped due to dry mouth when she was on hospice.    Her eating has improved, but only because she can take pureed food.  They are actually buying baby.    She takes morphine 3-5 times per day (0.82mL to 0.7 mL) per dose, sometimes this helps, other times no help.  Past Medical History:  Diagnosis Date  . CHF (congestive heart failure) ([place])   . COPD (chronic obstructive pulmonary disease) (HCC)    [facility]  . Depression   . Esophageal stricture   . Fibromyalgia   . GERD (gastroesophageal reflux disease)   . H/O hiatal hernia   . Oxygen dependent    07-05-13 oxygen 24/7 -3 l/m daytime, 4 l/m nighttime nasally.     Review of Systems  Constitutional: Negative for chills, fever and unexpected weight change.  HENT: Negative for congestion, nosebleeds, postnasal drip, rhinorrhea and sneezing.   Respiratory: Positive for shortness of breath. Negative for cough and choking.   Cardiovascular: Negative for chest pain and leg swelling.  Gastrointestinal: Positive for constipation.       Objective:   Physical Exam  Vitals:   03/16/16 1143  BP: (!) 146/64  BP Location: Left Arm  Cuff Size: Normal  Pulse: 97  SpO2: 92%  Weight:  129 lb (58.5 kg)  Height: 4\' 10"  (1.473 m)   4 L nasal cannula  Gen: chronically ill appearing HENT: OP clear, TM's clear, neck supple PULM: Crackles bases B, normal percussion CV: RRR, no mgr, trace edema GI: BS+, soft, nontender Derm: no cyanosis or rash Psyche: normal mood and affect   150 pack year smoking history, quit 2001 05/2011 PFT [facility] >> Ratio 61%, FEV1 1.43 L 91% pred TLC 4.05 L 106% pred DLCO 41% pred 06/2011 Overnight oximetry >> 96% of the time her O2 saturation was between 80 and 90% (94% under 88%) 06/2011 CT Chest [facility]>> marked emphysema and basilar fibrosis  08/2011 started on 3L O2 with exertion 08/2011 6MW >> 750 ft., O2 saturation 79% on exercise;  09/2011 [facility] 3 11/2011  Pulm rehab [facility] 01/2012 6MW 1010 ft, O2 sat 90% nadir 3L 05/2012 IgE > normal 05/2012 sputum AFB >> 05/2012 sputum fungal >> c. Albicans 07/2012 CT chest >> bilateral severe emphysema; question of honeycombing and interstitial thickening in the bases, not clearly UIP: [name] read> no significant progression since the 06/2011 study 12/2012 PFT> ratio 65%, FEV1 1.29 L (85% pred), no change with BD, TLC 3.52L (93% pred), DLCO 6.2 (36% pred) 12/2012 ILD serology panel negative\ February 2015 barium swallow> mild narrowing of the lower cervical esophagus which results in transient obstruction of the 12 mm barium pill, high grade narrowing of the distal esophagus just proximal to a moderate sized partially reducible hiatal hernia. Maximal measured luminal diameter is 4 mm. 12 mm barium pill would not pass. 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 02/2013 Echo> Normal LVEF, mild LVH and diastolic dysfunction; normal RV and RVSP 03/2013 rejected for consideration for lung transplant [facility] (dysphagia) January 2016 6 minute walk> 299 feet, could only walk 3 minutes, O2 saturation try to 84% on 3 L nasal cannula December 2015 full pulmonary function test> ratio 64%, FEV1 1.22 L (82% predicted), total lung capacity 3.10 L (82% predicted), DLCO 4.7 (28% predicted). October 2016 full pulmonary function testing ratio 68%, FEV1 1.46 L (86% predicted), FVC 2.14 L (94% predicted), DLCO 7.85 (44% predicted).      Assessment & Plan:   Chronic hypoxemic respiratory failure (HCC) Continue 5 L nasal cannula continuously  COPD (chronic obstructive pulmonary disease) ([place]) She has severe emphysema despite relatively well preserved airflow obstruction. She's treating her symptom of dyspnea with narcotics and only as needed albuterol with ipratropium. I've given her a sample of Anoro to take once daily as we need to get her back on a regular long-acting bronchodilator.  Dysphagia Continue pured diet  Pulmonary fibrosis  ([place]) She was treated empirically for UIP with anti-fibrotic agents, but she had such severe side effects we ultimately stop them. Today on exam she's got crackles in the bases which are worse compared when I saw her last.  Clearly, Mechele Claude has end-stage symptoms which come both from severe emphysema as well as interstitial lung disease. She needs to be on hospice program. 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 These do provide some benefit to her dyspnea but also contribute to some of her forgetfulness and other symptoms.  Today we taken measures to try to treat her underlying disease (emphysema and I'll be) by adding long acting bronchodilators for her emphysema. Continue fentanyl patch and morphine for dyspnea relief.  We will make  efforts to get her enrolled in a transitional hospice program which can maintain patient's for 2-3 years.  Greater than 50% of time in a 25 minute visit spent face-to-face today.  Updated Medication List Outpatient Encounter Prescriptions as of 03/16/2016  Medication Sig Dispense Refill  . amitriptyline (ELAVIL) 10 MG tablet Take 2 tabs each night as directed. 60 tablet 6  . cloNIDine (CATAPRES-TTS-1) 0.1 mg/24hr patch Place 1 patch (0.1 mg total) onto the skin once a week. 4 patch 12  . COUGH SYRUP 100 MG/5ML syrup TAKE 5 TO 10 MLS BY MOUTH EVERY FOUR HOURS AS NEEDED FOR COUGH 237 mL 0  . fentaNYL (DURAGESIC) 50 MCG/HR Place 1 patch (50 mcg total) onto the skin every 3 (three) days. For pain and dyspnea 10 patch 0  . fluticasone (VERAMYST) 27.5 MCG/SPRAY nasal spray Place 2 sprays into the nose daily as needed for rhinitis or allergies.     Marland Kitchen guaiFENesin-codeine (CHERATUSSIN AC) 100-10 MG/5ML syrup Take 5 mLs by mouth 3 (three) times daily as needed for cough. 120 mL 0  . ipratropium-albuterol (DUONEB) 0.5-2.5  (3) MG/3ML SOLN Take 3 mLs by nebulization every 4 (four) hours as needed. 360 mL 11  . LORazepam (ATIVAN) 0.5 MG tablet Take 1 tablet three times a day for anxiety 90 tablet 3  . Melatonin 3 MG TABS TAKE ONE TABLET BY MOUTH AT BEDTIME AS NEEDED FOR SLEEP 30 each 6  . metoCLOPramide (REGLAN) 5 MG/5ML solution Take 5 mLs (5 mg total) by mouth 4 (four) times daily -  before meals and at bedtime. 120 mL 6  . morphine (ROXANOL) 20 MG/ML concentrated solution Take 1 mL (20 mg total) by mouth every 2 (two) hours as needed for severe pain. 120 mL 0  . ondansetron (ZOFRAN) 4 MG tablet Take 1 tablet (4 mg total) by mouth every 4 (four) hours as needed for nausea or vomiting. 30 tablet 11  . pantoprazole (PROTONIX) 40 MG tablet Take 40 mg by mouth daily.    . polyethylene glycol powder (GLYCOLAX/MIRALAX) powder Take 17 g by mouth 2 (two) times daily as needed. 3350 g 1  . SILTUSSIN DM ALCOHOL FREE 100-10 MG/5ML liquid TAKE ONE TEASPOONFUL EVERY 4 HOURS AS NEEDED FOR COUGH 237 mL 3  . TUSSIN 100 MG/5ML liquid TAKE 5 TO 10 MLS BY MOUTH EVERY FOUR HOURS AS NEEDED FOR COUGH 237 mL 6  . [DISCONTINUED] fentaNYL (DURAGESIC) 50 MCG/HR Place 1 patch (50 mcg total) onto the skin every 3 (three) days. For pain and dyspnea 10 patch 0  . [DISCONTINUED] morphine (ROXANOL) 20 MG/ML concentrated solution Take 1 mL (20 mg total) by mouth every 2 (two) hours as needed for severe pain. 120 mL 0  . [DISCONTINUED] morphine 10 MG/5ML solution Take by mouth every 2 (two) hours as needed for severe pain.    Marland Kitchen umeclidinium-vilanterol (ANORO ELLIPTA) 62.5-25 MCG/INH AEPB Inhale 1 puff into the lungs daily. 1 each 0   No facility-administered encounter medications on file as of 03/16/2016.

## 2016-03-16 NOTE — Assessment & Plan Note (Signed)
Continue pured diet

## 2016-03-16 NOTE — Assessment & Plan Note (Signed)
Continue 5 L nasal cannula continuously

## 2016-03-16 NOTE — Assessment & Plan Note (Signed)
She was treated empirically for UIP with anti-fibrotic agents, but she had such severe side effects we ultimately stop them. Today on exam she's got crackles in the bases which are worse compared when I saw her last.  Clearly, Kathryn Ruiz has end-stage symptoms which come both from severe emphysema as well as interstitial lung disease. She needs to be on hospice program. 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 These do provide some benefit to her dyspnea but also contribute to some of her forgetfulness and other symptoms.  Today we taken measures to try to treat her underlying disease (emphysema and I'll be) by adding long acting bronchodilators for her emphysema. Continue fentanyl patch and morphine for dyspnea relief.  We will make efforts to get her enrolled in a transitional hospice program which can maintain patient's for 2-3 years.

## 2016-03-16 NOTE — Patient Instructions (Signed)
We will refer you to a transitional hospice program Keep taking your medicines as you are doing Take Anoro 1 puff daily, call us if this is helping with your breathing and we will send in a prescription We will transition you to the [facility] clinic, follow-up there in 4 weeks

## 2016-03-16 NOTE — Assessment & Plan Note (Signed)
She has severe emphysema despite relatively well preserved airflow obstruction. She's treating her symptom of dyspnea with narcotics and only as needed albuterol with ipratropium. I've given her a sample of Anoro to take once daily as we need to get her back on a regular long-acting bronchodilator.

## 2016-03-17 ENCOUNTER — Telehealth: Payer: Self-pay | Admitting: Pulmonary Disease

## 2016-03-17 NOTE — Telephone Encounter (Signed)
Spoke with Kathryn Ruiz and she stated on where the physician is suppose to sign BQ name was printed. Asked if a new form needed to be filled out or could a sig just be above or below would be sufficient she stated it would. Got the form from [place] and the form has been re-faxed to [phone] with BQ's sig. Kathryn Ruiz is aware that we will be sending the fax nothing further is needed at this time.

## 2016-03-17 NOTE — Telephone Encounter (Signed)
Attempted to call [place]. There was no answer and I could not leave a message. Will try back.

## 2016-03-17 NOTE — Telephone Encounter (Signed)
Hospice returning call and can be reached @ [phone].Hillery Hunter

## 2016-03-18 ENCOUNTER — Telehealth: Payer: Self-pay | Admitting: Pulmonary Disease

## 2016-03-18 DIAGNOSIS — K219 Gastro-esophageal reflux disease without esophagitis: Secondary | ICD-10-CM | POA: Diagnosis not present

## 2016-03-18 DIAGNOSIS — J449 Chronic obstructive pulmonary disease, unspecified: Secondary | ICD-10-CM | POA: Diagnosis not present

## 2016-03-18 DIAGNOSIS — I110 Hypertensive heart disease with heart failure: Secondary | ICD-10-CM | POA: Diagnosis not present

## 2016-03-18 DIAGNOSIS — Z9981 Dependence on supplemental oxygen: Secondary | ICD-10-CM | POA: Diagnosis not present

## 2016-03-18 DIAGNOSIS — F329 Major depressive disorder, single episode, unspecified: Secondary | ICD-10-CM | POA: Diagnosis not present

## 2016-03-18 DIAGNOSIS — F419 Anxiety disorder, unspecified: Secondary | ICD-10-CM | POA: Diagnosis not present

## 2016-03-18 DIAGNOSIS — J9611 Chronic respiratory failure with hypoxia: Secondary | ICD-10-CM

## 2016-03-18 DIAGNOSIS — M797 Fibromyalgia: Secondary | ICD-10-CM | POA: Diagnosis not present

## 2016-03-18 DIAGNOSIS — I5032 Chronic diastolic (congestive) heart failure: Secondary | ICD-10-CM | POA: Diagnosis not present

## 2016-03-18 DIAGNOSIS — J8410 Pulmonary fibrosis, unspecified: Secondary | ICD-10-CM

## 2016-03-18 NOTE — Telephone Encounter (Signed)
X1 for Denies at hospice

## 2016-03-19 NOTE — Telephone Encounter (Signed)
Order has been placed for the pt to be set up with palliative care.  Nothing further is needed.

## 2016-03-19 NOTE — Telephone Encounter (Signed)
?   Even transitional hospice? Please make palliative care referral

## 2016-03-19 NOTE — Telephone Encounter (Signed)
Kathryn Ruiz with hospice. [name] wanted to give BQ an FYI - that pt does not qualify for hospice care just palliative care. The hospice NP will continue to monitor pt to evaluate if in the future she will qualify for hospice care.   Will send to BQ as FYI.

## 2016-03-19 NOTE — Telephone Encounter (Signed)
[name] returning call - she can be reached at [phone] -pr

## 2016-03-19 NOTE — Addendum Note (Signed)
Addended by: Elie Confer on: 03/19/2016 02:32 PM   Modules accepted: Orders

## 2016-03-24 ENCOUNTER — Ambulatory Visit (INDEPENDENT_AMBULATORY_CARE_PROVIDER_SITE_OTHER): Payer: PPO | Admitting: Family Medicine

## 2016-03-24 ENCOUNTER — Ambulatory Visit
Admission: RE | Admit: 2016-03-24 | Discharge: 2016-03-24 | Disposition: A | Discharge status: Home / Self Care | Payer: PPO | Source: Ambulatory Visit | Attending: Family Medicine | Admitting: Family Medicine

## 2016-03-24 ENCOUNTER — Encounter: Payer: Self-pay | Admitting: Family Medicine

## 2016-03-24 VITALS — BP 174/62 | HR 82 | Temp 98.4°F | Resp 14 | Ht <= 58 in

## 2016-03-24 DIAGNOSIS — F329 Major depressive disorder, single episode, unspecified: Secondary | ICD-10-CM | POA: Diagnosis not present

## 2016-03-24 DIAGNOSIS — R6889 Other general symptoms and signs: Secondary | ICD-10-CM | POA: Diagnosis not present

## 2016-03-24 DIAGNOSIS — J9611 Chronic respiratory failure with hypoxia: Secondary | ICD-10-CM | POA: Diagnosis not present

## 2016-03-24 DIAGNOSIS — I5032 Chronic diastolic (congestive) heart failure: Secondary | ICD-10-CM | POA: Diagnosis not present

## 2016-03-24 DIAGNOSIS — J441 Chronic obstructive pulmonary disease with (acute) exacerbation: Secondary | ICD-10-CM

## 2016-03-24 DIAGNOSIS — I700 Atherosclerosis of aorta: Secondary | ICD-10-CM | POA: Diagnosis not present

## 2016-03-24 DIAGNOSIS — J449 Chronic obstructive pulmonary disease, unspecified: Secondary | ICD-10-CM

## 2016-03-24 DIAGNOSIS — F419 Anxiety disorder, unspecified: Secondary | ICD-10-CM | POA: Diagnosis not present

## 2016-03-24 DIAGNOSIS — K219 Gastro-esophageal reflux disease without esophagitis: Secondary | ICD-10-CM | POA: Diagnosis not present

## 2016-03-24 DIAGNOSIS — I110 Hypertensive heart disease with heart failure: Secondary | ICD-10-CM | POA: Diagnosis not present

## 2016-03-24 DIAGNOSIS — R05 Cough: Secondary | ICD-10-CM | POA: Diagnosis not present

## 2016-03-24 DIAGNOSIS — Z9981 Dependence on supplemental oxygen: Secondary | ICD-10-CM | POA: Diagnosis not present

## 2016-03-24 DIAGNOSIS — M797 Fibromyalgia: Secondary | ICD-10-CM | POA: Diagnosis not present

## 2016-03-24 LAB — POCT INFLUENZA A/B
INFLUENZA B, POC: NEGATIVE
Influenza A, POC: NEGATIVE

## 2016-03-24 MED ORDER — BENZONATATE 100 MG PO CAPS: 100.0000 mg | ORAL_CAPSULE | Freq: Three times a day (TID) | ORAL | 0 refills | Status: AC | PRN

## 2016-03-24 MED ORDER — PREDNISONE 50 MG PO TABS: 50.0000 mg | ORAL_TABLET | Freq: Every day | ORAL | 0 refills | Status: DC

## 2016-03-24 MED ORDER — LEVOFLOXACIN 25 MG/ML PO SOLN: 500.0000 mg | Freq: Every day | ORAL | 0 refills | Status: DC

## 2016-03-24 NOTE — Assessment & Plan Note (Addendum)
Consistent with acute exacerbation of COPD with worsening productive cough x 5 days in setting of URI / PNA symptoms. - 92% on 5L O2 continuous (unchanged home settings, without worsening hypoxia with known chronic respiratory failure) - Currently afebrile, no recent hospitalization, no recent antibiotics/prednisone - No clinical evidence of volume overload, does have elevated BP though  Plan: 1. Check CXR 2v in office today - reviewed results with patient, COPD with acute superimposed interstitial pneumonia identified, without focal consolidation or alveolar pneumonia 2. Start Prednisone 50mg  x 5 day steroid burst 3. Start Levaquin 500mg  daily x 7 days for COPD / PNA coverage (solution due to swallowing difficulty, 20 mL daily #140 mL) - for both prednisone and levaquin advised if improving but not resolving, can notify office sooner within 1 week to extend these courses if needed until can see pulmonology 4. Continue regular Duoneb treatments, maintenance inhaler, already existing Tussin syrup, guaifenesin 5. Add Tessalon Perls take 1 capsule up to 3 times a day as needed for cough 6. Follow-up 1-2 weeks if not improving, reviewed criteria when to go to ED vs f/u with Pulmonology, already scheduled to see Dr Cherlynn Polo [facility] office 04/29/16, however they were trying to move up to 4 weeks from last visit as advised

## 2016-03-24 NOTE — Progress Notes (Signed)
Subjective:    Patient ID: Kathryn Ruiz, female    DOB: 01-15-1941, 76 y.o.   MRN: [id number]  Kathryn Ruiz is a 77 y.o. female presenting on 03/24/2016 for Shortness of Breath (congestion onset 3 days but weakness is onset 5 days)  Patient presents for a same day appointment.  HPI   ACUTE COPD EXAC / END STAGE COPD / Pulmonary Fibrosis / Chronic Hypoxemic Resp Failure on O2 - Patient is currently followed by [facility] for symptom management of end stage COPD and respiratory failure, on supplemental O2 at 5L continuous 24 hr, recent history discharged from hospice care within past 2 months. - Reports symptoms started about 5 days ago, initially with generalized weakness and muscle aches all over, then worsening cough and congestion, describes productive cough, has been mostly bed bound without any exertional symptoms. - Last breathing treatment duoneb (last 30 min prior to office visit). No recent antibiotics or steroids. - Using chronic pulm / palliative meds - Anora inhaler, Tussin cough syrup, Guaifenesin, Ativan PRN - No known sick contacts or known flu exposure. S/p flu vaccine this season. - Admits constipation, reduced appetite - Denies any fevers, chills sweats, nausea, vomiting, diarrhea, headache  Social History  Substance Use Topics  . Smoking status: Former Smoker    Packs/day: 3.00    Years: 50.00    Types: Cigarettes    Quit date: 01/26/1999  . Smokeless tobacco: Never Used  . Alcohol use No    Review of Systems Per HPI unless specifically indicated above     Objective:    BP (!) 174/62   Pulse 82   Temp 98.4 F (36.9 C) (Oral)   Resp 14   Ht 4\' 10"  (1.473 m)   SpO2 92%   Wt Readings from Last 3 Encounters:  03/16/16 129 lb (58.5 kg)  03/02/16 125 lb (56.7 kg)  02/17/16 126 lb (57.2 kg)    Physical Exam  Constitutional: She is oriented to person, place, and time. She appears well-developed and well-nourished. No  distress.  Chronically ill and acutely ill-appearing, tired, mildly uncomfortable due to cough, cooperative, wheelchair bound  HENT:  Head: Normocephalic and atraumatic.  Mouth/Throat: Oropharynx is clear and moist.  Nares patent with some congestion without purulence. Oropharynx clear without erythema, exudates, edema or asymmetry.  On O2 5L nasal cannula  Eyes: Conjunctivae are normal. Right eye exhibits no discharge. Left eye exhibits no discharge.  Neck: Normal range of motion. Neck supple.  Cardiovascular: Normal rate, regular rhythm, normal heart sounds and intact distal pulses.   No murmur heard. Pulmonary/Chest:  Diffusely reduced air movement, with coarse breath sounds and some extended expiratory wheezes diffusely. No focal crackles. Some mild increased work of breathing but conversational.  Musculoskeletal: Normal range of motion. She exhibits no edema.  Lymphadenopathy:    She has no cervical adenopathy.  Neurological: She is alert and oriented to person, place, and time.  Skin: Skin is warm and dry. No rash noted. She is not diaphoretic.  Psychiatric: Her behavior is normal.  Nursing note and vitals reviewed.  I have personally reviewed the radiology report from Chest X-ray 03/24/16  CLINICAL DATA:  Acute worsening of dyspnea, occasional productive cough over the past 5 days. History of COPD with chronic respiratory failure on home oxygen. Former smoker.  EXAM: CHEST  2 VIEW  COMPARISON:  PA and lateral chest x-ray of September 27, 2013  FINDINGS: The lungs remain hyperinflated. The  interstitial markings are more conspicuous diffusely. There is no alveolar infiltrate. The heart and pulmonary vascularity are normal. There is calcification in the wall of the aortic arch. There is no pleural effusion. There is multilevel degenerative disc disease of the thoracic spine.  IMPRESSION: COPD. No alveolar pneumonia. Acutely increased interstitial markings likely  reflects an superimposed acute interstitial pneumonia. No CHF or pleural effusion.  Thoracic aortic atherosclerosis.   Electronically Signed   By: David  Martinique M.D.   On: 03/24/2016 16:39   I have personally reviewed the following lab results from 03/24/16.  Results for orders placed or performed in visit on 03/24/16  POCT Influenza A/B  Result Value Ref Range   Influenza A, POC Negative Negative   Influenza B, POC Negative Negative      Assessment & Plan:   Problem List Items Addressed This Visit    COPD exacerbation ([place]) - Primary    Consistent with acute exacerbation of COPD with worsening productive cough x 5 days in setting of URI / PNA symptoms. - 92% on 5L O2 continuous (unchanged home settings, without worsening hypoxia with known chronic respiratory failure) - Currently afebrile, no recent hospitalization, no recent antibiotics/prednisone - No clinical evidence of volume overload, does have elevated BP though  Plan: 1. Check CXR 2v in office today - reviewed results with patient, COPD with acute superimposed interstitial pneumonia identified, without focal consolidation or alveolar pneumonia 2. Start Prednisone 50mg  x 5 day steroid burst 3. Start Levaquin 500mg  daily x 7 days for COPD / PNA coverage (solution due to swallowing difficulty, 20 mL daily #140 mL) - for both prednisone and levaquin advised if improving but not resolving, can notify office sooner within 1 week to extend these courses if needed until can see pulmonology 4. Continue regular Duoneb treatments, maintenance inhaler, already existing Tussin syrup, guaifenesin 5. Add Tessalon Perls take 1 capsule up to 3 times a day as needed for cough 6. Follow-up 1-2 weeks if not improving, reviewed criteria when to go to ED vs f/u with Pulmonology, already scheduled to see Dr Cherlynn Polo pulm [place] office 04/29/16, however they were trying to move up to 4 weeks from last visit as advised       Relevant Medications   benzonatate (TESSALON) 100 MG capsule   levofloxacin (LEVAQUIN) 25 MG/ML solution   predniSONE (DELTASONE) 50 MG tablet   Other Relevant Orders   DG Chest 2 View (Completed)   COPD (chronic obstructive pulmonary disease) (HCC)   Relevant Medications   benzonatate (TESSALON) 100 MG capsule   predniSONE (DELTASONE) 50 MG tablet   Other Relevant Orders   DG Chest 2 View (Completed)   Chronic hypoxemic respiratory failure (HCC)    Currently unchanged supplemental O2 settings with 5L continuous with pulse ox 92% (reported to be consistent with home readings, ranging 92-94%), now with acute COPD with possible superimposed CAP - See A&P, treat underlying AECOPD / PNA - Follow-up with Pulm as planned      Relevant Orders   DG Chest 2 View (Completed)    Other Visit Diagnoses    Flu-like symptoms   Rapid flu NEGATIVE today. Given afebrile, will not cover for Flu with Tamiflu, after discussion of treatment for COPD and identified likely pneumonia on CXR with antibiotic treatment.      Relevant Orders   POCT Influenza A/B (Completed)      Meds ordered this encounter  Medications  . benzonatate (TESSALON) 100 MG capsule    Sig:  Take 1 capsule (100 mg total) by mouth 3 (three) times daily as needed for cough.    Dispense:  30 capsule    Refill:  0  . levofloxacin (LEVAQUIN) 25 MG/ML solution    Sig: Take 20 mLs (500 mg total) by mouth daily. For 7 days    Dispense:  140 mL    Refill:  0  . predniSONE (DELTASONE) 50 MG tablet    Sig: Take 1 tablet (50 mg total) by mouth daily with breakfast.    Dispense:  5 tablet    Refill:  0      Follow up plan: Return in about 2 weeks (around 04/07/2016), or if symptoms worsen or fail to improve, for COPD.  Nobie Putnam, [facility] 03/24/2016, 5:05 PM

## 2016-03-24 NOTE — Patient Instructions (Signed)
Thank you for coming in to clinic today.  1. It sounds like you had an Upper Respiratory Virus that has settled into a Bronchitis, lower respiratory tract infection. I don't have concerns for pneumonia today, and think that this should gradually improve. Once you are feeling better, the cough may take a few weeks to fully resolve. I do hear wheezing and coarse breath sounds likely due to COPD - we will check chest x-ray for evaluating if any pneumonia but you are covered for this as well - Start Levaquin liquid antibiotic 500mg  daily (28mL) per dose for 7 days, if not improving can extend both either antibiotic and or prednisone - Start Prednisone 50mg  daily for next 5 days - this will open up lungs allow you to breath better and treat that wheezing or bronchospasm - Continue Duoneb every 4-6 hours, and other maintenance inhalers as well as cough syrups - Start Tessalon Perls take 1 capsule up to 3 times a day as needed for cough  If your symptoms seem to worsen instead of improve over next several days, including significant fever / chills, worsening shortness of breath, worsening wheezing, or nausea / vomiting and can't take medicines - return sooner or go to hospital Emergency Department for more immediate treatment.  * REMEMBER TO CALL PULMONOLOGY [place] OFFICE TO CHECK IF THEY CAN SEE HER SOONER OR IF 04/29/16 is the correct appointment, at 10:45am*  Please schedule a follow-up appointment with Dr. Parks Ranger in 1-2 weeks if not improved COPD  If you have any other questions or concerns, please feel free to call the clinic or send a message through [place]. You may also schedule an earlier appointment if necessary.  Nobie Putnam, DO [place]

## 2016-03-24 NOTE — Assessment & Plan Note (Addendum)
Currently unchanged supplemental O2 settings with 5L continuous with pulse ox 92% (reported to be consistent with home readings, ranging 92-94%), now with acute COPD with possible superimposed CAP - See A&P, treat underlying AECOPD / PNA - Follow-up with Pulm as planned

## 2016-03-30 DIAGNOSIS — I110 Hypertensive heart disease with heart failure: Secondary | ICD-10-CM | POA: Diagnosis not present

## 2016-03-30 DIAGNOSIS — M797 Fibromyalgia: Secondary | ICD-10-CM | POA: Diagnosis not present

## 2016-03-30 DIAGNOSIS — F419 Anxiety disorder, unspecified: Secondary | ICD-10-CM | POA: Diagnosis not present

## 2016-03-30 DIAGNOSIS — I5032 Chronic diastolic (congestive) heart failure: Secondary | ICD-10-CM | POA: Diagnosis not present

## 2016-03-30 DIAGNOSIS — F329 Major depressive disorder, single episode, unspecified: Secondary | ICD-10-CM | POA: Diagnosis not present

## 2016-03-30 DIAGNOSIS — K219 Gastro-esophageal reflux disease without esophagitis: Secondary | ICD-10-CM | POA: Diagnosis not present

## 2016-03-30 DIAGNOSIS — Z9981 Dependence on supplemental oxygen: Secondary | ICD-10-CM | POA: Diagnosis not present

## 2016-03-30 DIAGNOSIS — J449 Chronic obstructive pulmonary disease, unspecified: Secondary | ICD-10-CM | POA: Diagnosis not present

## 2016-04-12 ENCOUNTER — Ambulatory Visit
Admission: RE | Admit: 2016-04-12 | Discharge: 2016-04-12 | Disposition: A | Discharge status: Home / Self Care | Payer: PPO | Source: Ambulatory Visit | Attending: Family Medicine | Admitting: Family Medicine

## 2016-04-12 ENCOUNTER — Ambulatory Visit (INDEPENDENT_AMBULATORY_CARE_PROVIDER_SITE_OTHER): Payer: PPO | Admitting: Family Medicine

## 2016-04-12 ENCOUNTER — Encounter: Payer: Self-pay | Admitting: Family Medicine

## 2016-04-12 VITALS — BP 132/57 | HR 95 | Temp 98.5°F | Resp 14 | Ht <= 58 in | Wt 134.0 lb

## 2016-04-12 DIAGNOSIS — J432 Centrilobular emphysema: Secondary | ICD-10-CM

## 2016-04-12 DIAGNOSIS — M545 Low back pain, unspecified: Secondary | ICD-10-CM

## 2016-04-12 DIAGNOSIS — R079 Chest pain, unspecified: Secondary | ICD-10-CM | POA: Diagnosis not present

## 2016-04-12 DIAGNOSIS — M25522 Pain in left elbow: Secondary | ICD-10-CM

## 2016-04-12 DIAGNOSIS — M4316 Spondylolisthesis, lumbar region: Secondary | ICD-10-CM | POA: Diagnosis not present

## 2016-04-12 DIAGNOSIS — I517 Cardiomegaly: Secondary | ICD-10-CM | POA: Insufficient documentation

## 2016-04-12 DIAGNOSIS — M19022 Primary osteoarthritis, left elbow: Secondary | ICD-10-CM | POA: Insufficient documentation

## 2016-04-12 DIAGNOSIS — W19XXXA Unspecified fall, initial encounter: Secondary | ICD-10-CM | POA: Diagnosis not present

## 2016-04-12 DIAGNOSIS — S299XXA Unspecified injury of thorax, initial encounter: Secondary | ICD-10-CM | POA: Diagnosis not present

## 2016-04-12 DIAGNOSIS — J029 Acute pharyngitis, unspecified: Secondary | ICD-10-CM

## 2016-04-12 DIAGNOSIS — R682 Dry mouth, unspecified: Secondary | ICD-10-CM | POA: Diagnosis not present

## 2016-04-12 DIAGNOSIS — R918 Other nonspecific abnormal finding of lung field: Secondary | ICD-10-CM | POA: Insufficient documentation

## 2016-04-12 DIAGNOSIS — S59902A Unspecified injury of left elbow, initial encounter: Secondary | ICD-10-CM | POA: Diagnosis not present

## 2016-04-12 DIAGNOSIS — M5136 Other intervertebral disc degeneration, lumbar region: Secondary | ICD-10-CM | POA: Insufficient documentation

## 2016-04-12 LAB — POCT RAPID STREP A (OFFICE): RAPID STREP A SCREEN: NEGATIVE

## 2016-04-12 NOTE — Progress Notes (Signed)
Subjective:    Patient ID: Kathryn Ruiz, female    DOB: 08-18-1940, 76 y.o.   MRN: [id number]  Kathryn Ruiz is a 76 y.o. female presenting on 04/12/2016 for Sore Throat (onset saturday had cough) and Fall (pt fell Saturday and has bruises on her Left arm and back pain was getting worst after fall)   HPI   SORE THROAT / DRY ORAL MUCSOA / END STAGE COPD / Pulm Fibrosis / Chronic Hypoxemic Resp Failure on O2 PMH - Complex patient with history of end stage COPD and chronic resp failure, followed by [facility]. Previously on hospice care and discharged in 2018. - Last seen by me on 03/24/16 for Acute COPD exacerbation with respiratory illness. Diagnosed with CXR without identified alveolar pneumonia but some superimposed acute interstitial pneumonia, treated with Levaquin 500mg  daily for 7 days and Prednisone 50mg  daily for 5 days. She has not returned to Pulmonology yet in interval - Today nearly 3 weeks later returns with complaint of worsening sore throat. Her recent COPD / URI / PNA symptoms had not completely resolved since last visit, she did get better but not back to baseline. Now sore throat worse over past 2 days, also describes thick productive sputum, very stringy and difficult to produce. Dry oral mucosa, on continuous oxygen, she uses water to oxygen and humidifier in her room, but still complains of dry mouth. - Next Pulmonology visit in 2 weeks early 04/2016   - Admits chronic dyspnea, wheezing, on continuous 24 hour O2 5L now without worsening acute dyspnea or cough - Denies any fevers/chills, nausea, vomiting, chest pain  FALL, Initial Encounter / LEFT ELBOW and LOW BACK PAIN: - Reports recent fall at home 2 days ago, states husband was not present in room to witness, she accidentally tripped over her oxygen cord and fell from standing position hit her left elbow and arm and on her back / tailbone. Admits to some increased pain initially some  localized bruising and swelling, since improving. Now has residual bruising on Left arm - Admits some dizziness when leaning forward - Denies loss of consciousness or syncope   Social History  Substance Use Topics  . Smoking status: Former Smoker    Packs/day: 3.00    Years: 50.00    Types: Cigarettes    Quit date: 01/26/1999  . Smokeless tobacco: Never Used  . Alcohol use No    Review of Systems Per HPI unless specifically indicated above     Objective:    BP (!) 132/57   Pulse 95   Temp 98.5 F (36.9 C) (Oral)   Resp 14   Ht 4\' 10"  (1.473 m)   Wt 134 lb (60.8 kg)   SpO2 90%   BMI 28.01 kg/m   Wt Readings from Last 3 Encounters:  04/12/16 134 lb (60.8 kg)  03/16/16 129 lb (58.5 kg)  03/02/16 125 lb (56.7 kg)    Physical Exam  Constitutional: She is oriented to person, place, and time. She appears well-developed and well-nourished. No distress.  Chronically ill and still mildly ill-appearing, tired but improved cough now, cooperative, wheelchair bound  HENT:  Head: Normocephalic and atraumatic.  Nares patent still with some congestion without purulence. Oropharynx significant generalized dryness of tongue and oral muscoa, poster pharynx, some generalized erythema, non focal, no exudates, no edema or asymmetry.  On O2 5L nasal cannula  Eyes: Conjunctivae are normal. Right eye exhibits no discharge. Left eye exhibits no  discharge.  Neck: Normal range of motion. Neck supple.  Cardiovascular: Normal rate, regular rhythm, normal heart sounds and intact distal pulses.   No murmur heard. Pulmonary/Chest: Effort normal. She has no wheezes.  Improved air movement today. Mostly resolved cough today.  Still some baseline mild to moderately reduced air movement, improved fewer coarse breath sounds now without any exp wheezing. No focal crackles.  Conversational, speaks full sentences. Still some baseline increased work of breathing, overall improved.  Musculoskeletal: Normal  range of motion.  Low Back Inspection: Normal appearance, no spinal deformity, symmetrical. Palpation: No tenderness over spinous processes. Mild tenderness bilateral paraspinal lower lumbar sacral region, minimal ecchymosis. Some lower lumbar muscle spasm. ROM: Some baseline limited ROM flexion / ext Special Testing: Seated SLR negative for radicular pain bilaterally Strength: Bilateral hip flex/ext 5/5, knee flex/ext 5/5, ankle dorsiflex/plantarflex 5/5 Neurovascular: intact distal sensation to light touch  Left Upper Extremity / Elbow Inspection: Mild healing ecchymosis yellowish inferior to elbow dorsal aspect, without significant edema Palpation: mild tenderness over proximal radius near elbow joint, and over olecranon ROM: full active, some discomfort but elbow flex/ext, shoulder flex/ext normal Strength: 5/5 grip, wrist / elbow flex/ext Neurovascular: distally intact  Lymphadenopathy:    She has no cervical adenopathy.  Neurological: She is alert and oriented to person, place, and time.  Skin: Skin is warm and dry. No rash noted. She is not diaphoretic.  Psychiatric: Her behavior is normal.  Nursing note and vitals reviewed.  Imaging 04/12/16 - Chest X-ray 2 view - pending - Left Elbow - pending - Lumbar Spine - pending  Results for orders placed or performed in visit on 04/12/16  POCT rapid strep A  Result Value Ref Range   Rapid Strep A Screen Negative Negative      Assessment & Plan:   Problem List Items Addressed This Visit    Oral dryness    Concern significant cause of her sore throat, likely secondary to thick sputum/phlegm with supplemental O2. - No evidence of acute pharyngitis or infection - Rapid strep negative  Plan: 1. Reviewed symptomatic management - improve hydration, lozenges, humidifier 2. Recommend Fish Oil supplement to help oral dryness, improve secretions 3. Try Mucinex to thin out secretions 4. Follow-up with Pulm for additional advice given  high dose supplemental O2, additional may follow-up with Palliative Care for additional symptom management       COPD (chronic obstructive pulmonary disease) ([place]) - Primary    Interval improvement over 3 weeks from last AECOPD, now without acute wheezing, less coughing and improved air movement. Near baseline O2 sat on 5L continuous O2. Still has persistent very thick sputum, some productive cough, worsening dryness of oral mucosa / sore throat. - Followed by Pulmonology  Plan: 1. No acute change to treatments today - will check Chest x-ray in interval 3 weeks now after last AECOPD / interstitial pneumonia identified on CXR. If still present or worsening, will consider alternative antibiotic coverage, consider Augmentin vs Doxycycline vs Azithro 2. No repeat prednisone given no AECOPD today 3. May continue Mucinex generic twice daily for now as expectorant with thick sputum 4. Follow-up as planned with Pulmonology in 2 weeks      Relevant Orders   DG Chest 2 View    Other Visit Diagnoses    Fall, initial encounter      S/p accidental mechanical fall at home by report, without LOC or other red flags symptomatically that contributed. Suspect minor trauma with fall from standing on left  arm/elbow and low back, bruising is resolving. No acute bony tenderness today, range of motion is intact. - Check Left elbow X-ray and Lumbar x-ray to rule out any potential fracture, after discussion with patient - No changes to management, continue regular analgesia per Pulm/Palliative at this time - Will follow-up with X-ray results    Acute bilateral low back pain without sciatica       Relevant Orders   DG Lumbar Spine Complete   Left elbow pain       Relevant Orders   DG Elbow Complete Left   Sore throat     - Secondary to oral dryness, see A&P    Relevant Orders   POCT rapid strep A (Completed)      No orders of the defined types were placed in this encounter.     Follow up  plan: Return in about 2 weeks (around 04/26/2016), or if symptoms worsen or fail to improve.  Nobie Putnam, [facility] 04/12/2016, 1:19 PM

## 2016-04-12 NOTE — Assessment & Plan Note (Signed)
Concern significant cause of her sore throat, likely secondary to thick sputum/phlegm with supplemental O2. - No evidence of acute pharyngitis or infection - Rapid strep negative  Plan: 1. Reviewed symptomatic management - improve hydration, lozenges, humidifier 2. Recommend Fish Oil supplement to help oral dryness, improve secretions 3. Try Mucinex to thin out secretions 4. Follow-up with Pulm for additional advice given high dose supplemental O2, additional may follow-up with Palliative Care for additional symptom management

## 2016-04-12 NOTE — Assessment & Plan Note (Signed)
Interval improvement over 3 weeks from last AECOPD, now without acute wheezing, less coughing and improved air movement. Near baseline O2 sat on 5L continuous O2. Still has persistent very thick sputum, some productive cough, worsening dryness of oral mucosa / sore throat. - Followed by Pulmonology  Plan: 1. No acute change to treatments today - will check Chest x-ray in interval 3 weeks now after last AECOPD / interstitial pneumonia identified on CXR. If still present or worsening, will consider alternative antibiotic coverage, consider Augmentin vs Doxycycline vs Azithro 2. No repeat prednisone given no AECOPD today 3. May continue Mucinex generic twice daily for now as expectorant with thick sputum 4. Follow-up as planned with Pulmonology in 2 weeks

## 2016-04-12 NOTE — Patient Instructions (Signed)
Thank you for coming in to clinic today.  1. You do not have any wheezing or significant abnormal breath sounds today. - I think that the COPD / Pneumonia flare has resolved, on the previous Antibiotic and Prednisone medicine, however you still have the thicker sputum and this combined with oxygen are keeping your mouth very dry, making it sore and irritated - We will check Chest X-ray today as repeat, if there is still pneumonia evidence or other concern we can do one more round of antibiotics - Otherwise for thicker sputum / phlegm, try Mucinex (generic without additives) twice daily for next 1-2 weeks to help thin this out. - Also for dry mouth, one possible treatment is Fish Oil Supplement, this can help natural oils to lubricate mouth  2. For your fall, we can check X-rays today to evaluate for any fractures, elbow and low back - We will call you with results later today or tomorrow - Most likely bruising will continue to heal, and may just need regular medicine for pain control  If we are still not making progress with breathing, dry mouth, thick sputum/phlegm, then recommend that you contact your Pulmonology / Lung Doctor next to discuss further.  Please schedule a follow-up appointment with Dr. Parks Ranger in 1-3 months as needed  If you have any other questions or concerns, please feel free to call the clinic or send a message through [place]. You may also schedule an earlier appointment if necessary.  Nobie Putnam, DO [place]

## 2016-04-13 DIAGNOSIS — H353221 Exudative age-related macular degeneration, left eye, with active choroidal neovascularization: Secondary | ICD-10-CM | POA: Diagnosis not present

## 2016-04-15 ENCOUNTER — Telehealth: Payer: Self-pay | Admitting: *Deleted

## 2016-04-15 DIAGNOSIS — J441 Chronic obstructive pulmonary disease with (acute) exacerbation: Secondary | ICD-10-CM

## 2016-04-15 MED ORDER — DOXYCYCLINE HYCLATE 100 MG PO TABS: 100.0000 mg | ORAL_TABLET | Freq: Two times a day (BID) | ORAL | 0 refills | Status: DC

## 2016-04-15 MED ORDER — PREDNISONE 20 MG PO TABS: ORAL_TABLET | ORAL | 0 refills | Status: DC

## 2016-04-15 NOTE — Telephone Encounter (Signed)
Palliative nurse Ivin Booty called to report patient update: Very confused state Low grade fever 99.4 Worsening cough Not able to ambulate on her own.  They want to know is you would like to prescribe another round of abx and or prednisone.  Also, a request to order Hospice patient has qualified for full hospice.

## 2016-04-15 NOTE — Telephone Encounter (Signed)
Agree with trial on repeat course for possible worsening Acute COPD. Sent rx to [facility] for repeat antibiotics and prednisone, sent Doxycycline 100mg  BID for 10 days, and Prednisone taper over 7 days. She should start taking today. However, if acutely ill may need more emergent therapy at hospital ED (if this is within patient wishes and care plan) or other symptomatic intervention per hospice.  Agree with request to proceed with Hospice order. Will complete initial paperwork tomorrow when back in office, 04/15/16.  Spoke to [place], [facility] regarding this update. She will notify patient/family about the new antibiotic and prednisone sent to pharmacy, and advise them to seek more immediate care at hospital if acute worsening.  Nobie Putnam, [facility] 04/15/2016, 2:27 PM

## 2016-04-29 ENCOUNTER — Institutional Professional Consult (permissible substitution): Payer: PPO | Admitting: Pulmonary Disease

## 2016-05-11 DIAGNOSIS — H353221 Exudative age-related macular degeneration, left eye, with active choroidal neovascularization: Secondary | ICD-10-CM | POA: Diagnosis not present

## 2016-05-19 ENCOUNTER — Encounter: Payer: Self-pay | Admitting: Family Medicine

## 2016-05-19 NOTE — Progress Notes (Signed)
FMLA Form Completion: Received FMLA paperwork for family member (Daughter, Colan Neptune) of patient Kathryn Ruiz) for renewal of request for FMLA.  Company: [employer]  Part A: Background Information: 1. Approximate date of onset for condition requesting leave - June 2013 2. Probable duration of patient's condition: Terminal - lifelong, progressive decline 3. Dates you have treated patient: 03/24/16, 04/12/16  Part B: Serious Health Condition: 1. Inpatient Care - No 2. Pregnancy - No 3. Incapacity - Yes        Yes - may require monthly office visit                Prescribed medication for condition other than OTC? - Yes        Referred to other health care providers for eval / treatment? - Yes (Dr Simonne Maffucci [facility]), Hospice)      - Approximate dates of incapacity 04/15/16 - 09/25/16, approx 6 months on hospice  Amount and Type of Leave Needed May need continuous leave. Can have intermittent flares       - Estimated Frequency - #1 flare approx 2 times per week, duration 8 hours vs 1 day  Additional Information: - Applicants mother (patient) has terminal progressive worsening End Stage COPD, pulmonary fibrosis, chronic pain fibromyalgia, R eye blindness, needs increasing assistance at home with ADLs, meal preparation, medication assistance, transportation to doctors visits, and other help with daily care.  Completed, signed, and dated FMLA paperwork 05/19/16. To be scanned into chart and submitted via fax.  Nobie Putnam, [facility] 05/19/2016, 5:16 PM

## 2016-05-27 ENCOUNTER — Encounter: Payer: Self-pay | Admitting: Family Medicine

## 2016-05-27 ENCOUNTER — Ambulatory Visit (INDEPENDENT_AMBULATORY_CARE_PROVIDER_SITE_OTHER): Payer: PPO | Admitting: Family Medicine

## 2016-05-27 VITALS — BP 144/45 | HR 89 | Temp 98.6°F | Resp 16 | Ht <= 58 in | Wt 132.0 lb

## 2016-05-27 DIAGNOSIS — I83893 Varicose veins of bilateral lower extremities with other complications: Secondary | ICD-10-CM | POA: Diagnosis not present

## 2016-05-27 DIAGNOSIS — Z515 Encounter for palliative care: Secondary | ICD-10-CM

## 2016-05-27 DIAGNOSIS — L84 Corns and callosities: Secondary | ICD-10-CM | POA: Diagnosis not present

## 2016-05-27 DIAGNOSIS — M25571 Pain in right ankle and joints of right foot: Secondary | ICD-10-CM

## 2016-05-27 MED ORDER — MUPIROCIN CALCIUM 2 % EX CREA: 1.0000 "application " | TOPICAL_CREAM | Freq: Two times a day (BID) | CUTANEOUS | 1 refills | Status: AC

## 2016-05-27 NOTE — Assessment & Plan Note (Addendum)
Suspected etiology of bilateral lower extremity pain given extensive varicosity and spider veins, has intermittent edema, limited mobility as well. - No evidence of joint problem today, no significant joint or ligamentous tenderness, ROM and strength intact, no injury or mechanism for MSK injury  Plan: 1. RICE therapy reviewed, add ice packs and improve elevation 2. Continue topical muscle rub creams to provide relief 3. Follow-up as needed, can continue symptomatic control through hospice

## 2016-05-27 NOTE — Assessment & Plan Note (Signed)
Multiple callus formation on bilateral feet, medial great toes, area of concern is R great toe bunion area with chronic callus and now recent 1 week blister formation with some clear fluid drainage and bleeding, some interval healing without obvious open ulceration or infection. Darkening is concerning for potential dry gangrene at first but it is soft not fibrotic and clinically not appearing to be infected or without blood flow  Plan: 1. Reassurance, continue monitoring, rx Mupriocin BID for 1-2 weeks to prevent secondary infection if opens up, will take time for callus and blister to heal. Avoid direct pressure, use slippers and offload if needed. Improve elevation, continue symptom control through hospice

## 2016-05-27 NOTE — Progress Notes (Signed)
Subjective:    Patient ID: Kathryn Ruiz, female    DOB: [birth year], 76 y.o.   MRN: [id number]  Wyatt Galvan is a 76 y.o. female presenting on 05/27/2016 for Toe Pain (Right side toe nail black and Left side no changing color but painful)  Patient provides most of history also accompanied by spouse, Angelena Sole, who provides additional history.  HPI   Right Foot / Ankle Pain / Pre-Ulcerative Callus - Reports concern with Right ankle pain for past 1 month, some pain in L ankle as well, medial aspect, has varicose veins. Endorses some intermittent swelling, not elevating legs. Pain is at rest or with activity but not only with ambulation or weight bearing. - Also has R bunion with thicker callus formation and discoloration "dark" appearance to the skin started about 1 week ago without significant resolution. Did not have any new injury, has had callus for months, on side of both great toes, thought due to shoes and chronic wear pattern. This spot drained some clear fluid previously, thought was blister - Denies any erythema, drainage of pus, fevers, chills, sweats  She is a hospice patient, managed primarily by hospice attending physician.   Social History  Substance Use Topics  . Smoking status: Former Smoker    Packs/day: 3.00    Years: 50.00    Types: Cigarettes    Quit date: 01/26/1999  . Smokeless tobacco: Never Used  . Alcohol use No    Review of Systems Per HPI unless specifically indicated above     Objective:    BP (!) 144/45   Pulse 89   Temp 98.6 F (37 C) (Oral)   Resp 16   Ht 4\' 10"  (1.473 m)   Wt 132 lb (59.9 kg)   SpO2 (!) 87%   BMI 27.59 kg/m   Wt Readings from Last 3 Encounters:  05/27/16 132 lb (59.9 kg)  04/12/16 134 lb (60.8 kg)  03/16/16 129 lb (58.5 kg)    Physical Exam  Constitutional: She appears well-developed and well-nourished. No distress.  Chronically ill-appearing 76 yr female, comfortable, cooperative, in wheelchair, has  supplemental oxygen  HENT:  Mouth/Throat: Oropharynx is clear and moist.  Eyes: Conjunctivae are normal.  Cardiovascular: Normal rate.   Pulmonary/Chest: Effort normal.  Musculoskeletal: She exhibits no edema.  Foot / Ankle Inspection: Bilateral feet with callus formation, R>L medial aspect of great toe and bunion. Also significant varicose veins bilateral, increased at ankles and upper leg. Palpation: Mild tenderness over medial ankles R>L not necessarily malleolus without deformity. ROM: Full active ankle flex/dorisflex Special Testing: Talar tilt testing normal Strength: Intact ankle Neurovascular: Distally intact  Skin: Skin is warm and dry. No rash noted. She is not diaphoretic. No erythema.  R foot, medial aspect of R great toe bunion with callus and some discoloration soft palpable area mild tender feels blister with callus, not firm fibrotic tissue. No open ulceration, induration, erythema or drainage, see picture.  Nursing note and vitals reviewed.    R foot      Results for orders placed or performed in visit on 04/12/16  POCT rapid strep A  Result Value Ref Range   Rapid Strep A Screen Negative Negative      Assessment & Plan:   Problem List Items Addressed This Visit    Symptomatic varicose veins, bilateral    Suspected etiology of bilateral lower extremity pain given extensive varicosity and spider veins, has intermittent edema, limited mobility as well. -  No evidence of joint problem today, no significant joint or ligamentous tenderness, ROM and strength intact, no injury or mechanism for MSK injury  Plan: 1. RICE therapy reviewed, add ice packs and improve elevation 2. Continue topical muscle rub creams to provide relief 3. Follow-up as needed, can continue symptomatic control through hospice      Pre-ulcerative calluses    Multiple callus formation on bilateral feet, medial great toes, area of concern is R great toe bunion area with chronic callus and now  recent 1 week blister formation with some clear fluid drainage and bleeding, some interval healing without obvious open ulceration or infection. Darkening is concerning for potential dry gangrene at first but it is soft not fibrotic and clinically not appearing to be infected or without blood flow  Plan: 1. Reassurance, continue monitoring, rx Mupriocin BID for 1-2 weeks to prevent secondary infection if opens up, will take time for callus and blister to heal. Avoid direct pressure, use slippers and offload if needed. Improve elevation, continue symptom control through hospice      Relevant Medications   mupirocin cream (BACTROBAN) 2 %   Hospice care patient    Other Visit Diagnoses    Acute right ankle pain    -  Primary   See A&P symptomatic varicose veins.      Meds ordered this encounter  Medications  . mupirocin cream (BACTROBAN) 2 %    Sig: Apply 1 application topically 2 (two) times daily. For 1-2 weeks as needed for R foot    Dispense:  15 g    Refill:  1      Follow up plan: Return in about 5 days (around 06/01/2016).  Nobie Putnam, [facility] 05/27/2016, 2:32 PM

## 2016-05-27 NOTE — Patient Instructions (Signed)
Thank you for coming to the clinic today.  1. I think the ankle pain is from the varicose veins and some venous insufficiency.  Use RICE therapy: - R - Rest / relative rest with activity modification - I - Ice packs (make sure you use a towel or sock / something to protect skin) - C - Compression with pressure stockings or ACE wrap to apply pressure and reduce swelling allowing more support - E - Elevation - if significant swelling, lift leg above heart level (toes above your nose) to help reduce swelling, most helpful at night after day of being on your feet  Try the ice packs to help.  May continue topical foot rub as needed.  Ankle is normal appearing and  I don't see any abnormality with the bone or skin.  2. For the callus on R foot, most likely some early ulceration and bleeding under skin of the callus, this is going to take some time to heal, try the antibiotic cream twice daily on this one for 1-2 weeks, to avoid and prevent infection. - Rarely this can worsen to have some more dead tissue and can cause an infection - IF you get worsening pain, redness, swelling, fevers/chills this may have turned into a skin infection  Try to avoid friction and pressure on these calluses  Already scheduled for follow-up  If you have any other questions or concerns, please feel free to call the clinic or send a message through [place]. You may also schedule an earlier appointment if necessary.  Nobie Putnam, DO [place]

## 2016-06-01 ENCOUNTER — Ambulatory Visit (INDEPENDENT_AMBULATORY_CARE_PROVIDER_SITE_OTHER): Payer: PPO | Admitting: Family Medicine

## 2016-06-01 ENCOUNTER — Encounter: Payer: Self-pay | Admitting: Family Medicine

## 2016-06-01 VITALS — BP 164/70 | HR 86 | Temp 98.2°F | Resp 16 | Ht <= 58 in | Wt 133.0 lb

## 2016-06-01 DIAGNOSIS — L84 Corns and callosities: Secondary | ICD-10-CM | POA: Diagnosis not present

## 2016-06-01 DIAGNOSIS — Z515 Encounter for palliative care: Secondary | ICD-10-CM | POA: Diagnosis not present

## 2016-06-01 NOTE — Progress Notes (Signed)
Subjective:    Patient ID: Kathryn Ruiz, female    DOB: [birth year], 76 y.o.   MRN: [id number]  Kathryn Ruiz is a 76 y.o. female presenting on 06/01/2016 for Follow-up  Patient provides most of history also accompanied by spouse, Kathryn Ruiz, who provides additional history.  Patient is currently on [place], followed by the hospice attending physician for primary management.  HPI   FOLLOW-UP Pre-Ulcerative Callus - Last seen by me 05/27/16, initial visit for bilateral lower extremity pain with varicose veins and R foot callus with scab formation. Given Mupriocin BID for R foot pre-ulcerative callus. - Today returns for revaluation of R medial great toe area callus, now with significant healing and natural sloughing off of thick callus scab, about 60-70% off of foot now, still attached with some tenderness, not fully healed underneath. Continues to use Mupirocin twice daily, keeping covered and avoiding exacerbation. Using some epsom salt soaks - Also concern other callus on bottom of R foot with pale appearance may be sloughing off superficial dead skin - Denies any erythema, drainage of pus, fevers, chills, sweats  Additionally prior history of thrush and some lip/mouth sores, since resolved, s/p night time soaking teeth in bleach water, and given anti fungal medicine, cleared oral thrush and oral   Social History  Substance Use Topics  . Smoking status: Former Smoker    Packs/day: 3.00    Years: 50.00    Types: Cigarettes    Quit date: 01/26/1999  . Smokeless tobacco: Never Used  . Alcohol use No    Review of Systems Per HPI unless specifically indicated above     Objective:    BP (!) 164/70   Pulse 86   Temp 98.2 F (36.8 C) (Oral)   Resp 16   Ht 4\' 10"  (1.473 m)   Wt 133 lb (60.3 kg)   SpO2 (!) 89%   BMI 27.80 kg/m   Wt Readings from Last 3 Encounters:  06/01/16 133 lb (60.3 kg)  05/27/16 132 lb (59.9 kg)  04/12/16 134 lb (60.8 kg)    Physical Exam    Constitutional: She appears well-developed and well-nourished. No distress.  Chronically ill-appearing 76 yr female, comfortable, cooperative, in wheelchair, has supplemental oxygen  HENT:  Mouth/Throat: Oropharynx is clear and moist.  Eyes: Conjunctivae are normal.  Cardiovascular: Normal rate.   Pulmonary/Chest: Effort normal.  Musculoskeletal: She exhibits no edema.  Foot / Ankle Inspection: Bilateral feet with callus formation, R>L medial aspect of great toe and bunion. Also significant varicose veins bilateral, increased at ankles and upper leg. Palpation: Mild tenderness over medial ankles R>L not necessarily malleolus without deformity. ROM: Full active ankle flex/dorisflex Special Testing: Talar tilt testing normal Strength: Intact ankle Neurovascular: Distally intact  Skin: Skin is warm and dry. No rash noted. She is not diaphoretic. No erythema.  R foot, medial aspect of R great toe bunion with callus and some discoloration soft palpable area mild tender feels blister with callus, not firm fibrotic tissue. No open ulceration, induration, erythema or drainage, see picture.  Nursing note and vitals reviewed.    R foot OLD IMAGE last week 05/27/16    R Foot, new image taken today 06/01/16, healing callus/scab    R Foot, plantar, new image today 06/01/16, lateral thicker callus formation        Assessment & Plan:   Problem List Items Addressed This Visit    Pre-ulcerative calluses - Primary    Improving R great  toe bunion area callus now, scab is resolving. No sign of secondary infection. Skin underneath is normal and healthy, absolutely no concern for a more pervasive condition such as a gangrene or other serious tissue damage. - Other callus benign appearing on plantar aspect  Plan: 1. Again reassurance, continue current plan, Mupirocin BID 1 more week to protect scab and healing skin, may use other skin protectorate in future, encouraged to use regular moisturizer for  calluses 2. May soak other calluses in water with epsom salts, may use pumice stone or other tool to reduce excessive callus 3. May need podiatry in future if worsening 4. Improve leg elevation as instructed last time 5. Follow-up PRN      Hospice care patient    Treated acute issue today in follow-up for R foot calluses, now healing Defer primary management of hospice care to primary attending         Meds ordered this encounter  Medications  . mupirocin ointment (BACTROBAN) 2 %    Follow up plan: Return in about 3 months (around 09/01/2016), or if symptoms worsen or fail to improve.  Nobie Putnam, [facility] 06/02/2016, 12:39 AM

## 2016-06-01 NOTE — Patient Instructions (Addendum)
Thank you for coming to the clinic today.  For the callus on R foot,   Keep up the good work  [employer] the antibiotic ointment twice daily for 1 more week until scab falls off and skin underneath heals. If need more can call or ask for refill.  After scab heals, can use vaseline or other ointment / daily moisturizer  Continue to work on the callus on BOTTOM of foot, may soak with epsom salts, can use pumice stone or other device, be gentle  - IF you get worsening pain, redness, swelling, fevers/chills this may have turned into a skin infection  Try to avoid friction and pressure on these calluses  Follow-up as needed  If you have any other questions or concerns, please feel free to call the clinic or send a message through [place]. You may also schedule an earlier appointment if necessary.  Nobie Putnam, DO [place]

## 2016-06-02 NOTE — Assessment & Plan Note (Signed)
Improving R great toe bunion area callus now, scab is resolving. No sign of secondary infection. Skin underneath is normal and healthy, absolutely no concern for a more pervasive condition such as a gangrene or other serious tissue damage. - Other callus benign appearing on plantar aspect  Plan: 1. Again reassurance, continue current plan, Mupirocin BID 1 more week to protect scab and healing skin, may use other skin protectorate in future, encouraged to use regular moisturizer for calluses 2. May soak other calluses in water with epsom salts, may use pumice stone or other tool to reduce excessive callus 3. May need podiatry in future if worsening 4. Improve leg elevation as instructed last time 5. Follow-up PRN

## 2016-06-02 NOTE — Assessment & Plan Note (Signed)
Treated acute issue today in follow-up for R foot calluses, now healing Defer primary management of hospice care to primary attending

## 2016-06-15 ENCOUNTER — Ambulatory Visit (INDEPENDENT_AMBULATORY_CARE_PROVIDER_SITE_OTHER): Payer: PPO | Admitting: Nurse Practitioner

## 2016-06-15 ENCOUNTER — Encounter: Payer: Self-pay | Admitting: Nurse Practitioner

## 2016-06-15 VITALS — BP 138/52 | HR 86 | Temp 99.0°F | Ht 59.0 in | Wt 136.2 lb

## 2016-06-15 DIAGNOSIS — B370 Candidal stomatitis: Secondary | ICD-10-CM | POA: Diagnosis not present

## 2016-06-15 DIAGNOSIS — J029 Acute pharyngitis, unspecified: Secondary | ICD-10-CM

## 2016-06-15 LAB — POCT RAPID STREP A (OFFICE): Rapid Strep A Screen: NEGATIVE

## 2016-06-15 MED ORDER — MAGIC MOUTHWASH W/LIDOCAINE: 5.0000 mL | Freq: Four times a day (QID) | ORAL | 0 refills | Status: AC

## 2016-06-15 MED ORDER — FLUCONAZOLE 150 MG PO TABS: 150.0000 mg | ORAL_TABLET | Freq: Every day | ORAL | 0 refills | Status: AC

## 2016-06-15 NOTE — Patient Instructions (Signed)
Kathryn Ruiz, Thank you for coming in to clinic today.  1. You have thrush of your mouth and back of your throat. - Take diflucan 150 mg once daily for 2 days. - Swish, gargle, and spit your magic mouthwash up to 4 times per day for 14 days.  Aim for 3 doses every day and use the 4th dose if needed. - Can use chloraseptic spray or lozenges if needed for your sore throat. - Make sure you are continuing to eat and drink enough. -  With your nebulizer, swish gargle and spit with water.   Please schedule a follow-up appointment with Cassell Smiles, AGNP to return as needed for worsening or persistent symptoms.  If you have any other questions or concerns, please feel free to call the clinic or send a message through [place]. You may also schedule an earlier appointment if necessary.  Cassell Smiles, DNP, AGNP-BC Adult Gerontology Nurse Practitioner [facility], [facility]   Oral Kathryn Ruiz, Adult Oral thrush, also called oral candidiasis, is a fungal infection that develops in the mouth and throat and on the tongue. It causes white patches to form on the mouth and tongue. Kathryn Ruiz is most common in older adults, but it can occur at any age. Many cases of thrush are mild, but this infection can also be serious. Kathryn Ruiz can be a repeated (recurrent) problem for certain people who have a weak body defense system (immune system). The weakness can be caused by chronic illnesses, or by taking medicines that limit the body's ability to fight infection. If a person has difficulty fighting infection, the fungus that causes thrush can spread through the body. This can cause life-threatening blood or organ infections. What are the causes? This condition is caused by a fungus (yeast) called Candida albicans.  This fungus is normally present in small amounts in the mouth and on other mucous membranes. It usually causes no harm.  If conditions are present that allow the fungus to grow without control, it  invades surrounding tissues and becomes an infection.  Other Candida species can also lead to thrush (rare). What increases the risk? This condition is more likely to develop in:  People with a weakened immune system.  Older adults.  People with HIV (human immunodeficiency virus).  People with diabetes.  People with dry mouth (xerostomia).  Pregnant women.  People with poor dental care, especially people who have false teeth.  People who use antibiotic medicines. What are the signs or symptoms? Symptoms of this condition can vary from mild and moderate to severe and persistent. Symptoms may include:  A burning feeling in the mouth and throat. This can occur at the start of a thrush infection.  White patches that stick to the mouth and tongue. The tissue around the patches may be red, raw, and painful. If rubbed (during tooth brushing, for example), the patches and the tissue of the mouth may bleed easily.  A bad taste in the mouth or difficulty tasting foods.  A cottony feeling in the mouth.  Pain during eating and swallowing.  Poor appetite.  Cracking at the corners of the mouth. How is this diagnosed? This condition is diagnosed based on:  Physical exam. Your health care provider will look in your mouth.  Health history. Your health care provider will ask you questions about your health. How is this treated? This condition is treated with medicines called antifungals, which prevent the growth of fungi. These medicines are either applied directly to the affected  area (topical) or swallowed (oral). The treatment will depend on the severity of the condition. Mild thrush  Mild cases of thrush may clear up with the use of an antifungal mouth rinse or lozenges. Treatment usually lasts about 14 days. Moderate to severe thrush   More severe thrush infections that have spread to the esophagus are treated with an oral antifungal medicine. A topical antifungal medicine may  also be used.  For some severe infections, treatment may need to continue for more than 14 days.  Oral antifungal medicines are rarely used during pregnancy because they may be harmful to the unborn child. If you are pregnant, talk with your health care provider about options for treatment. Persistent or recurrent thrush  For cases of thrush that do not go away or keep coming back:  Treatment may be needed twice as long as the symptoms last.  Treatment will include both oral and topical antifungal medicines.  People with a weakened immune system can take an antifungal medicine on a continuous basis to prevent thrush infections. It is important to treat conditions that make a person more likely to get thrush, such as diabetes or HIV. Follow these instructions at home: Medicines   Take over-the-counter and prescription medicines only as told by your health care provider.  Talk with your health care provider about an over-the-counter medicine called gentian violet, which kills bacteria and fungi. Relieving soreness and discomfort  To help reduce the discomfort of thrush:  Drink cold liquids such as water or iced tea.  Try flavored ice treats or frozen juices.  Eat foods that are easy to swallow, such as gelatin, ice cream, or custard.  Try drinking from a straw if the patches in your mouth are painful. General instructions   Eat plain, unflavored yogurt as directed by your health care provider. Check the label to make sure the yogurt contains live cultures. This yogurt can help healthy bacteria to grow in the mouth and can stop the growth of the fungus that causes thrush.  If you wear dentures, remove the dentures before going to bed, brush them vigorously, and soak them in a cleaning solution as directed by your health care provider.  Rinse your mouth with a warm salt-water mixture several times a day. To make a salt-water mixture, completely dissolve 1/2-1 tsp of salt in 1 cup of  warm water. Contact a health care provider if:  Your symptoms are getting worse or are not improving within 7 days of starting treatment.  You have symptoms of a spreading infection, such as white patches on the skin outside of the mouth. This information is not intended to replace advice given to you by your health care provider. Make sure you discuss any questions you have with your health care provider. Document Released: 10/07/2003 Document Revised: 10/06/2015 Document Reviewed: 10/06/2015 Elsevier Interactive Patient Education  2017 [employer].

## 2016-06-15 NOTE — Progress Notes (Signed)
I have reviewed this encounter including the documentation in this note and/or discussed this patient with the provider, Cassell Smiles, AGPCNP-BC. I am certifying that I agree with the content of this note as supervising physician.  Nobie Putnam, [facility] 06/15/2016, 8:46 PM

## 2016-06-15 NOTE — Progress Notes (Signed)
Subjective:    Patient ID: Kathryn Ruiz, female    DOB: 12-21-1940, 76 y.o.   MRN: [id number]  Kathryn Ruiz is a 76 y.o. female presenting on 06/15/2016 for Sore Throat (pt concern its strep, headache. afebrile x 4days)   HPI Recent foot pre-ulcerative calluses: Pt states R foot is clearing and "healed up."  Sore Throat Started on Friday.  Has had white patches with thrush in recent past and thought everything was cleared up.  She had not had any white patches or sore throat until 4 days ago.  Upon review of chart, no magic mouthwash or thrush in last 5 months has been documented.  Pt is taking inhaled corticosteroids via nebulizer frequently throughout the day.  She is not currently rinsing her mouth after these administrations.  Hurts when she swallows.  Food consumption is baby food R/t esophageal problems.  Drinking has been more difficult.  Tried drinking coffee - burned with minimal heat.  She felt this pain down her throat as she swallowed.  No worsened cough, f/c/s, not bothered by pollens, no sinus pressure, pain, tooth/jaw pain or other signs of infection.  Social History  Substance Use Topics  . Smoking status: Former Smoker    Packs/day: 3.00    Years: 50.00    Types: Cigarettes    Quit date: 01/26/1999  . Smokeless tobacco: Never Used  . Alcohol use No    Review of Systems Per HPI unless specifically indicated above     Objective:    BP (!) 138/52   Pulse 86   Temp 99 F (37.2 C) (Oral)   Ht 4\' 11"  (1.499 m)   Wt 136 lb 3.2 oz (61.8 kg)   BMI 27.51 kg/m    Wt Readings from Last 3 Encounters:  06/15/16 136 lb 3.2 oz (61.8 kg)  06/01/16 133 lb (60.3 kg)  05/27/16 132 lb (59.9 kg)    Physical Exam  Constitutional: She is oriented to person, place, and time. She appears well-developed and well-nourished. No distress.  On home O2 tank 5 L today.  Using walker for mobility  HENT:  Mouth/Throat: Uvula is midline. No uvula swelling. Posterior  oropharyngeal edema and posterior oropharyngeal erythema present. No oropharyngeal exudate or tonsillar abscesses.  Hard palate, oropharynx and posterior tongue are erythematous. Some tan/white patches at border at top of palate and sides of oropharynx anterior to tonsils. Pt with dentures in place.  Geographic tongue.  Neck: Normal range of motion. Neck supple. No JVD present. No tracheal deviation present. No thyromegaly present.  Cardiovascular: Normal rate, regular rhythm and normal heart sounds.   Pulmonary/Chest: Effort normal. No respiratory distress. She has decreased breath sounds. She has no wheezes. She has no rhonchi. She has no rales.  Lymphadenopathy:    She has cervical adenopathy.  Neurological: She is alert and oriented to person, place, and time.  Skin: Skin is warm and dry.  Psychiatric: She has a normal mood and affect. Her behavior is normal. Judgment and thought content normal.  Vitals reviewed.  Results for orders placed or performed in visit on 04/12/16  POCT rapid strep A  Result Value Ref Range   Rapid Strep A Screen Negative Negative      Assessment & Plan:   Problem List Items Addressed This Visit    None    Visit Diagnoses    Acute sore throat    -  Primary Symptom present x 4 days.  Plan: 1. Pt  believes she may have strep.  Test with rapid strep.   Relevant Orders   POCT rapid strep A (Completed)   Thrush, oral     Acute symptom x 4 days.  Pt on inhaled corticosteroids via nebulizer daily.  Not currently rinsing mouth after administration.  Plan: 1. Take diflucan 150 mg once daily for 2 days. 2. Swish, gargle, and spit magic mouthwash qid for thrush. 3. Use chloraseptic spray or lozenges as needed for pain. 4. Rinse mouth with swish, gargle and spit after every nebulizer treatment.  5. Follow up if symptoms persist > 14 days.    Relevant Medications   fluconazole (DIFLUCAN) 150 MG tablet   magic mouthwash w/lidocaine SOLN      Meds ordered  this encounter  Medications  . OXYGEN    Sig: Inhale 5 L into the lungs.     Follow up plan: Return if symptoms worsen or fail to improve.   Cassell Smiles, DNP, AGPCNP-BC Adult Gerontology Primary Care Nurse Practitioner [facility] 06/15/2016, 10:54 AM

## 2016-06-22 DIAGNOSIS — H353221 Exudative age-related macular degeneration, left eye, with active choroidal neovascularization: Secondary | ICD-10-CM | POA: Diagnosis not present

## 2016-08-10 DIAGNOSIS — H353221 Exudative age-related macular degeneration, left eye, with active choroidal neovascularization: Secondary | ICD-10-CM | POA: Diagnosis not present

## 2016-08-25 ENCOUNTER — Other Ambulatory Visit: Payer: Self-pay | Admitting: Family Medicine

## 2016-08-25 DIAGNOSIS — J432 Centrilobular emphysema: Secondary | ICD-10-CM

## 2016-08-25 MED ORDER — DEXTROMETHORPHAN-GUAIFENESIN 10-100 MG/5ML PO SYRP: ORAL_SOLUTION | ORAL | 3 refills | Status: AC

## 2016-09-13 ENCOUNTER — Other Ambulatory Visit: Payer: Self-pay

## 2016-09-13 MED ORDER — ONDANSETRON HCL 4 MG PO TABS: 4.0000 mg | ORAL_TABLET | ORAL | 2 refills | Status: DC | PRN

## 2016-09-13 MED ORDER — ONDANSETRON HCL 4 MG PO TABS: 4.0000 mg | ORAL_TABLET | ORAL | 2 refills | Status: AC | PRN

## 2016-10-04 ENCOUNTER — Ambulatory Visit (INDEPENDENT_AMBULATORY_CARE_PROVIDER_SITE_OTHER): Payer: Medicare Other | Admitting: Family Medicine

## 2016-10-04 ENCOUNTER — Encounter: Payer: Self-pay | Admitting: Family Medicine

## 2016-10-04 VITALS — BP 143/62 | HR 106 | Temp 97.9°F | Resp 16 | Ht 59.0 in | Wt 138.6 lb

## 2016-10-04 DIAGNOSIS — J432 Centrilobular emphysema: Secondary | ICD-10-CM | POA: Diagnosis not present

## 2016-10-04 DIAGNOSIS — J0110 Acute frontal sinusitis, unspecified: Secondary | ICD-10-CM

## 2016-10-04 DIAGNOSIS — J3089 Other allergic rhinitis: Secondary | ICD-10-CM

## 2016-10-04 DIAGNOSIS — Z515 Encounter for palliative care: Secondary | ICD-10-CM | POA: Diagnosis not present

## 2016-10-04 MED ORDER — AMOXICILLIN-POT CLAVULANATE 875-125 MG PO TABS: 1.0000 | ORAL_TABLET | Freq: Two times a day (BID) | ORAL | 0 refills | Status: AC

## 2016-10-04 MED ORDER — IPRATROPIUM BROMIDE 0.06 % NA SOLN: 2.0000 | Freq: Four times a day (QID) | NASAL | 0 refills | Status: AC

## 2016-10-04 NOTE — Progress Notes (Signed)
Subjective:    Patient ID: Kathryn Ruiz, female    DOB: [birth year], 76 y.o.   MRN: [id number]  Kathryn Ruiz is a 76 y.o. female presenting on 10/04/2016 for Sore Throat (ear pain sinus congestion onset 4 days )  Patient presents for a same day appointment. She is accompanied by her husband, [name].  HPI   SINUSITIS / SORE THROAT / CONGESTION - Reports concern again with sore throat and now more sick symptoms, in past had similar with oral thrush treated most recently 06/15/16 with diflucan, and has had intermittent magic mouthwash per hospice. - Now complains of congestion, cough, sore throat, and sinus pain pressure over past 4 days worsening, no new medicines tried, using liquid Tylenol PRN per hospice, has not tried mucinex for this current episode. No recent antibiotics, has been on variety in past last in 02/2016 it seems with COPD exac. - Admits sinus pain and pressure - PMH on Hospice for End Stage COPD / Chronic resp failure on O2 therapy - Denies any fevers/chills, sweats, nausea vomiting, chest pain, productive cough   Social History  Substance Use Topics  . Smoking status: Former Smoker    Packs/day: 3.00    Years: 50.00    Types: Cigarettes    Quit date: 01/26/1999  . Smokeless tobacco: Never Used  . Alcohol use No    Review of Systems Per HPI unless specifically indicated above     Objective:    BP (!) 143/62   Pulse (!) 106   Temp 97.9 F (36.6 C) (Oral)   Resp 16   Ht 4\' 11"  (1.499 m)   Wt 138 lb 9.6 oz (62.9 kg)   SpO2 90%   BMI 27.99 kg/m   Wt Readings from Last 3 Encounters:  10/04/16 138 lb 9.6 oz (62.9 kg)  06/15/16 136 lb 3.2 oz (61.8 kg)  06/01/16 133 lb (60.3 kg)    Physical Exam  Constitutional: She is oriented to person, place, and time. She appears well-developed and well-nourished. No distress.  Chronically ill and still mildly ill-appearing, tired, cooperative, wheelchair bound  HENT:  Head: Normocephalic and atraumatic.    Bilateral frontal/maxillary sinus mild tender. Bilateral nares with some turbinate edema thick congestion and purulence,   Oropharynx significant generalized dryness of tongue and oral muscoa, poster pharynx still with some persistent erythema with thicker post nasal drainage and tonsillar region L>R exudates, no obvious edema  On O2 5L nasal cannula, pulse  Eyes: Conjunctivae are normal. Right eye exhibits no discharge. Left eye exhibits no discharge.  Neck: Normal range of motion. Neck supple.  Cardiovascular: Regular rhythm, normal heart sounds and intact distal pulses.   No murmur heard. Tachycardic  Pulmonary/Chest: Effort normal. She has no wheezes.  Stable chronic reduced air movement today, without focal wheezing. No coughing. No focal crackles.  Conversational, speaks full sentences. Still some baseline increased work of breathing, overall improved.  Musculoskeletal: Normal range of motion.  Lymphadenopathy:    She has no cervical adenopathy.  Neurological: She is alert and oriented to person, place, and time.  Skin: Skin is warm and dry. No rash noted. She is not diaphoretic.  Psychiatric: Her behavior is normal.  Nursing note and vitals reviewed.      Assessment & Plan:   Problem List Items Addressed This Visit    Hospice care patient - Continue hospice care and symptom management now - If worsening thrush after antibiotics can resume magic mouthwash PRN, or notify  office for Diflucan dose orally    COPD (chronic obstructive pulmonary disease) ([place]) - Lungs without new or worsening wheezing, will treat as sinusitis first, and if not improving then can consider Prednisone or CXR COPD management    Relevant Medications   ipratropium (ATROVENT) 0.06 % nasal spray   Allergic rhinitis   Relevant Medications   ipratropium (ATROVENT) 0.06 % nasal spray    Other Visit Diagnoses    Acute non-recurrent frontal sinusitis    -  Primary  Consistent with acute sinusitis,  likely initially viral URI vs allergic rhinitis component with worsening concern for bacterial infection. Concern with complex hospice patient on O2 with dry oral mucosa at baseline, unable to clear thick congestion at risk for worsening.  Plan: 1. Start Augmentin 875-125mg  PO BID x 10 days 2. Start Atrovent nasal spray decongestant 2 sprays in each nostril up to 4 times daily for 7 days 3. STOP Floanse for now - resume in 1-2 weeks if needed, may be drying sinuses out but not resolving congestion 4. Continue supportive care, inc hydration, may add Mucinex DM as well for 1 week 5. Return criteria reviewed    Relevant Medications   ipratropium (ATROVENT) 0.06 % nasal spray   amoxicillin-clavulanate (AUGMENTIN) 875-125 MG tablet      Meds ordered this encounter  Medications  . ipratropium (ATROVENT) 0.06 % nasal spray    Sig: Place 2 sprays into both nostrils 4 (four) times daily. For up to 5-7 days then stop.    Dispense:  15 mL    Refill:  0  . amoxicillin-clavulanate (AUGMENTIN) 875-125 MG tablet    Sig: Take 1 tablet by mouth 2 (two) times daily. For 10 days    Dispense:  20 tablet    Refill:  0    Follow up plan: Return in about 2 weeks (around 10/18/2016), or if symptoms worsen or fail to improve, for sinusitis.  Nobie Putnam, DO [facility] 10/04/2016, 4:44 PM

## 2016-10-04 NOTE — Patient Instructions (Addendum)
Thank you for coming to the clinic today.  1. It sounds like you have a Sinusitis (Bacterial Infection) - this most likely started as an Upper Respiratory Virus that has settled into an infection. Allergies can also cause this. - Start Augmentin 1 pill twice daily (breakfast and dinner, with food and plenty of water) for 10 days, complete entire course, do not stop early even if feeling better - Start Atrovent nasal spray decongestant 2 sprays in each nostril up to 4 times daily for 7 days - STOP Flonase for 1 week, then may re-start it may be drying up nose - Recommend to ADD OTC Mucinex-DM twice daily for 7 days then STOP  If need can add the magic mouthwash, or call if thrush returns  - Recommend to keep using Nasal Saline spray multiple times a day to help flush out congestion and clear sinuses - Improve hydration by drinking plenty of clear fluids (water, gatorade) to reduce secretions and thin congestion - Congestion draining down throat can cause irritation. May try warm herbal tea with honey, cough drops - Can take Tylenol liquid as needed for fevers  If you develop persistent fever >101F for at least 3 consecutive days, headaches with sinus pain or pressure or persistent earache, please schedule a follow-up evaluation within next few days to week.  Please schedule a Follow-up Appointment to: Return in about 2 weeks (around 10/18/2016), or if symptoms worsen or fail to improve, for sinusitis.  If you have any other questions or concerns, please feel free to call the clinic or send a message through [place]. You may also schedule an earlier appointment if necessary.  Additionally, you may be receiving a survey about your experience at our clinic within a few days to 1 week by e-mail or mail. We value your feedback.  Nobie Putnam, DO [facility]

## 2016-10-25 DEATH — deceased

## 2018-12-03 IMAGING — DX DG ELBOW COMPLETE 3+V*L*
5 series · 5 of 5 positions shown · non-contrast
Comparison: No recent .

CLINICAL DATA: Back pain.  Fall .

EXAM:
LEFT ELBOW - COMPLETE 3+ VIEW

[elbow ap]
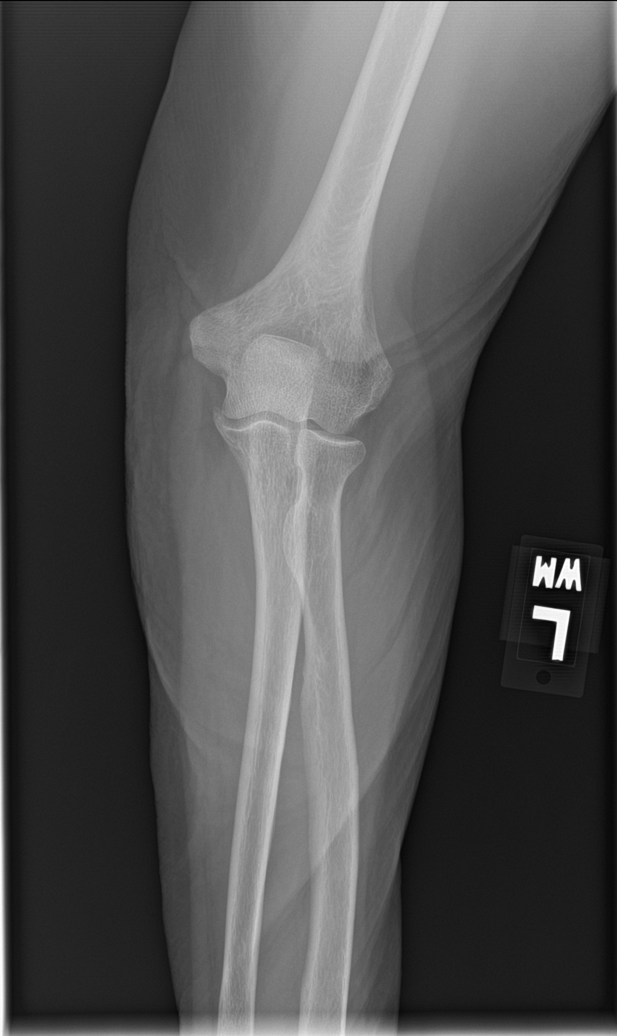

[elbow obl (1 of 3)]
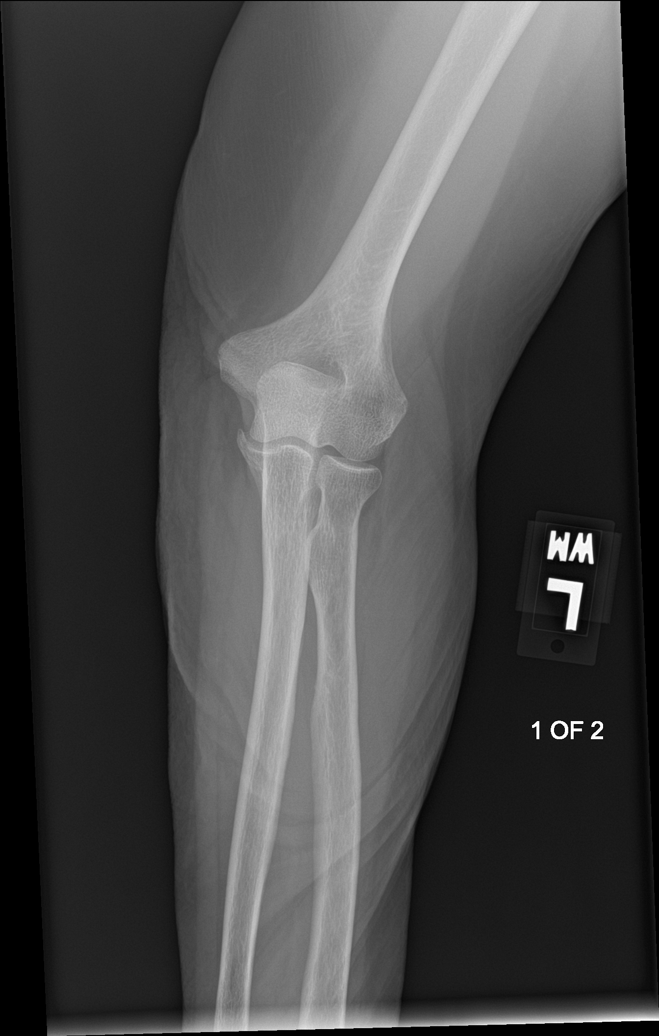

[elbow lat]
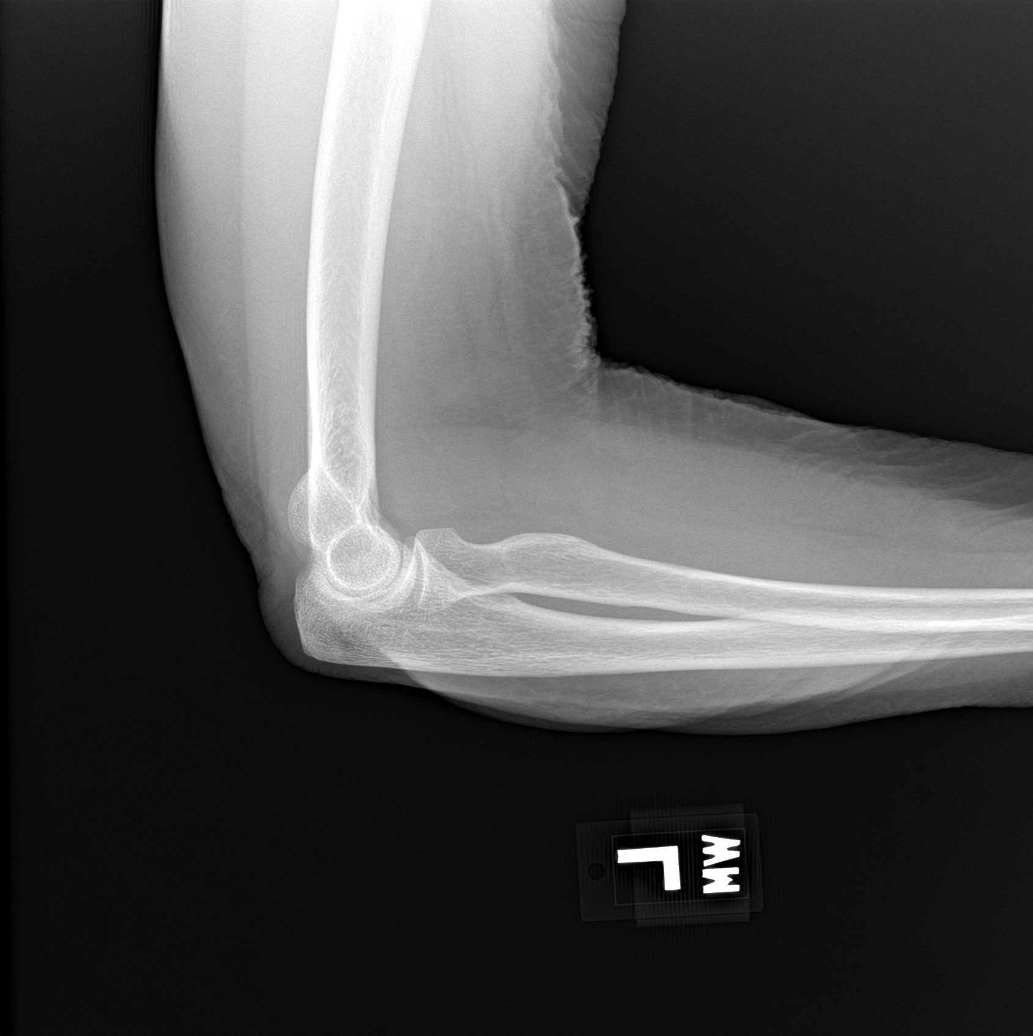

[elbow obl (2 of 3)]
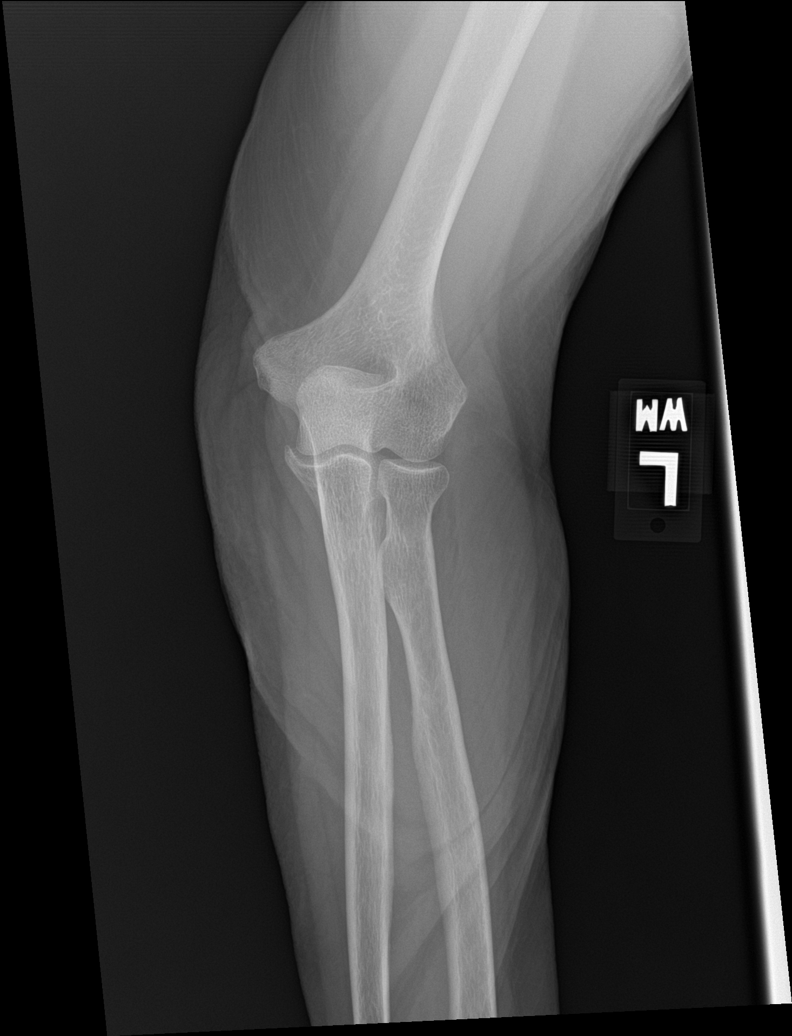

[elbow obl (3 of 3)]
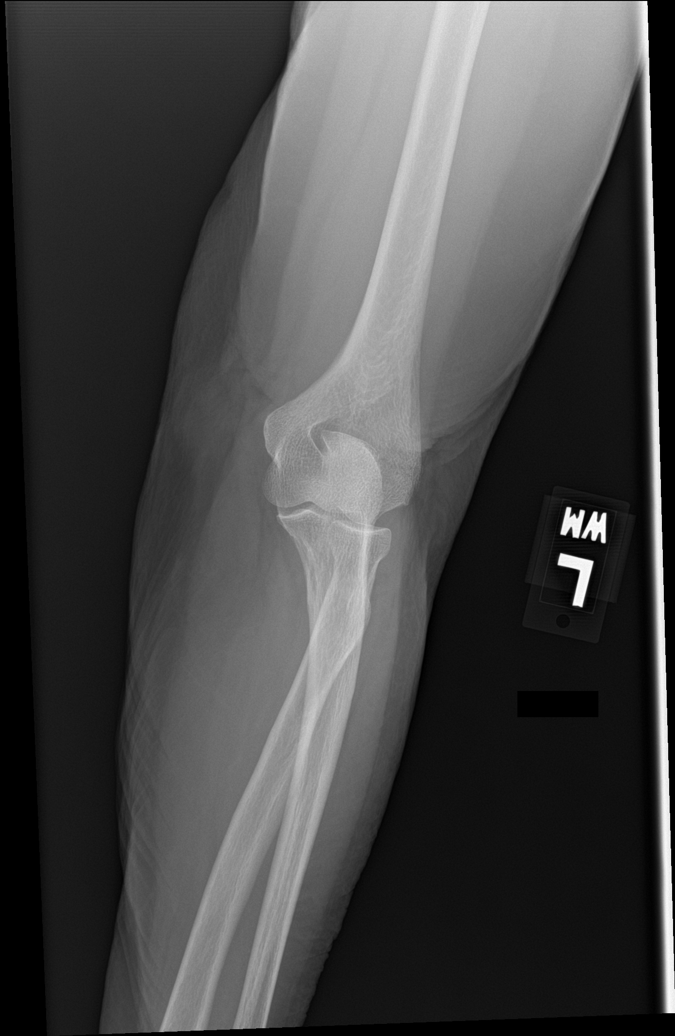

[5 of 5 positions shown; findings below may reference images not displayed]

FINDINGS: Degenerative change left elbow. No evidence of fracture dislocation.
IMPRESSION: Degenerative changes left elbow. No acute bony abnormality
identified.
